# Patient Record
Sex: Female | Born: 1935 | Race: White | Hispanic: No | Marital: Married | Smoking: Former smoker
Health system: Southern US, Community
[De-identification: ages and names within clinical notes are randomized; demographics above are authoritative.]

## PROBLEM LIST (undated history)

## (undated) DIAGNOSIS — E039 Hypothyroidism, unspecified: Secondary | ICD-10-CM

## (undated) DIAGNOSIS — G971 Other reaction to spinal and lumbar puncture: Secondary | ICD-10-CM

## (undated) DIAGNOSIS — C801 Malignant (primary) neoplasm, unspecified: Secondary | ICD-10-CM

## (undated) DIAGNOSIS — R112 Nausea with vomiting, unspecified: Secondary | ICD-10-CM

## (undated) DIAGNOSIS — T4145XA Adverse effect of unspecified anesthetic, initial encounter: Secondary | ICD-10-CM

## (undated) DIAGNOSIS — I10 Essential (primary) hypertension: Secondary | ICD-10-CM

## (undated) DIAGNOSIS — T8859XA Other complications of anesthesia, initial encounter: Secondary | ICD-10-CM

## (undated) DIAGNOSIS — Z9889 Other specified postprocedural states: Secondary | ICD-10-CM

## (undated) DIAGNOSIS — G20A1 Parkinson's disease without dyskinesia, without mention of fluctuations: Secondary | ICD-10-CM

## (undated) DIAGNOSIS — I509 Heart failure, unspecified: Secondary | ICD-10-CM

## (undated) HISTORY — PX: APPENDECTOMY: SHX54

## (undated) HISTORY — PX: ABDOMINAL HYSTERECTOMY: SHX81

## (undated) HISTORY — PX: CHOLECYSTECTOMY: SHX55

## (undated) HISTORY — PX: TONSILLECTOMY: SUR1361

---

## 2001-09-02 HISTORY — PX: BREAST SURGERY: SHX581

## 2001-09-02 HISTORY — PX: FRACTURE SURGERY: SHX138

## 2012-06-09 ENCOUNTER — Ambulatory Visit: Payer: Self-pay | Admitting: Ophthalmology

## 2012-06-09 DIAGNOSIS — I10 Essential (primary) hypertension: Secondary | ICD-10-CM

## 2012-06-22 ENCOUNTER — Ambulatory Visit: Payer: Self-pay | Admitting: Ophthalmology

## 2012-08-07 ENCOUNTER — Ambulatory Visit: Payer: Self-pay | Admitting: Ophthalmology

## 2012-08-14 ENCOUNTER — Ambulatory Visit: Payer: Self-pay | Admitting: Ophthalmology

## 2012-09-02 HISTORY — PX: CATARACT EXTRACTION W/ INTRAOCULAR LENS IMPLANT: SHX1309

## 2012-09-02 HISTORY — PX: BACK SURGERY: SHX140

## 2013-12-31 ENCOUNTER — Observation Stay: Payer: Self-pay | Admitting: Surgery

## 2013-12-31 LAB — URINALYSIS, COMPLETE
BACTERIA: NONE SEEN
Bilirubin,UR: NEGATIVE
Blood: NEGATIVE
Glucose,UR: NEGATIVE mg/dL (ref 0–75)
Ketone: NEGATIVE
Leukocyte Esterase: NEGATIVE
NITRITE: NEGATIVE
PH: 7 (ref 4.5–8.0)
Protein: NEGATIVE
RBC,UR: 1 /HPF (ref 0–5)
SPECIFIC GRAVITY: 1.004 (ref 1.003–1.030)
SQUAMOUS EPITHELIAL: NONE SEEN

## 2013-12-31 LAB — COMPREHENSIVE METABOLIC PANEL
ALT: 28 U/L (ref 12–78)
Albumin: 4 g/dL (ref 3.4–5.0)
Alkaline Phosphatase: 65 U/L
Anion Gap: 3 — ABNORMAL LOW (ref 7–16)
BUN: 14 mg/dL (ref 7–18)
Bilirubin,Total: 1.1 mg/dL — ABNORMAL HIGH (ref 0.2–1.0)
CALCIUM: 8.9 mg/dL (ref 8.5–10.1)
CREATININE: 0.64 mg/dL (ref 0.60–1.30)
Chloride: 108 mmol/L — ABNORMAL HIGH (ref 98–107)
Co2: 30 mmol/L (ref 21–32)
EGFR (African American): >60
Glucose: 96 mg/dL (ref 65–99)
Osmolality: 282 (ref 275–301)
Potassium: 4 mmol/L (ref 3.5–5.1)
SGOT(AST): 31 U/L (ref 15–37)
Sodium: 141 mmol/L (ref 136–145)
Total Protein: 7.4 g/dL (ref 6.4–8.2)

## 2013-12-31 LAB — CBC WITH DIFFERENTIAL/PLATELET
Basophil #: 0 10*3/uL (ref 0.0–0.1)
Basophil %: 0.6 %
Eosinophil #: 0.2 10*3/uL (ref 0.0–0.7)
Eosinophil %: 3.1 %
HCT: 44.5 % (ref 35.0–47.0)
HGB: 14.9 g/dL (ref 12.0–16.0)
LYMPHS ABS: 1.2 10*3/uL (ref 1.0–3.6)
Lymphocyte %: 16.2 %
MCH: 31 pg (ref 26.0–34.0)
MCHC: 33.6 g/dL (ref 32.0–36.0)
MCV: 92 fL (ref 80–100)
MONO ABS: 0.7 x10 3/mm (ref 0.2–0.9)
Monocyte %: 8.7 %
Neutrophil #: 5.4 10*3/uL (ref 1.4–6.5)
Neutrophil %: 71.4 %
PLATELETS: 211 10*3/uL (ref 150–440)
RBC: 4.82 10*6/uL (ref 3.80–5.20)
RDW: 14.1 % (ref 11.5–14.5)
WBC: 7.5 10*3/uL (ref 3.6–11.0)

## 2013-12-31 LAB — TROPONIN I

## 2013-12-31 LAB — LIPASE, BLOOD: LIPASE: 163 U/L (ref 73–393)

## 2014-01-01 LAB — CBC WITH DIFFERENTIAL/PLATELET
BASOS PCT: 1.1 %
Basophil #: 0.1 10*3/uL (ref 0.0–0.1)
Eosinophil #: 0.3 10*3/uL (ref 0.0–0.7)
Eosinophil %: 3.8 %
HCT: 42.8 % (ref 35.0–47.0)
HGB: 13.9 g/dL (ref 12.0–16.0)
LYMPHS ABS: 1.5 10*3/uL (ref 1.0–3.6)
Lymphocyte %: 21.7 %
MCH: 30.2 pg (ref 26.0–34.0)
MCHC: 32.6 g/dL (ref 32.0–36.0)
MCV: 93 fL (ref 80–100)
MONO ABS: 0.7 x10 3/mm (ref 0.2–0.9)
Monocyte %: 10.7 %
NEUTROS ABS: 4.2 10*3/uL (ref 1.4–6.5)
Neutrophil %: 62.7 %
Platelet: 191 10*3/uL (ref 150–440)
RBC: 4.62 10*6/uL (ref 3.80–5.20)
RDW: 13.6 % (ref 11.5–14.5)
WBC: 6.8 10*3/uL (ref 3.6–11.0)

## 2014-01-11 DIAGNOSIS — K631 Perforation of intestine (nontraumatic): Secondary | ICD-10-CM | POA: Insufficient documentation

## 2014-12-20 NOTE — Op Note (Signed)
PATIENT NAME:  Susan Salas, Susan Salas MR#:  [id number] DATE OF BIRTH:  June 25, 1936  DATE OF PROCEDURE:  06/22/2012  PREOPERATIVE DIAGNOSIS:  Cataract, left eye.  POSTOPERATIVE DIAGNOSIS:  Cataract, left eye.  PROCEDURE PERFORMED:  Extracapsular cataract extraction using phacoemulsification with placement of an Alcon SN6CWS, 21.0-diopter posterior chamber lens, serial # [id number].  SURGEON:  Loura Back. Cilicia Borden, MD  ASSISTANT:  None.  ANESTHESIA:  4% lidocaine and 0.75% Marcaine in a 50/50 mixture with 10 units/mL of Hylenex added, given as a peribulbar.  ANESTHESIOLOGIST:  Dr. Andree Elk  COMPLICATIONS:  None.  ESTIMATED BLOOD LOSS:  Less than 1 mL.  DESCRIPTION OF PROCEDURE:  The patient was brought to the operating room and given a peribulbar block.  The patient was then prepped and draped in the usual fashion.  The vertical rectus muscles were imbricated using 5-0 silk sutures.  These sutures were then clamped to the sterile drapes as bridle sutures.  A limbal peritomy was performed extending two clock hours and hemostasis was obtained with cautery.  A partial thickness scleral groove was made at the surgical limbus and dissected anteriorly in a lamellar dissection using an Alcon crescent knife.  The anterior chamber was entered supero-temporally with a Superblade and through the lamellar dissection with a 2.6 mm keratome.  DisCoVisc was used to replace the aqueous and a continuous tear capsulorrhexis was carried out.  Hydrodissection and hydrodelineation were carried out with balanced salt and a 27 gauge canula.  The nucleus was rotated to confirm the effectiveness of the hydrodissection.  Phacoemulsification was carried out using a divide-and-conquer technique.  Total ultrasound time was 1 minute and 48 seconds with an average power of 14.6 percent. CDE 22.22.   Irrigation/aspiration was used to remove the residual cortex.  DisCoVisc was used to inflate the capsule and the internal incision  was enlarged to 3 mm with the crescent knife.  The intraocular lens was folded and inserted into the capsular bag using the AcrySert delivery system.  Irrigation/aspiration was used to remove the residual DisCoVisc.  Miostat was injected into the anterior chamber through the paracentesis track to inflate the anterior chamber and induce miosis.  The wound was checked for leaks and wound leakage was found.  Two sutures were placed with 10-0 across the incision, tied and the knot was rotated superiorly.  The conjunctiva was closed with cautery and the bridle sutures were removed.  Two drops of 0.3% Vigamox were placed on the eye.   An eye shield was placed on the eye.  The patient was discharged to the recovery room in good condition.  ____________________________ Loura Back Sloan Galentine, MD sad:cms D: 06/22/2012 13:34:17 ET T: 06/22/2012 13:45:54 ET JOB#: [id number]  cc: Remo Lipps A. Keaston Pile, MD, <Dictator>  Martie Lee MD ELECTRONICALLY SIGNED 06/29/2012 13:54

## 2014-12-20 NOTE — Op Note (Signed)
PATIENT NAME:  Susan Salas, Susan Salas MR#:  [id number] DATE OF BIRTH:  10-15-1935  DATE OF PROCEDURE:  08/14/2012  PREOPERATIVE DIAGNOSIS:  Cataract, right eye.   POSTOPERATIVE DIAGNOSIS:  Cataract, right eye.  PROCEDURE PERFORMED:  Extracapsular cataract extraction using phacoemulsification with placement of an Alcon SN6CWS, 23.5-diopter posterior chamber lens, serial # [id number].  SURGEON:  Loura Back. Kensy Blizard, MD  ASSISTANT:  None.  ANESTHESIA:  4% lidocaine and 0.75% Marcaine in a 50/50 mixture with 10 units/mL of Hylenex added, given as a peribulbar.  ANESTHESIOLOGIST:  Dr. Kayleen Memos.  COMPLICATIONS:  None.  ESTIMATED BLOOD LOSS:  Less than 1 mL.  DESCRIPTION OF PROCEDURE:  The patient was brought to the operating room and given a peribulbar block.  The patient was then prepped and draped in the usual fashion.  The vertical rectus muscles were imbricated using 5-0 silk sutures.  These sutures were then clamped to the sterile drapes as bridle sutures.  A limbal peritomy was performed extending two clock hours and hemostasis was obtained with cautery.  A partial thickness scleral groove was made at the surgical limbus and dissected anteriorly in a lamellar dissection using an Alcon crescent knife.  The anterior chamber was entered superonasally with a Superblade and through the lamellar dissection with a 2.6 mm keratome.  DisCoVisc was used to replace the aqueous and a continuous tear capsulorrhexis was carried out.  Hydrodissection and hydrodelineation were carried out with balanced salt and a 27 gauge canula.  The nucleus was rotated to confirm the effectiveness of the hydrodissection.  Phacoemulsification was carried out using a divide-and-conquer technique.  Total ultrasound time was 1 minute and 58 seconds with an average power of 14.3 percent. CDE 30.99.  Irrigation/aspiration was used to remove the residual cortex.  DisCoVisc was used to inflate the capsule and the internal incision  was enlarged to 3 mm with the crescent knife.  The intraocular lens was folded and inserted into the capsular bag using the AcrySert delivery system.  Irrigation/aspiration was used to remove the residual DisCoVisc.  Miostat was injected into the anterior chamber through the paracentesis track to inflate the anterior chamber and induce miosis.  The wound was checked for leaks and none were found. The conjunctiva was closed with cautery and the bridle sutures were removed.  Two drops of 0.3% Vigamox were placed on the eye.   An eye shield was placed on the eye.  The patient was discharged to the recovery room in good condition.  ____________________________ Loura Back Levana Minetti, MD sad:cms D: 08/14/2012 11:21:22 ET T: 08/14/2012 11:26:46 ET JOB#: [id number]  cc: Remo Lipps A. Crestina Strike, MD, <Dictator> Martie Lee MD ELECTRONICALLY SIGNED 08/17/2012 13:20

## 2014-12-24 NOTE — Discharge Summary (Signed)
PATIENT NAME:  Susan Salas, PORTNER MR#:  [id number] DATE OF BIRTH:  12-11-1935  DATE OF ADMISSION:  12/31/2013 DATE OF DISCHARGE:  01/03/2014  BRIEF HISTORY: Uzma Hellmer is a 79 year old woman seen in the Emergency Room with increased right lower quadrant pain. The patient has had chronic abdominal pain since she had multiple surgical procedures over the last several years. Prior to this admission, began to develop increasing right lower quadrant, right flank abdominal pain. She had no nausea, vomiting, constitutional symptoms, fever or change in bowel habits. Workup revealed a normal white blood cell count and normal liver function studies. CT scan with contrast revealed what appeared to be contrast outside the small bowel even in the low anterior mesentery. There was no free air. She did not have any peritoneal signs consistent with perforation. She did not have any antecedent event consistent with perforation, and she has had a previous appendectomy. She was admitted to the hospital and placed on IV rehydration, pain management. Had multiple subsequent followup examinations. She continued to have moderate abdominal pain. Her CT scan was repeated on the 2nd without evidence of any free air. She did have a slightly increasing fluid collection around the right colon consistent with perforation, although again no active source could be identified. We did not treat her with antibiotics. We converted her from IV pain medicine to p.o. pain medicine. She remained afebrile and had a normal white blood cell count. She was discharged home on the 4th to be followed in the office in 7 to 10 days' time. Followup the CT scan will be anticipated.   DISCHARGE MEDICATIONS: Include enteric aspirin 81 mg once a day, Cozaar 50 mg once a day, Synthroid 88 mcg once a day, Coreg 10 mg once a day, Lasix 40 mg once a day and acetaminophen/hydrocodone 325/5 every 4 to 6 hours p.r.n.    FINAL DISCHARGE DIAGNOSIS: Abdominal pain.    ____________________________ Micheline Maze, MD rle:gb D: 01/06/2014 20:00:12 ET T: 01/07/2014 03:45:53 ET JOB#: [id number]  cc: Rodena Goldmann III, MD, <Dictator> Rodena Goldmann MD ELECTRONICALLY SIGNED 01/19/2014 0:07

## 2014-12-24 NOTE — H&P (Signed)
PATIENT NAME:  Susan Salas, Susan Salas MR#:  [id number] DATE OF BIRTH:  1935-09-05  DATE OF ADMISSION:  12/31/2013  PRIMARY CARE PHYSICIAN: Jay: Dr. Pat Patrick.   CHIEF COMPLAINT: Right lower quadrant abdominal pain.   BRIEF HISTORY: Susan Salas is a 79 year old woman seen in the Emergency Room with 3 days of increasing right lower quadrant abdominal pain. She has a long-standing history of bilateral lower quadrant abdominal pain, but this pain is worsening and different. The pain is right lower quadrant and up under her right rib cage. The pain increased over the course of the day to where she was unable to tolerate it at home, and she presented to the Emergency Room for further evaluation.   She has a history of hysterectomy, oophorectomy, appendectomy, cholecystectomy, bladder sling, and cervical disk surgery. She had a colonoscopy 2 years ago, which was unremarkable. She saw her regular doctor, who identified a questionable pelvic mass in 2013. She was referred to the GYN oncology service at [facility]. They have been following that mass on subsequent CT scans since she was last evaluated there a year ago. That area has been stable without any significant changes, and the "mass" has disappeared. I do not have any CTs to compare with the current examination.   Currently, she denies any nausea, vomiting, diarrhea, or change in bowel habits. She ate lunch normally today. She denies any fever or chills. She has no other constitutional symptoms other than her abdominal pain. She has been up and active, not confined to bed.   Work-up in the Emergency Room revealed normal white blood cell count and normal electrolytes. CT scan with contrast revealed what appeared to be contrast outside the colon, cecum and small bowel, possibly even into the low anterior mesentery. There are some calcifications noted in that area, which could be contrast. However, there is no free fluid, no  evidence for any free air.   PAST MEDICAL HISTORY:  She denies a history of hepatitis, yellow jaundice, pancreatitis, peptic ulcer disease, or diverticulitis. She does have a history of some cardiac disease, although she denies palpitations or myocardial infarction. She relates that she has a "touch of heart failure." The etiology is unclear and has not been worked up to my knowledge. She has hypertension and hypothyroidism. She is not diabetic.   CURRENT MEDICATIONS: Include aspirin 81 mg p.o. daily, Coreg 10 mg once a day, Cozaar 50 mg once a day, Lasix 40 mg once a day, multivitamins, Synthroid 88 mcg once a day, and vitamin C.   ALLERGIES: SHE IS ALLERGIC TO GENERIC MEDICATIONS. SHE IS ALSO ALLERGIC TO FOSAMAX, BONIVA, MORPHINE, PHENYLEPHRINE, STATINS, AND SULFA DRUGS.   FAMILY HISTORY: Noncontributory.   REVIEW OF SYSTEMS: Otherwise unremarkable.   PHYSICAL EXAMINATION: GENERAL: She is an alert, pleasant, comfortable woman lying quietly at rest without significant pain at the present time. Most of her discomfort is in the right lower quadrant.  VITAL SIGNS: Blood pressure is 170/80. Heart rate is 68 and regular. Oxygen saturation is normal. She is afebrile.  HEENT: No scleral icterus. No pupillary abnormalities. No facial deformities.  NECK: Supple, nontender, with midline trachea and no adenopathy.  CHEST: Clear with no adventitious sounds, and she has normal pulmonary excursion.  CARDIAC: Reveals no murmurs or gallops to my ear. She seems to be in normal sinus rhythm.  ABDOMEN: Generally soft without any significant abdominal tenderness. Distracting her with conversation, she does not react to right lower quadrant palpation.  When asked about her pain, she points to the right lower quadrant and the right upper quadrant. She is not guarding, and there is no rebound. No hernias are noted. She has active bowel sounds.  EXTREMITIES: Lower extremity exam reveals full range of motion, no  significant deformities, and good distal pulses.  PSYCHIATRIC: Normal orientation, normal affect.   I independently reviewed her CT scan. There certainly is evidence of contrast density material outside the colon into the low anterior mesentery. There is no free air. There is no free fluid. She has some calcifications along her abdominal wall, which she relates to her previous surgery. I could not identify any pelvic masses. The omentum does appear thickened in that area.   ASSESSMENT AND PLAN: She certainly does not look like a bowel perforation, does not have any clinical presentation consistent with bowel perforation. She does not have any history of incident that would create a bowel perforation. She had a colonoscopy 2 years ago, so it unlikely she could grow a tumor in that period of time that might perforate her bowel. She had a previous appendectomy. She has no rebound, no guarding, no peritoneal signs. Her major complaint is increasing abdominal pain, but she does have a history of chronic pain on that side, although this current episode is worse. We will plan to admit her to the hospital for observation and pain control and see how she does over the next 24 hours. We will repeat her blood work and will repeat plain films looking for possible free air. At the present time, I do not see any surgical indications. I have discussed this plan with the patient and her husband in detail. Should surgery be considered, we may want to consider transfer to [facility] since all of her previous procedures have been done at [facility].   ____________________________ Micheline Maze, MD rle:jcm D: 12/31/2013 20:12:30 ET T: 12/31/2013 21:00:20 ET JOB#: [id number]  cc: Micheline Maze, MD, <Dictator> Rodena Goldmann MD ELECTRONICALLY SIGNED 01/01/2014 20:46

## 2015-04-10 DIAGNOSIS — M48062 Spinal stenosis, lumbar region with neurogenic claudication: Secondary | ICD-10-CM | POA: Insufficient documentation

## 2015-09-03 DIAGNOSIS — C801 Malignant (primary) neoplasm, unspecified: Secondary | ICD-10-CM

## 2015-09-03 HISTORY — PX: COLON SURGERY: SHX602

## 2015-09-03 HISTORY — DX: Malignant (primary) neoplasm, unspecified: C80.1

## 2015-12-13 ENCOUNTER — Ambulatory Visit
Admission: RE | Admit: 2015-12-13 | Discharge: 2015-12-13 | Disposition: A | Discharge status: Home / Self Care | Payer: Medicare Other | Source: Ambulatory Visit | Attending: Unknown Physician Specialty | Admitting: Unknown Physician Specialty

## 2015-12-13 ENCOUNTER — Other Ambulatory Visit: Payer: Self-pay | Admitting: Unknown Physician Specialty

## 2015-12-13 DIAGNOSIS — Z90710 Acquired absence of both cervix and uterus: Secondary | ICD-10-CM | POA: Insufficient documentation

## 2015-12-13 DIAGNOSIS — R1031 Right lower quadrant pain: Secondary | ICD-10-CM | POA: Insufficient documentation

## 2015-12-13 DIAGNOSIS — R933 Abnormal findings on diagnostic imaging of other parts of digestive tract: Secondary | ICD-10-CM | POA: Diagnosis not present

## 2015-12-13 DIAGNOSIS — Z9049 Acquired absence of other specified parts of digestive tract: Secondary | ICD-10-CM | POA: Diagnosis not present

## 2015-12-13 DIAGNOSIS — J9811 Atelectasis: Secondary | ICD-10-CM | POA: Diagnosis not present

## 2015-12-13 DIAGNOSIS — Z9889 Other specified postprocedural states: Secondary | ICD-10-CM | POA: Insufficient documentation

## 2015-12-13 HISTORY — DX: Heart failure, unspecified: I50.9

## 2015-12-13 HISTORY — DX: Essential (primary) hypertension: I10

## 2015-12-13 LAB — POCT I-STAT CREATININE: CREATININE: 0.7 mg/dL (ref 0.44–1.00)

## 2015-12-13 MED ORDER — IOPAMIDOL (ISOVUE-300) INJECTION 61%
100.0000 mL | Freq: Once | INTRAVENOUS | Status: AC | PRN
  Administered 2015-12-13: 100 mL via INTRAVENOUS

## 2016-10-03 DIAGNOSIS — Z79811 Long term (current) use of aromatase inhibitors: Secondary | ICD-10-CM | POA: Insufficient documentation

## 2017-04-28 ENCOUNTER — Encounter
Admission: RE | Admit: 2017-04-28 | Discharge: 2017-04-28 | Disposition: A | Discharge status: Home / Self Care | Payer: Medicare Other | Source: Ambulatory Visit | Attending: Podiatry | Admitting: Podiatry

## 2017-04-28 DIAGNOSIS — Z01818 Encounter for other preprocedural examination: Secondary | ICD-10-CM | POA: Insufficient documentation

## 2017-04-28 HISTORY — DX: Malignant (primary) neoplasm, unspecified: C80.1

## 2017-04-28 HISTORY — DX: Hypothyroidism, unspecified: E03.9

## 2017-04-28 HISTORY — DX: Other reaction to spinal and lumbar puncture: G97.1

## 2017-04-28 HISTORY — DX: Adverse effect of unspecified anesthetic, initial encounter: T41.45XA

## 2017-04-28 HISTORY — DX: Other complications of anesthesia, initial encounter: T88.59XA

## 2017-04-28 HISTORY — DX: Nausea with vomiting, unspecified: R11.2

## 2017-04-28 HISTORY — DX: Other specified postprocedural states: Z98.890

## 2017-04-28 NOTE — Pre-Procedure Instructions (Addendum)
Bo pre-op orders in chart or Epic for pt's PAT visit today.  Called Dr. Rosann Auerbach office and was told that pt has appt today and the orders will be faxed over as soon as they are ready..  CBC and Met B found in EPIC from [place] drawn earlier this month that can be used for upcoming surgery. EKG enclosed from PCP done on 04/11/2017 and comparison EKG from 9/2/2017can be used for upcoming surgery, no change noted on EKG done on 04/11/2017.  Patient remarked that she would like to avoid receiving general anesthesia due to nausea and vomiting afterwards.  Instead patient requests MAC and or block with local anesthesia.

## 2017-04-28 NOTE — Patient Instructions (Signed)
  Your procedure is scheduled on: Friday May 02, 2017. Report to Same Day Surgery. To find out your arrival time please call [phone] between 1PM - 3PM on Thursday May 01, 2017.  Remember: Instructions that are not followed completely may result in serious medical risk, up to and including death, or upon the discretion of your surgeon and anesthesiologist your surgery may need to be rescheduled.    _x___ 1. Do not eat food or drink liquids after midnight. No gum chewing or hard candies.  You can    Drink clear liquids (water, apple juice, gator aide, black coffee or black tea) up to 2 hours prior      To surgery.   ____ 2. No Alcohol for 24 hours before or after surgery.   ____ 3. Bring all medications with you on the day of surgery if instructed.    __x__ 4. Notify your doctor if there is any change in your medical condition     (cold, fever, infections).    _____ 5. No smoking 24 hours prior to surgery.     Do not wear jewelry, make-up, hairpins, clips or nail polish.  Do not wear lotions, powders, or perfumes.   Do not shave 48 hours prior to surgery. Men may shave face and neck.  Do not bring valuables to the hospital.    [facility] is not responsible for any belongings or valuables.               Contacts, dentures or bridgework may not be worn into surgery.  Leave your suitcase in the car. After surgery it may be brought to your room.  For patients admitted to the hospital, discharge time is determined by your treatment team.   Patients discharged the day of surgery will not be allowed to drive home.    Please read over the following fact sheets that you were given:   [facility] Preparing for Surgery  _x___ Take these medicines the morning of surgery with A SIP OF WATER:    1. SYNTHROID   ____ Fleet Enema (as directed)   __x__ Use CHG Soap as directed on instruction sheet  ____ Use inhalers on the day of surgery and bring to hospital day of  surgery  ____ Stop metformin 2 days prior to surgery    ____ Take 1/2 of usual insulin dose the night before surgery and none on the morning of surgery.   ____ Patient stopped aspirin on 04/27/2017.  _x__ Stop Anti-inflammatories such as Advil, Aleve, Ibuprofen, Motrin, Naproxen,  Naprosyn, Goodies powders or aspirin  products. OK to take Tylenol.   ____ Stop supplements until after surgery.    ____ Bring C-Pap to the hospital.

## 2017-04-29 ENCOUNTER — Other Ambulatory Visit: Payer: Self-pay | Admitting: Podiatry

## 2017-05-02 ENCOUNTER — Ambulatory Visit
Admission: RE | Admit: 2017-05-02 | Discharge: 2017-05-02 | Disposition: A | Discharge status: Home / Self Care | Payer: Medicare Other | Source: Ambulatory Visit | Attending: Podiatry | Admitting: Podiatry

## 2017-05-02 ENCOUNTER — Encounter: Payer: Self-pay | Admitting: *Deleted

## 2017-05-02 ENCOUNTER — Encounter
Admission: RE | Disposition: A | Discharge status: Home / Self Care | Payer: Self-pay | Source: Ambulatory Visit | Attending: Podiatry

## 2017-05-02 ENCOUNTER — Ambulatory Visit: Payer: Medicare Other | Admitting: Anesthesiology

## 2017-05-02 DIAGNOSIS — L97529 Non-pressure chronic ulcer of other part of left foot with unspecified severity: Secondary | ICD-10-CM | POA: Diagnosis not present

## 2017-05-02 DIAGNOSIS — Z8719 Personal history of other diseases of the digestive system: Secondary | ICD-10-CM | POA: Insufficient documentation

## 2017-05-02 DIAGNOSIS — Z85828 Personal history of other malignant neoplasm of skin: Secondary | ICD-10-CM | POA: Insufficient documentation

## 2017-05-02 DIAGNOSIS — I10 Essential (primary) hypertension: Secondary | ICD-10-CM | POA: Insufficient documentation

## 2017-05-02 DIAGNOSIS — E785 Hyperlipidemia, unspecified: Secondary | ICD-10-CM | POA: Diagnosis not present

## 2017-05-02 DIAGNOSIS — M48062 Spinal stenosis, lumbar region with neurogenic claudication: Secondary | ICD-10-CM | POA: Diagnosis not present

## 2017-05-02 DIAGNOSIS — Z7982 Long term (current) use of aspirin: Secondary | ICD-10-CM | POA: Diagnosis not present

## 2017-05-02 DIAGNOSIS — E034 Atrophy of thyroid (acquired): Secondary | ICD-10-CM | POA: Diagnosis not present

## 2017-05-02 DIAGNOSIS — Z888 Allergy status to other drugs, medicaments and biological substances status: Secondary | ICD-10-CM | POA: Diagnosis not present

## 2017-05-02 DIAGNOSIS — M810 Age-related osteoporosis without current pathological fracture: Secondary | ICD-10-CM | POA: Insufficient documentation

## 2017-05-02 DIAGNOSIS — M2042 Other hammer toe(s) (acquired), left foot: Secondary | ICD-10-CM | POA: Diagnosis present

## 2017-05-02 DIAGNOSIS — Z885 Allergy status to narcotic agent status: Secondary | ICD-10-CM | POA: Diagnosis not present

## 2017-05-02 DIAGNOSIS — Z882 Allergy status to sulfonamides status: Secondary | ICD-10-CM | POA: Insufficient documentation

## 2017-05-02 DIAGNOSIS — M479 Spondylosis, unspecified: Secondary | ICD-10-CM | POA: Diagnosis not present

## 2017-05-02 DIAGNOSIS — Z87891 Personal history of nicotine dependence: Secondary | ICD-10-CM | POA: Insufficient documentation

## 2017-05-02 DIAGNOSIS — Z79899 Other long term (current) drug therapy: Secondary | ICD-10-CM | POA: Insufficient documentation

## 2017-05-02 DIAGNOSIS — C481 Malignant neoplasm of specified parts of peritoneum: Secondary | ICD-10-CM | POA: Insufficient documentation

## 2017-05-02 DIAGNOSIS — Z90710 Acquired absence of both cervix and uterus: Secondary | ICD-10-CM | POA: Insufficient documentation

## 2017-05-02 HISTORY — PX: AMPUTATION TOE: SHX6595

## 2017-05-02 SURGERY — AMPUTATION, TOE
Anesthesia: General | Site: Foot | Laterality: Left | Wound class: Clean

## 2017-05-02 MED ORDER — CEFAZOLIN SODIUM-DEXTROSE 2-4 GM/100ML-% IV SOLN
2.0000 g | INTRAVENOUS | Status: AC
  Administered 2017-05-02: 2 g via INTRAVENOUS

## 2017-05-02 MED ORDER — LACTATED RINGERS IV SOLN
INTRAVENOUS | Status: DC
  Administered 2017-05-02: 09:00:00 via INTRAVENOUS

## 2017-05-02 MED ORDER — BUPIVACAINE HCL 0.5 % IJ SOLN
INTRAMUSCULAR | Status: DC | PRN
  Administered 2017-05-02: 5 mL

## 2017-05-02 MED ORDER — EPHEDRINE SULFATE 50 MG/ML IJ SOLN
INTRAMUSCULAR | Status: AC
  Filled 2017-05-02: qty 1

## 2017-05-02 MED ORDER — CEFAZOLIN SODIUM-DEXTROSE 2-4 GM/100ML-% IV SOLN
INTRAVENOUS | Status: AC
  Filled 2017-05-02: qty 100

## 2017-05-02 MED ORDER — FENTANYL CITRATE (PF) 100 MCG/2ML IJ SOLN
INTRAMUSCULAR | Status: AC
  Filled 2017-05-02: qty 2

## 2017-05-02 MED ORDER — PROPOFOL 10 MG/ML IV BOLUS
INTRAVENOUS | Status: DC | PRN
  Administered 2017-05-02: 20 mg via INTRAVENOUS

## 2017-05-02 MED ORDER — NEOMYCIN-POLYMYXIN B GU 40-200000 IR SOLN
Status: DC | PRN
  Administered 2017-05-02: 50 mL

## 2017-05-02 MED ORDER — PROPOFOL 500 MG/50ML IV EMUL
INTRAVENOUS | Status: AC
  Filled 2017-05-02: qty 50

## 2017-05-02 MED ORDER — NEOMYCIN-POLYMYXIN B GU 40-200000 IR SOLN
Status: AC
  Filled 2017-05-02: qty 1

## 2017-05-02 MED ORDER — FENTANYL CITRATE (PF) 100 MCG/2ML IJ SOLN
INTRAMUSCULAR | Status: DC | PRN
  Administered 2017-05-02 (×4): 25 ug via INTRAVENOUS

## 2017-05-02 MED ORDER — ONDANSETRON HCL 4 MG/2ML IJ SOLN
INTRAMUSCULAR | Status: DC | PRN
  Administered 2017-05-02: 4 mg via INTRAVENOUS

## 2017-05-02 MED ORDER — FAMOTIDINE 20 MG PO TABS
ORAL_TABLET | ORAL | Status: AC
  Administered 2017-05-02: 20 mg via ORAL
  Filled 2017-05-02: qty 1

## 2017-05-02 MED ORDER — PROPOFOL 10 MG/ML IV BOLUS
INTRAVENOUS | Status: AC
  Filled 2017-05-02: qty 20

## 2017-05-02 MED ORDER — FAMOTIDINE 20 MG PO TABS
20.0000 mg | ORAL_TABLET | Freq: Once | ORAL | Status: AC
  Administered 2017-05-02: 20 mg via ORAL

## 2017-05-02 MED ORDER — ONDANSETRON HCL 4 MG/2ML IJ SOLN
INTRAMUSCULAR | Status: AC
  Filled 2017-05-02: qty 2

## 2017-05-02 MED ORDER — TRAMADOL HCL 50 MG PO TABS: 50.0000 mg | ORAL_TABLET | Freq: Four times a day (QID) | ORAL | 0 refills | Status: AC | PRN

## 2017-05-02 MED ORDER — PROPOFOL 500 MG/50ML IV EMUL
INTRAVENOUS | Status: DC | PRN
  Administered 2017-05-02: 100 ug/kg/min via INTRAVENOUS

## 2017-05-02 MED ORDER — EPHEDRINE SULFATE 50 MG/ML IJ SOLN
INTRAMUSCULAR | Status: DC | PRN
  Administered 2017-05-02 (×2): 10 mg via INTRAVENOUS

## 2017-05-02 SURGICAL SUPPLY — 44 items
BANDAGE ACE 4X5 VEL STRL LF (GAUZE/BANDAGES/DRESSINGS) ×2 IMPLANT
BLADE MED AGGRESSIVE (BLADE) ×2 IMPLANT
BLADE OSC/SAGITTAL MD 5.5X18 (BLADE) ×2 IMPLANT
BLADE SURG 15 STRL LF DISP TIS (BLADE) ×2 IMPLANT
BLADE SURG 15 STRL SS (BLADE) ×2
BLADE SURG MINI STRL (BLADE) ×2 IMPLANT
BNDG ESMARK 4X12 TAN STRL LF (GAUZE/BANDAGES/DRESSINGS) ×2 IMPLANT
BNDG GAUZE 4.5X4.1 6PLY STRL (MISCELLANEOUS) ×2 IMPLANT
CANISTER SUCT 1200ML W/VALVE (MISCELLANEOUS) ×2 IMPLANT
CUFF TOURN 18 STER (MISCELLANEOUS) ×2 IMPLANT
CUFF TOURN DUAL PL 12 NO SLV (MISCELLANEOUS) ×2 IMPLANT
DRAPE FLUOR MINI C-ARM 54X84 (DRAPES) ×2 IMPLANT
DURAPREP 26ML APPLICATOR (WOUND CARE) ×2 IMPLANT
ELECT REM PT RETURN 9FT ADLT (ELECTROSURGICAL) ×2
ELECTRODE REM PT RTRN 9FT ADLT (ELECTROSURGICAL) ×1 IMPLANT
GAUZE PETRO XEROFOAM 1X8 (MISCELLANEOUS) ×2 IMPLANT
GAUZE SPONGE 4X4 12PLY STRL (GAUZE/BANDAGES/DRESSINGS) ×2 IMPLANT
GAUZE STRETCH 2X75IN STRL (MISCELLANEOUS) ×2 IMPLANT
GAUZE XEROFORM 4X4 STRL (GAUZE/BANDAGES/DRESSINGS) ×2 IMPLANT
GLOVE BIO SURGEON STRL SZ7.5 (GLOVE) ×2 IMPLANT
GLOVE INDICATOR 8.0 STRL GRN (GLOVE) ×2 IMPLANT
GOWN STRL REUS W/ TWL LRG LVL3 (GOWN DISPOSABLE) ×2 IMPLANT
GOWN STRL REUS W/TWL LRG LVL3 (GOWN DISPOSABLE) ×2
HANDPIECE VERSAJET DEBRIDEMENT (MISCELLANEOUS) ×2 IMPLANT
KIT RM TURNOVER STRD PROC AR (KITS) ×2 IMPLANT
LABEL OR SOLS (LABEL) ×2 IMPLANT
NEEDLE FILTER BLUNT 18X 1/2SAF (NEEDLE) ×1
NEEDLE FILTER BLUNT 18X1 1/2 (NEEDLE) ×1 IMPLANT
NEEDLE HYPO 25X1 1.5 SAFETY (NEEDLE) ×4 IMPLANT
NS IRRIG 500ML POUR BTL (IV SOLUTION) ×2 IMPLANT
PACK EXTREMITY ARMC (MISCELLANEOUS) ×2 IMPLANT
SOL .9 NS 3000ML IRR  AL (IV SOLUTION) ×1
SOL .9 NS 3000ML IRR UROMATIC (IV SOLUTION) ×1 IMPLANT
SOL PREP PVP 2OZ (MISCELLANEOUS) ×2
SOLUTION PREP PVP 2OZ (MISCELLANEOUS) ×1 IMPLANT
STOCKINETTE STRL 6IN 960660 (GAUZE/BANDAGES/DRESSINGS) ×2 IMPLANT
STRIP CLOSURE SKIN 1/4X4 (GAUZE/BANDAGES/DRESSINGS) ×2 IMPLANT
SUT ETHILON 3-0 FS-10 30 BLK (SUTURE) ×2
SUT ETHILON 4-0 (SUTURE)
SUT ETHILON 4-0 FS2 18XMFL BLK (SUTURE)
SUTURE EHLN 3-0 FS-10 30 BLK (SUTURE) ×1 IMPLANT
SUTURE ETHLN 4-0 FS2 18XMF BLK (SUTURE) IMPLANT
SWAB DUAL CULTURE TRANS RED ST (MISCELLANEOUS) ×2 IMPLANT
SYRINGE 10CC LL (SYRINGE) ×2 IMPLANT

## 2017-05-02 NOTE — Anesthesia Procedure Notes (Signed)
Date/Time: 05/02/2017 10:58 AM Performed by: Darlyne Russian Pre-anesthesia Checklist: Patient identified, Emergency Drugs available, Suction available, Patient being monitored and Timeout performed Patient Re-evaluated:Patient Re-evaluated prior to induction Oxygen Delivery Method: Nasal cannula Placement Confirmation: positive ETCO2

## 2017-05-02 NOTE — Op Note (Signed)
Date of operation: 05/02/2017.  Surgeon: Durward Fortes DPM.  Preoperative diagnosis: Chronic hammertoe with ulceration left fourth toe.  Postoperative diagnosis: Same.  Procedure: Distal amputation left fourth toe.  Anesthesia: Local Mac.  Hemostasis: Pneumatic tourniquet left ankle 250 mmHg.  Estimated blood loss: Less than 5 cc.  Pathology: Distal left fourth toe.  Complications: None apparent.  Operative indications: This is a 81 year old female with a chronic hammertoe causing ulcerations on the tip of her left toe. Conservative care has been unsuccessful and she elects for removal of the tip of her toe.  Operative procedure: Patient was taken to the operating room and placed on the table in the supine position. Following satisfactory sedation the left foot was anesthetized with 5 cc of 0.5% bupivacaine plain around the fourth toe and metatarsal area. A pneumatic tourniquet was applied at the level of the left ankle and the foot was prepped and draped in the usual sterile fashion. The foot was exsanguinated and the tourniquet inflated to 250 mmHg.    Attention was then directed to the distal aspect of the left fourth toe where a fishmouth type incision was made coursing medial to lateral just proximal to the nail plate area. The incision was carried sharply down to the level of the bone and periosteal dissection carried back to the level of the distal interphalangeal joint where the toe was disarticulated and removed in toto. There was noted to be a slight bony prominence along the lateral head of the middle phalanx and this was resected using a ronguer and rasped smooth. The wound was then flushed with copious amounts of sterile saline and closed using 4-0 nylon simple interrupted sutures area Xeroform and a sterile gauze bandage applied. Tourniquet was released. Kerlix and an Ace wrap applied. Patient tolerated the procedure and anesthesia well and was transported to the PACU with vital  signs stable and in good condition.

## 2017-05-02 NOTE — Anesthesia Preprocedure Evaluation (Signed)
Anesthesia Evaluation  Patient identified by MRN, date of birth, ID band Patient awake    Reviewed: Allergy & Precautions, NPO status , Patient's Chart, lab work & pertinent test results  History of Anesthesia Complications (+) PONV, POST - OP SPINAL HEADACHE and history of anesthetic complications  Airway Mallampati: II       Dental  (+) Upper Dentures   Pulmonary neg sleep apnea, neg COPD, former smoker,           Cardiovascular hypertension, Pt. on medications and Pt. on home beta blockers (-) Past MI (-) dysrhythmias (-) Valvular Problems/Murmurs     Neuro/Psych neg Seizures    GI/Hepatic Neg liver ROS, neg GERD  ,  Endo/Other  Hypothyroidism   Renal/GU negative Renal ROS     Musculoskeletal   Abdominal   Peds  Hematology   Anesthesia Other Findings   Reproductive/Obstetrics                             Anesthesia Physical Anesthesia Plan  ASA: III  Anesthesia Plan: General   Post-op Pain Management:    Induction:   PONV Risk Score and Plan: 3 and Ondansetron, Dexamethasone, Midazolam and Propofol infusion  Airway Management Planned: Nasal Cannula and Simple Face Mask  Additional Equipment:   Intra-op Plan:   Post-operative Plan:   Informed Consent: I have reviewed the patients History and Physical, chart, labs and discussed the procedure including the risks, benefits and alternatives for the proposed anesthesia with the patient or authorized representative who has indicated his/her understanding and acceptance.     Plan Discussed with:   Anesthesia Plan Comments:         Anesthesia Quick Evaluation

## 2017-05-02 NOTE — Discharge Instructions (Addendum)
1. Elevate left lower extremity on pillows.  2. Keep the bandage on the left foot clean, dry, and do not remove.  3. Sponge bathe only left lower extremity.  4. Wear surgical shoe on the left foot whenever walking or standing.  5. Take one pain pill, tramadol, every 6 hours as needed for pain.    AMBULATORY SURGERY  DISCHARGE INSTRUCTIONS   1) The drugs that you were given will stay in your system until tomorrow so for the next 24 hours you should not:  A) Drive an automobile B) Make any legal decisions C) Drink any alcoholic beverage   2) You may resume regular meals tomorrow.  Today it is better to start with liquids and gradually work up to solid foods.  You may eat anything you prefer, but it is better to start with liquids, then soup and crackers, and gradually work up to solid foods.   3) Please notify your doctor immediately if you have any unusual bleeding, trouble breathing, redness and pain at the surgery site, drainage, fever, or pain not relieved by medication.    4) Additional Instructions: TAKE A STOOL SOFTENER TWICE A DAY WHILE TAKING NARCOTIC PAIN MEDICINE TO PREVENT CONSTIPATION   Please contact your physician with any problems or Same Day Surgery at [phone], Monday through Friday 6 am to 4 pm, or [place] at [facility] number at [phone].

## 2017-05-02 NOTE — Anesthesia Post-op Follow-up Note (Signed)
Anesthesia QCDR form completed.        

## 2017-05-02 NOTE — H&P (Signed)
  Medical history and physical in the chart was reviewed.  Patient stable for surgery. 

## 2017-05-02 NOTE — Anesthesia Postprocedure Evaluation (Signed)
Anesthesia Post Note  Patient: Susan Salas  Procedure(s) Performed: Procedure(s) (LRB): AMPUTATION TOE/Left 4th mpj/28820 (Left)  Patient location during evaluation: PACU Anesthesia Type: General Level of consciousness: awake and alert Pain management: pain level controlled Vital Signs Assessment: post-procedure vital signs reviewed and stable Respiratory status: spontaneous breathing and respiratory function stable Cardiovascular status: blood pressure returned to baseline and stable Anesthetic complications: no     Last Vitals:  Vitals:   05/02/17 1205 05/02/17 1210  BP:  (!) 143/64  Pulse: 64 64  Resp: 14 15  Temp:    SpO2: 99% 100%    Last Pain:  Vitals:   05/02/17 1148  TempSrc: Temporal                 KEPHART,WILLIAM K

## 2017-05-02 NOTE — Interval H&P Note (Signed)
History and Physical Interval Note:  05/02/2017 9:48 AM  Buena Irish  has presented today for surgery, with the diagnosis of skin ulcer L97.521, hammertoe M20.42  The various methods of treatment have been discussed with the patient and family. After consideration of risks, benefits and other options for treatment, the patient has consented to  Procedure(s): AMPUTATION TOE/Left 4th mpj/28820 (Left) as a surgical intervention .  The patient's history has been reviewed, patient examined, no change in status, stable for surgery.  I have reviewed the patient's chart and labs.  Questions were answered to the patient's satisfaction.     Durward Fortes

## 2017-05-02 NOTE — Transfer of Care (Signed)
Immediate Anesthesia Transfer of Care Note  Patient: Susan Salas  Procedure(s) Performed: Procedure(s): AMPUTATION TOE/Left 4th mpj/28820 (Left)  Patient Location: PACU  Anesthesia Type:General  Level of Consciousness: awake, alert , oriented and patient cooperative  Airway & Oxygen Therapy: Patient Spontanous Breathing and Patient connected to nasal cannula oxygen  Post-op Assessment: Report given to RN and Post -op Vital signs reviewed and stable  Post vital signs: Reviewed and stable  Last Vitals:  Vitals:   05/02/17 1148 05/02/17 1155  BP: 139/72   Pulse: 68 64  Resp: 14 10  Temp: (!) 36 C   SpO2: 100% 100%    Last Pain:  Vitals:   05/02/17 1148  TempSrc: Temporal         Complications: No apparent anesthesia complications

## 2017-05-06 LAB — SURGICAL PATHOLOGY

## 2017-05-29 DIAGNOSIS — S98132A Complete traumatic amputation of one left lesser toe, initial encounter: Secondary | ICD-10-CM | POA: Insufficient documentation

## 2017-09-23 DIAGNOSIS — C481 Malignant neoplasm of specified parts of peritoneum: Secondary | ICD-10-CM | POA: Insufficient documentation

## 2017-12-03 ENCOUNTER — Other Ambulatory Visit: Payer: Self-pay | Admitting: Internal Medicine

## 2017-12-03 DIAGNOSIS — C481 Malignant neoplasm of specified parts of peritoneum: Secondary | ICD-10-CM

## 2017-12-03 DIAGNOSIS — R1031 Right lower quadrant pain: Secondary | ICD-10-CM

## 2017-12-17 ENCOUNTER — Ambulatory Visit
Admission: RE | Admit: 2017-12-17 | Discharge: 2017-12-17 | Disposition: A | Discharge status: Home / Self Care | Payer: Medicare Other | Source: Ambulatory Visit | Attending: Internal Medicine | Admitting: Internal Medicine

## 2017-12-17 DIAGNOSIS — C481 Malignant neoplasm of specified parts of peritoneum: Secondary | ICD-10-CM | POA: Diagnosis present

## 2017-12-17 DIAGNOSIS — Z8543 Personal history of malignant neoplasm of ovary: Secondary | ICD-10-CM | POA: Insufficient documentation

## 2017-12-17 DIAGNOSIS — R1031 Right lower quadrant pain: Secondary | ICD-10-CM | POA: Insufficient documentation

## 2017-12-17 MED ORDER — IOPAMIDOL (ISOVUE-300) INJECTION 61%: 100.0000 mL | Freq: Once | INTRAVENOUS | Status: DC | PRN

## 2017-12-17 MED ORDER — IOHEXOL 300 MG/ML  SOLN
100.0000 mL | Freq: Once | INTRAMUSCULAR | Status: AC | PRN
  Administered 2017-12-17: 100 mL via INTRAVENOUS

## 2018-09-09 ENCOUNTER — Other Ambulatory Visit: Payer: Self-pay | Admitting: Neurology

## 2018-09-09 DIAGNOSIS — R251 Tremor, unspecified: Secondary | ICD-10-CM

## 2018-09-19 ENCOUNTER — Ambulatory Visit: Payer: Medicare Other

## 2018-10-01 ENCOUNTER — Ambulatory Visit
Admission: RE | Admit: 2018-10-01 | Discharge: 2018-10-01 | Disposition: A | Discharge status: Home / Self Care | Payer: Medicare Other | Source: Ambulatory Visit | Attending: Neurology | Admitting: Neurology

## 2018-10-01 DIAGNOSIS — R251 Tremor, unspecified: Secondary | ICD-10-CM | POA: Insufficient documentation

## 2018-11-05 ENCOUNTER — Encounter: Payer: Self-pay | Admitting: Physical Therapy

## 2018-11-05 ENCOUNTER — Ambulatory Visit: Payer: Medicare Other | Attending: Neurology | Admitting: Physical Therapy

## 2018-11-05 ENCOUNTER — Other Ambulatory Visit: Payer: Self-pay

## 2018-11-05 DIAGNOSIS — R2689 Other abnormalities of gait and mobility: Secondary | ICD-10-CM

## 2018-11-05 DIAGNOSIS — R262 Difficulty in walking, not elsewhere classified: Secondary | ICD-10-CM | POA: Diagnosis present

## 2018-11-05 DIAGNOSIS — M6281 Muscle weakness (generalized): Secondary | ICD-10-CM | POA: Diagnosis present

## 2018-11-05 NOTE — Therapy (Signed)
[place] MAIN [facility] SERVICES [address] [place], [place], [address] Phone: [phone]   Fax:  [phone]  Physical Therapy Evaluation  Patient Details  Name: Susan Salas MRN: [id number] Date of Birth: Mar 14, 1936 Referring Provider (PT): shah   Encounter Date: 11/05/2018  PT End of Session - 11/05/18 1535    Visit Number  1    Number of Visits  17    Date for PT Re-Evaluation  12/31/18    PT Start Time  0315    PT Stop Time  0415    PT Time Calculation (min)  60 min    Activity Tolerance  Patient tolerated treatment well;Patient limited by fatigue    Behavior During Therapy  [facility] for tasks assessed/performed       Past Medical History:  Diagnosis Date  . Cancer ([place]) 2017   ovarian  . CHF (congestive heart failure) ([place])   . Complication of anesthesia    hard to wake from general anesthesia, long porcedures nausea and vomiting.  Marland Kitchen Hypertension   . Hypothyroidism   . PONV (postoperative nausea and vomiting)   . Spinal headache    with labor of 6 year old son.    Past Surgical History:  Procedure Laterality Date  . ABDOMINAL HYSTERECTOMY    . AMPUTATION TOE Left 05/02/2017   Procedure: AMPUTATION TOE/Left 4th mpj/28820;  Surgeon: Sharlotte Alamo, DPM;  Location: [facility] ORS;  Service: Podiatry;  Laterality: Left;  . APPENDECTOMY    . BACK SURGERY  2014   lamenectomy  . BREAST SURGERY Bilateral 2003   reduction  . CATARACT EXTRACTION W/ INTRAOCULAR LENS IMPLANT Bilateral 2014  . CHOLECYSTECTOMY    . COLON SURGERY  2017  . FRACTURE SURGERY Right 2003   screws & plates  . TONSILLECTOMY      There were no vitals filed for this visit.   Subjective Assessment - 11/05/18 1524    Subjective  Patient presents with unsteadiness and path deviation.with ambulation.     Patient is accompained by:  Family member    Pertinent History  Patient reports that she began having balance issues 6-7 months ago. She has ovarian cancer and her  joints hurt and she is having unsteadiness. Patient is living with her husband and has not had any falls . She is on a medicine that makes her bones weak. She goes every 3 months to the cancer MD and every 6 months she has a scan. She stopped walking due to path deviation. she has had back surgery 3 years ago.     How long can you stand comfortably?  unlimited    Patient Stated Goals  To be able to walk better.     Currently in Pain?  Yes    Pain Score  5     Pain Location  Hip    Pain Orientation  Right;Left    Pain Descriptors / Indicators  Aching    Pain Type  Chronic pain    Aggravating Factors   nothing    Pain Relieving Factors  nothing    Effect of Pain on Daily Activities  no    Multiple Pain Sites Yes intermittently but none now        [facility] PT Assessment - 11/05/18 1530      Assessment   Medical Diagnosis  balance deficit    Referring Provider (PT) Dr. Manuella Ghazi    Onset Date/Surgical Date  10/05/18    Hand Dominance  Right    Next MD Visit  shah, [place]    Prior Therapy  no      Precautions   Precautions  None      Restrictions   Weight Bearing Restrictions  No      Balance Screen   Has the patient fallen in the past 6 months  No    Has the patient had a decrease in activity level because of a fear of falling?   Yes    Is the patient reluctant to leave their home because of a fear of falling?   No      Home Environment   Living Environment  Private residence    Living Arrangements  Spouse/significant other    Available Help at Discharge  Family    Type of [place] Access  Level entry    [place]  None      Prior Function   Level of Independence  Independent;Independent with basic ADLs;Independent with household mobility without device    Vocation  Retired    Leisure  cards, read      Cognition   Overall Cognitive Status  Within Functional Limits for tasks assessed         PAIN: Patient has pain that is  intermittent and several joints; bilateral hips, thighs, calfs, arms, hands   POSTURE: WFL   PROM/AROM: WFL BUE and BLE  STRENGTH:  Graded on a 0-5 scale Muscle Group Left Right                          Hip Flex 3/5 3/5  Hip Abd 3/5 3/5  Hip Add 2/5 2/5  Hip Ext 2/5 2/5      Knee Flex 4/5 4/5  Knee Ext 4/5 4/5  Ankle DF 4/5 4/5  Ankle PF 4/5 4/5   SENSATION:  No reports of BUE and BLE numbness    FUNCTIONAL MOBILITY: slow and guarded supine to prone and prone to supine   BALANCE: Static Standing Balance  Normal Able to maintain standing balance against maximal resistance   Good Able to maintain standing balance against moderate resistance   Good-/Fair+ Able to maintain standing balance against minimal resistance x  Fair Able to stand unsupported without UE support and without LOB for 1-2 min   Fair- Requires Min A and UE support to maintain standing without loss of balance   Poor+ Requires mod A and UE support to maintain standing without loss of balance   Poor Requires max A and UE support to maintain standing balance without loss    Standing Dynamic Balance  Normal Stand independently unsupported, able to weight shift and cross midline maximally   Good Stand independently unsupported, able to weight shift and cross midline moderately x  Good-/Fair+ Stand independently unsupported, able to weight shift across midline minimally   Fair Stand independently unsupported, weight shift, and reach ipsilaterally, loss of balance when crossing midline   Poor+ Able to stand with Min A and reach ipsilaterally, unable to weight shift   Poor Able to stand with Mod A and minimally reach ipsilaterally, unable to cross midline.       GAIT   Patient ambulates without AD with decreased gait speed and path deviation with head turns  OUTCOME MEASURES: TEST Outcome Interpretation  5 times sit<>stand 21.17 sec >60 yo, >15 sec indicates increased risk for falls  10 meter walk test  .88             m/s <1.0 m/s indicates increased risk for falls; limited community ambulator  Timed up and Go  12.34               sec <14 sec indicates increased risk for falls  6 minute walk test      1080          Feet 1000 feet is community Conservator, museum/gallery Assessment 50/56 <36/56 (100% risk for falls), 37-45 (80% risk for falls); 46-51 (>50% risk for falls); 52-55 (lower risk <25% of falls)       Treatment: Instructed in HEP for corner balance exercise : tandem or modified tandem and head turns in the corner with chair in front for safety and 2- 3 mins x 2 x day         Objective measurements completed on examination: See above findings.              PT Education - 11/05/18 1534    Education Details  plan of care    Person(s) Educated  Patient    Methods  Explanation    Comprehension  Verbalized understanding;Returned demonstration;Need further instruction       PT Short Term Goals - 11/05/18 1639      PT SHORT TERM GOAL #1   Title  Patient will be independent in home exercise program to improve strength/mobility for better functional independence with ADLs.    Time  4    Period  Weeks    Status  New    Target Date  12/03/18      PT SHORT TERM GOAL #2   Title  Patient (> 62 years old) will complete five times sit to stand test in < 15 seconds indicating an increased LE strength and improved balance.    Time  4    Period  Weeks    Status  New    Target Date  12/03/18        PT Long Term Goals - 11/05/18 1647      PT LONG TERM GOAL #1   Title  Patient will increase BLE gross strength to 4+/5 as to improve functional strength for independent gait, increased standing tolerance and increased ADL ability.    Time  8    Period  Weeks    Status  New    Target Date  12/31/18      PT LONG TERM GOAL #2   Title  Patient will report a worst pain of 3/10 on VAS in  thighs           to improve tolerance with ADLs and reduced symptoms with activities.      Time  8    Period  Weeks    Status  New    Target Date  12/31/18      PT LONG TERM GOAL #3   Title  Patient will increase Berg Balance score by > 6 points to demonstrate decreased fall risk during functional activities.    Time  8    Period  Weeks    Status  New    Target Date  12/31/18      PT LONG TERM GOAL #4   Title  Patient will reduce timed up and go to <11 seconds to reduce fall risk and demonstrate improved transfer/gait ability    Time  8    Period  Weeks  Status  New    Target Date  12/31/18             Plan - 11/05/18 1612    Clinical Impression Statement  Patient presents with decreased dynamic standing balance , BLE hip and ankle weakness, decreased gait speed and decreased outcome measures that indicate a falls risk . She will benefit from skilled PT to improve strength and balance and decrease her falls risk.     Personal Factors and Comorbidities  Age;Comorbidity 1;Time since onset of injury/illness/exacerbation    Comorbidities   cancer    Examination-Activity Limitations  Stairs;Carry;Locomotion Level;Squat;Transfers    Examination-Participation Restrictions  Yard Work;Community Activity;Shop    Stability/Clinical Decision Making  Evolving/Moderate complexity    Clinical Decision Making  Moderate    Rehab Potential  Good    PT Frequency  2x / week    PT Duration  8 weeks    PT Treatment/Interventions  Patient/family education;Therapeutic exercise;Balance training;Therapeutic activities;Gait training;Aquatic Therapy    PT Next Visit Plan  neuromuscular re-education, therapeutic exercise    PT Home Exercise Plan  corner balancde    Consulted and Agree with Plan of Care  Patient;Family member/caregiver       Patient will benefit from skilled therapeutic intervention in order to improve the following deficits and impairments:  Pain, Decreased strength, Decreased activity tolerance, Difficulty walking, Decreased endurance, Abnormal gait, Decreased  balance, Decreased mobility, Dizziness  Visit Diagnosis: Other abnormalities of gait and mobility  Difficulty in walking, not elsewhere classified  Muscle weakness (generalized)     Problem List There are no active problems to display for this patient.   [address] , [place] DPT 11/05/2018, 4:52 PM  [place] MAIN [facility] SERVICES [address] [place], [place], [address] Phone: [phone]   Fax:  [phone]  Name: TAWNA ALWIN MRN: [id number] Date of Birth: 10-23-1935

## 2018-11-09 ENCOUNTER — Ambulatory Visit: Payer: Medicare Other | Admitting: Physical Therapy

## 2018-11-12 ENCOUNTER — Ambulatory Visit: Payer: Medicare Other | Admitting: Physical Therapy

## 2018-11-17 ENCOUNTER — Ambulatory Visit: Payer: Medicare Other | Admitting: Physical Therapy

## 2018-11-19 ENCOUNTER — Ambulatory Visit: Payer: Medicare Other | Admitting: Physical Therapy

## 2018-11-24 ENCOUNTER — Ambulatory Visit: Payer: Medicare Other | Admitting: Physical Therapy

## 2018-11-26 ENCOUNTER — Ambulatory Visit: Payer: Medicare Other | Admitting: Physical Therapy

## 2018-12-01 ENCOUNTER — Ambulatory Visit: Payer: Medicare Other | Admitting: Physical Therapy

## 2018-12-03 ENCOUNTER — Ambulatory Visit: Payer: Medicare Other | Admitting: Physical Therapy

## 2018-12-04 ENCOUNTER — Other Ambulatory Visit: Payer: Self-pay | Admitting: Obstetrics and Gynecology

## 2018-12-04 ENCOUNTER — Other Ambulatory Visit (HOSPITAL_COMMUNITY): Payer: Self-pay | Admitting: Obstetrics and Gynecology

## 2018-12-04 DIAGNOSIS — R109 Unspecified abdominal pain: Secondary | ICD-10-CM

## 2018-12-04 DIAGNOSIS — C569 Malignant neoplasm of unspecified ovary: Secondary | ICD-10-CM

## 2018-12-04 DIAGNOSIS — R911 Solitary pulmonary nodule: Secondary | ICD-10-CM

## 2018-12-08 ENCOUNTER — Ambulatory Visit: Payer: Medicare Other | Admitting: Physical Therapy

## 2018-12-10 ENCOUNTER — Ambulatory Visit: Payer: Medicare Other

## 2018-12-15 ENCOUNTER — Ambulatory Visit: Payer: Medicare Other | Admitting: Physical Therapy

## 2018-12-17 ENCOUNTER — Ambulatory Visit: Payer: Medicare Other | Admitting: Physical Therapy

## 2018-12-22 ENCOUNTER — Ambulatory Visit: Payer: Medicare Other

## 2018-12-24 ENCOUNTER — Ambulatory Visit: Payer: Medicare Other | Admitting: Physical Therapy

## 2018-12-29 ENCOUNTER — Ambulatory Visit
Admission: RE | Admit: 2018-12-29 | Discharge: 2018-12-29 | Disposition: A | Discharge status: Home / Self Care | Payer: Medicare Other | Source: Ambulatory Visit | Attending: Obstetrics and Gynecology | Admitting: Obstetrics and Gynecology

## 2018-12-29 ENCOUNTER — Ambulatory Visit: Payer: Medicare Other | Admitting: Physical Therapy

## 2018-12-29 ENCOUNTER — Other Ambulatory Visit: Payer: Self-pay

## 2018-12-29 DIAGNOSIS — C569 Malignant neoplasm of unspecified ovary: Secondary | ICD-10-CM | POA: Diagnosis not present

## 2018-12-29 DIAGNOSIS — R911 Solitary pulmonary nodule: Secondary | ICD-10-CM

## 2018-12-29 DIAGNOSIS — R109 Unspecified abdominal pain: Secondary | ICD-10-CM | POA: Insufficient documentation

## 2018-12-29 LAB — POCT I-STAT CREATININE: Creatinine, Ser: 0.6 mg/dL (ref 0.44–1.00)

## 2018-12-29 MED ORDER — IOHEXOL 300 MG/ML  SOLN
100.0000 mL | Freq: Once | INTRAMUSCULAR | Status: AC | PRN
  Administered 2018-12-29: 100 mL via INTRAVENOUS

## 2018-12-31 ENCOUNTER — Ambulatory Visit: Payer: Medicare Other | Admitting: Physical Therapy

## 2019-01-05 ENCOUNTER — Ambulatory Visit: Payer: Medicare Other | Admitting: Physical Therapy

## 2019-01-07 ENCOUNTER — Ambulatory Visit: Payer: Medicare Other | Admitting: Physical Therapy

## 2019-01-12 ENCOUNTER — Ambulatory Visit: Payer: Medicare Other | Admitting: Physical Therapy

## 2019-01-14 ENCOUNTER — Ambulatory Visit: Payer: Medicare Other | Admitting: Physical Therapy

## 2019-01-19 ENCOUNTER — Ambulatory Visit: Payer: Medicare Other | Admitting: Physical Therapy

## 2019-03-08 ENCOUNTER — Other Ambulatory Visit
Admission: RE | Admit: 2019-03-08 | Discharge: 2019-03-08 | Disposition: A | Discharge status: Home / Self Care | Payer: Medicare Other | Source: Ambulatory Visit | Attending: Ophthalmology | Admitting: Ophthalmology

## 2019-03-08 DIAGNOSIS — H02401 Unspecified ptosis of right eyelid: Secondary | ICD-10-CM | POA: Insufficient documentation

## 2019-03-12 LAB — ACETYLCHOLINE RECEPTOR AB, ALL
Acety choline binding ab: <0.03 nmol/L (ref 0.00–0.24)
Acetylchol Block Ab: 18 % (ref 0–25)
Acetylcholine Modulat Ab: <12 % (ref 0–20)

## 2019-03-17 DIAGNOSIS — G4733 Obstructive sleep apnea (adult) (pediatric): Secondary | ICD-10-CM | POA: Insufficient documentation

## 2019-06-16 ENCOUNTER — Ambulatory Visit: Payer: Medicare Other | Admitting: Physical Therapy

## 2019-06-22 ENCOUNTER — Ambulatory Visit: Payer: Medicare Other | Admitting: Physical Therapy

## 2019-06-28 ENCOUNTER — Ambulatory Visit: Payer: Medicare Other | Admitting: Physical Therapy

## 2019-06-30 ENCOUNTER — Ambulatory Visit: Payer: Medicare Other | Admitting: Physical Therapy

## 2019-07-05 ENCOUNTER — Ambulatory Visit: Payer: Medicare Other | Admitting: Physical Therapy

## 2019-07-07 ENCOUNTER — Ambulatory Visit: Payer: Medicare Other | Admitting: Physical Therapy

## 2019-07-12 ENCOUNTER — Ambulatory Visit: Payer: Medicare Other | Admitting: Physical Therapy

## 2019-07-14 ENCOUNTER — Ambulatory Visit: Payer: Medicare Other | Admitting: Physical Therapy

## 2019-07-19 ENCOUNTER — Ambulatory Visit: Payer: Medicare Other | Admitting: Physical Therapy

## 2019-07-21 ENCOUNTER — Ambulatory Visit: Payer: Medicare Other | Admitting: Physical Therapy

## 2019-07-26 ENCOUNTER — Ambulatory Visit: Payer: Medicare Other | Admitting: Physical Therapy

## 2019-07-28 ENCOUNTER — Ambulatory Visit: Payer: Medicare Other | Admitting: Physical Therapy

## 2019-08-02 ENCOUNTER — Ambulatory Visit: Payer: Medicare Other | Admitting: Physical Therapy

## 2019-08-04 ENCOUNTER — Ambulatory Visit: Payer: Medicare Other | Admitting: Physical Therapy

## 2019-08-09 ENCOUNTER — Ambulatory Visit: Payer: Medicare Other | Admitting: Physical Therapy

## 2019-08-11 ENCOUNTER — Ambulatory Visit: Payer: Medicare Other | Admitting: Physical Therapy

## 2019-09-08 ENCOUNTER — Ambulatory Visit: Payer: Medicare Other | Attending: Internal Medicine

## 2019-09-08 ENCOUNTER — Other Ambulatory Visit: Payer: Self-pay

## 2019-09-08 DIAGNOSIS — M25651 Stiffness of right hip, not elsewhere classified: Secondary | ICD-10-CM | POA: Diagnosis present

## 2019-09-08 DIAGNOSIS — R2689 Other abnormalities of gait and mobility: Secondary | ICD-10-CM | POA: Insufficient documentation

## 2019-09-08 DIAGNOSIS — R2681 Unsteadiness on feet: Secondary | ICD-10-CM | POA: Insufficient documentation

## 2019-09-08 DIAGNOSIS — M25551 Pain in right hip: Secondary | ICD-10-CM | POA: Diagnosis not present

## 2019-09-08 DIAGNOSIS — M6281 Muscle weakness (generalized): Secondary | ICD-10-CM | POA: Diagnosis present

## 2019-09-08 DIAGNOSIS — R262 Difficulty in walking, not elsewhere classified: Secondary | ICD-10-CM | POA: Insufficient documentation

## 2019-09-08 NOTE — Therapy (Signed)
[place] MAIN [facility] SERVICES [address] [place], [place], [address] Phone: [phone]   Fax:  [phone]  Physical Therapy Evaluation  Patient Details  Name: Susan Salas MRN: [id number] Date of Birth: 1936-02-13 Referring Provider (PT): Ramonita Lab, MD   Encounter Date: 09/08/2019  PT End of Session - 09/08/19 1551    Visit Number  1    Number of Visits  17    Date for PT Re-Evaluation  12/01/19    Authorization Type  Medicare    Authorization Time Period  09/08/19-12/01/19    Authorization - Visit Number  1    Authorization - Number of Visits  10    PT Start Time  [id number]    PT Stop Time  [id number]    PT Time Calculation (min)  60 min    Activity Tolerance  Patient tolerated treatment well;No increased pain    Behavior During Therapy  WFL for tasks assessed/performed       Past Medical History:  Diagnosis Date  . Cancer ([place]) 2017   ovarian  . CHF (congestive heart failure) ([place])   . Complication of anesthesia    hard to wake from general anesthesia, long porcedures nausea and vomiting.  Marland Kitchen Hypertension   . Hypothyroidism   . PONV (postoperative nausea and vomiting)   . Spinal headache    with labor of 55 year old son.    Past Surgical History:  Procedure Laterality Date  . ABDOMINAL HYSTERECTOMY    . AMPUTATION TOE Left 05/02/2017   Procedure: AMPUTATION TOE/Left 4th mpj/28820;  Surgeon: Sharlotte Alamo, DPM;  Location: [facility] ORS;  Service: Podiatry;  Laterality: Left;  . APPENDECTOMY    . BACK SURGERY  2014   lamenectomy  . BREAST SURGERY Bilateral 2003   reduction  . CATARACT EXTRACTION W/ INTRAOCULAR LENS IMPLANT Bilateral 2014  . CHOLECYSTECTOMY    . COLON SURGERY  2017  . FRACTURE SURGERY Right 2003   screws & plates  . TONSILLECTOMY      There were no vitals filed for this visit.   Subjective Assessment - 09/08/19 1450    Subjective  Pt presents to PT d/t progressive stiffness of right hip and decline in ambulatory  balance.    Pertinent History  Pt reports 3 years of pain in right hip (and global pain) since onset of estrogen blocking medication s/p CA. She reports >1 years of progressive decline in balance and progressive loss of Rt hip mobility, more recently having difficulty with Rt FABER posturing required for donning/doffing socks/shoes.    How long can you stand comfortably?  Unlimited but has worsening of low back pain and low back fatigue.    How long can you walk comfortably?  Estimated to be about 5 minutes (limited by SOB)    Currently in Pain?  No/denies         [facility] PT Assessment - 09/08/19 0001      Assessment   Medical Diagnosis  Right leg difficulty, weakness, poor stamina    Referring Provider (PT)  Ramonita Lab, MD    Onset Date/Surgical Date  --   2018   Hand Dominance  Right    Next MD Visit  non scheduled    Prior Therapy  Evaluated, but never seen d/t COVID      Precautions   Precautions  None      Restrictions   Weight Bearing Restrictions  No      Balance Screen  Has the patient fallen in the past 6 months  No    Has the patient had a decrease in activity level because of a fear of falling?   Yes    Is the patient reluctant to leave their home because of a fear of falling?   No      Home Environment   Living Environment  Private residence    Living Arrangements  Spouse/significant other    Available Help at Discharge  Family    Type of [place] Access  Level entry    [place]  None      Prior Function   Level of Independence  Independent;Independent with basic ADLs;Independent with household mobility without device    Vocation  Retired      Observation/Other Assessments   Focus on Therapeutic Outcomes (FOTO)   FOTO: 62 (38% impaired)       Sensation   Light Touch  --   Pt reports pedal paresthesias x20m, fluctuates s resolve   Additional Comments  numbness at right distal third of  anterior shin   mildly  reduced Left lower leg; tactile localization intact B     PROM   Right Hip Flexion  114    Right Hip External Rotation   23    Right Hip Internal Rotation   34    Right Hip ABduction  24    Left Hip Flexion  129    Left Hip External Rotation   50    Left Hip Internal Rotation   38    Left Hip ABduction  33      Strength   Right Hip Flexion  5/5   reduced ROM, delayed onset activation   Right Hip Extension  --   deferred to next visit   Right Hip External Rotation   4+/5    Right Hip Internal Rotation  4+/5    Right Hip ABduction  2+/5   horizontal ABDCT: 5/5, delayed onset activation   Right Hip ADduction  5/5   bradykinetic activation   Left Hip Flexion  5/5    Left Hip Extension  --   deferred to next visit   Left Hip External Rotation  5/5    Left Hip Internal Rotation  5/5    Left Hip ABduction  --   horizontal ABDCT: 5/5, delayed onset activation   Left Hip ADduction  5/5   bradykinetic activation   Right Knee Flexion  5/5   subjectively/objectively weaker   Right Knee Extension  4/5   slow activation   Left Knee Flexion  5/5    Left Knee Extension  5/5    Right Ankle Dorsiflexion  4/5   has plantarfascia pain with testing   Right Ankle Plantar Flexion  5/5   manually resisted open chain   Left Ankle Dorsiflexion  4+/5    Left Ankle Plantar Flexion  5/5   manually resisted open chain     Hip Scouring   Findings  Positive    Side  Right    Comments  Posterior joint pain Right; Pain free Left      Bed Mobility   Bed Mobility  Supine to Sit;Sit to Supine    Supine to Sit  Independent    Sit to Supine  Independent      Transfers   Five time sit to stand comments   15   21.17sec  at evaluation in March 2020     6 minute walk test results    Aerobic Endurance Distance Walked  --   deferred until next visit; 1046ft in March 2020     Standardized Balance Assessment   Five times sit to stand comments   15.00   hands-free; standard height   10 Meter Walk   9.45s (1.06 m/s)   11.38sec (0.34m/s) in March 2020     [place] comment:  --   Deferred this visit: 50/56 in March 2020         Objective measurements completed on examination: See above findings.     PT Education - 09/08/19 1550    Education Details  Result of examination; characteristics of Right hip OA and why additional medical workup is favorable.    Person(s) Educated  Patient    Methods  Explanation;Demonstration    Comprehension  Verbalized understanding;Returned demonstration;Need further instruction       PT Short Term Goals - 09/08/19 1607      PT SHORT TERM GOAL #1   Title  Patient will be independent in home exercise program to improve strength/mobility of Right hip for better functional independence with ADLs.    Baseline  no HEP    Time  4    Period  Weeks    Status  New    Target Date  10/06/19      PT SHORT TERM GOAL #2   Title  Patient will complete five times sit to stand test in < 13 seconds indicating an increased LE strength and improved balance.    Baseline  20sec March 2020; 15sec 09/08/19    Time  4    Period  Weeks    Status  New    Target Date  10/06/19      PT SHORT TERM GOAL #3   Title  Patient will increase Rt hip ABDCT to 3+/5; hip flexion, IR, ER to 5/5 (strong and equal to contralateral limb)    Time  4    Period  Weeks    Status  New    Target Date  10/06/19        PT Long Term Goals - 09/08/19 1609      PT LONG TERM GOAL #1   Title  Pt to demonstrate improved BLE power AEB 5xSTS <12sec    Baseline  15sec at eval 09/08/19    Time  8    Period  Weeks    Status  New    Target Date  11/03/19      PT LONG TERM GOAL #2   Title  Pt to demonstrate 6MWT >1357ft to improve ability to perfrom community distance AMB and IADL completion.    Baseline  ~1036ft in March 2020    Time  8    Period  Weeks    Status  New    Target Date  11/03/19      PT LONG TERM GOAL #3   Title  Pt to demonstrate 10MWT at >1.33m/s.     Baseline  0.13m/s 09/08/19    Time  8    Period  Weeks    Status  New    Target Date  11/03/19      PT LONG TERM GOAL #4   Title  Pt to demonstrate 5xSTS <11sec to demonstrate improved power in BLE    Baseline  15sec 09/08/19    Time  12  Period  Weeks    Status  New    Target Date  12/01/19      PT LONG TERM GOAL #5   Title  Pt to demonstrate SLS RLE >15sec to allow for improved gross motor control of the hip in the frontal plane for reduced falls risk and improved motor control in gait.    Time  12    Period  Weeks    Status  New    Target Date  12/01/19             Plan - 09/08/19 1553    Clinical Impression Statement  Pt presenting to OPPT for evaluation of progressive difficulty with RLE, most notably stiffness/weakness of the hip and recent progressive decline in confidence regarding balance. Examination revealing of capsular pattern of ROM loss in the Right hip (25-35%), diffiulty with right leg gross motor activation (bradykinesia), weakness of multiplanar hip muscles, and positive hip grind test, all characteristics of intraartiular hip pathology, consistent with osteoarthritis, however hip has not been medically evaluated, hence other etiology have not ben ruled out at this time. Given pt's ovaraian CA history and history of GI mets, author will discuss recommendation for medical imaging to PCP. Functionally pt has impairment in basic mobility including transfers and AMB AEB results in 10MWT, 5xSTS, however both of these are improved since March 2020, they remain below age matched norms for healthy subjects and continue to imply heightened risk of falls. Pt will benefit from skilled PT services to address deficits outline in this note in order to reduce pt falls risk, improve strength and mobility, and independence with basic mobility required for ADL/IADL.    Personal Factors and Comorbidities  Age;Comorbidity 1;Time since onset of injury/illness/exacerbation    Comorbidities   CA, osteoporosis    Examination-Activity Limitations  Stairs;Carry;Locomotion Level;Squat;Transfers;Dressing;Hygiene/Grooming    Examination-Participation Restrictions  Yard Work;Community Activity;Shop    Stability/Clinical Decision Making  Stable/Uncomplicated    Clinical Decision Making  Low    Rehab Potential  Good    PT Frequency  2x / week    PT Duration  12 weeks    PT Treatment/Interventions  Patient/family education;Therapeutic exercise;Balance training;Therapeutic activities;Gait training;ADLs/Self Care Home Management;Electrical Stimulation;Moist Heat;DME Instruction;Stair training;Functional mobility training;Taping;Passive range of motion;Dry needling;Joint Manipulations    PT Next Visit Plan  [place] progression balance screening; 6MWT; trial Rt hip joint mobilization/distraction; establish HEP for Right hip strength    PT Home Exercise Plan  deferred to visit 2    Consulted and Agree with Plan of Care  Patient       Patient will benefit from skilled therapeutic intervention in order to improve the following deficits and impairments:  Pain, Decreased strength, Decreased activity tolerance, Difficulty walking, Decreased endurance, Abnormal gait, Decreased balance, Decreased mobility, Dizziness  Visit Diagnosis: Pain in right hip  Stiffness of right hip, not elsewhere classified  Unsteadiness on feet     Problem List There are no problems to display for this patient.  4:19 PM, 09/08/19 Etta Grandchild, PT, DPT Physical Therapist - [facility]  Outpatient Physical [place] [phone]     Etta Grandchild 09/08/2019, 4:18 PM  [place] MAIN [facility] SERVICES [address] [place], [place], [address] Phone: [phone]   Fax:  [phone]  Name: Susan Salas MRN: [id number] Date of Birth: [birth year]

## 2019-09-14 ENCOUNTER — Ambulatory Visit: Payer: Medicare Other | Admitting: Physical Therapy

## 2019-09-14 ENCOUNTER — Encounter: Payer: Self-pay | Admitting: Physical Therapy

## 2019-09-14 ENCOUNTER — Other Ambulatory Visit: Payer: Self-pay

## 2019-09-14 DIAGNOSIS — R2681 Unsteadiness on feet: Secondary | ICD-10-CM

## 2019-09-14 DIAGNOSIS — M25551 Pain in right hip: Secondary | ICD-10-CM

## 2019-09-14 DIAGNOSIS — M25651 Stiffness of right hip, not elsewhere classified: Secondary | ICD-10-CM

## 2019-09-14 DIAGNOSIS — R262 Difficulty in walking, not elsewhere classified: Secondary | ICD-10-CM

## 2019-09-14 DIAGNOSIS — M6281 Muscle weakness (generalized): Secondary | ICD-10-CM

## 2019-09-14 DIAGNOSIS — R2689 Other abnormalities of gait and mobility: Secondary | ICD-10-CM

## 2019-09-14 NOTE — Patient Instructions (Signed)
Access Code: [email or web address]  URL: [email or web address]  Date: 09/14/2019  Prepared by: Blanche East   Exercises  Supine Lower Trunk Rotation - 10 reps - 2 sets - 1x daily - 7x weekly  Single Knee to Chest Stretch - 3 reps - 2 sets - 20 sec hold - 1x daily - 7x weekly  Supine Active Straight Leg Raise - 10 reps - 2 sets - 1x daily - 7x weekly  Seated March - 10 reps - 2 sets - 1x daily - 7x weekly  Seated Hip Abduction with Resistance - 10 reps - 2 sets - 1x daily - 7x weekly

## 2019-09-14 NOTE — Therapy (Signed)
[place] MAIN [facility] SERVICES [address] [place], [place], [address] Phone: [phone]   Fax:  [phone]  Physical Therapy Treatment  Patient Details  Name: Susan Salas MRN: [id number] Date of Birth: [birth year] Referring Provider (PT): Ramonita Lab, MD   Encounter Date: 09/14/2019  PT End of Session - 09/14/19 1420    Visit Number  2    Number of Visits  17    Date for PT Re-Evaluation  12/01/19    Authorization Type  Medicare    Authorization Time Period  09/08/19-12/01/19    Authorization - Visit Number  2    Authorization - Number of Visits  10    PT Start Time  [id number]    PT Stop Time  1415    PT Time Calculation (min)  40 min    Activity Tolerance  Patient tolerated treatment well;No increased pain    Behavior During Therapy  WFL for tasks assessed/performed       Past Medical History:  Diagnosis Date  . 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 CHF (congestive heart failure) ([place])   . Complication of anesthesia    hard to wake from general anesthesia, long porcedures nausea and vomiting.  Marland Kitchen Hypertension   . Hypothyroidism   . PONV (postoperative nausea and vomiting)   . Spinal headache    with labor of 82 year old son.    Past Surgical History:  Procedure Laterality Date  . ABDOMINAL HYSTERECTOMY    . AMPUTATION TOE Left 05/02/2017   Procedure: AMPUTATION TOE/Left 4th mpj/28820;  Surgeon: Sharlotte Alamo, DPM;  Location: [facility] ORS;  Service: Podiatry;  Laterality: Left;  . APPENDECTOMY    . BACK SURGERY  2014   lamenectomy  . BREAST SURGERY Bilateral 2003   reduction  . CATARACT EXTRACTION W/ INTRAOCULAR LENS IMPLANT Bilateral 2014  . CHOLECYSTECTOMY    . COLON SURGERY  2017  . FRACTURE SURGERY Right 2003   screws & plates  . TONSILLECTOMY      There were no vitals filed for this visit.  Subjective Assessment - 09/14/19 1340    Subjective  Patient reports doing okay. She denies any pain currently. She reports intermittent  right hip pain which is in the groin and worse at end of the day.    Pertinent History  Pt reports 3 years of pain in right hip (and global pain) since onset of estrogen blocking medication s/p CA. She reports >1 years of progressive decline in balance and progressive loss of Rt hip mobility, more recently having difficulty with Rt FABER posturing required for donning/doffing socks/shoes.    How long can you stand comfortably?  Unlimited but has worsening of low back pain and low back fatigue.    How long can you walk comfortably?  Estimated to be about 5 minutes (limited by SOB)    Currently in Pain?  No/denies           TREATMENT: Patient hooklying: -lumbar trunk rotation x1 min bilaterally with min VCs to avoid painful ROM;  Single knee to chest stretch 20 sec hold x2 reps bilaterally;  Hooklying with green tband around BLE: -hip flexion march 2x10 -hip abduction/ER 2x10 Patient required min-moderate verbal/tactile cues for correct exercise technique including cues to increase AROM to tolerance for better strengthening;   SLR hip flexion x10 bilaterally; Patient had increased difficulty with RLE after 5 repetitions reporting increased pain/discomfort; discontinued  PT provided written HEP for better adherence,  see patient instructions;   Manual therapy:  PT performed long axis distraction to RLE: 20 sec pull, 10 sec rest x8 min; Patient reports good stretch and immediate relief of pain;  PT performed passive RLE single knee to chest stretch 20 sec hold x2 reps; PT performed passive RLE piriformis stretch (modified) 20 sec hold x2 reps; Patient tolerated well with moderate stretch reported but no increase in pain;  PT performed passive RLE circles x5 reps to facilitate better hip flexibility; She reports mild discomfort in groin with increased ROM  Response to treatment: Patient tolerated advanced exercises fair. She does report increased groin discomfort with end range hip  flexion. This was relieved with long axis distraction. Patient reports no pain at end of session and states that she felt better following treatment session;                      PT Education - 09/14/19 1420    Education Details  LE strengthening, HEP    Person(s) Educated  Patient    Methods  Explanation;Verbal cues    Comprehension  Verbalized understanding;Returned demonstration;Verbal cues required;Need further instruction       PT Short Term Goals - 09/08/19 1607      PT SHORT TERM GOAL #1   Title  Patient will be independent in home exercise program to improve strength/mobility of Right hip for better functional independence with ADLs.    Baseline  no HEP    Time  4    Period  Weeks    Status  New    Target Date  10/06/19      PT SHORT TERM GOAL #2   Title  Patient will complete five times sit to stand test in < 13 seconds indicating an increased LE strength and improved balance.    Baseline  20sec March 2020; 15sec 09/08/19    Time  4    Period  Weeks    Status  New    Target Date  10/06/19      PT SHORT TERM GOAL #3   Title  Patient will increase Rt hip ABDCT to 3+/5; hip flexion, IR, ER to 5/5 (strong and equal to contralateral limb)    Time  4    Period  Weeks    Status  New    Target Date  10/06/19        PT Long Term Goals - 09/08/19 1609      PT LONG TERM GOAL #1   Title  Pt to demonstrate improved BLE power AEB 5xSTS <12sec    Baseline  15sec at eval 09/08/19    Time  8    Period  Weeks    Status  New    Target Date  11/03/19      PT LONG TERM GOAL #2   Title  Pt to demonstrate 6MWT >1317ft to improve ability to perfrom community distance AMB and IADL completion.    Baseline  ~1058ft in March 2020    Time  8    Period  Weeks    Status  New    Target Date  11/03/19      PT LONG TERM GOAL #3   Title  Pt to demonstrate 10MWT at >1.49m/s.    Baseline  0.75m/s 09/08/19    Time  8    Period  Weeks    Status  New    Target Date  11/03/19  PT LONG TERM GOAL #4   Title  Pt to demonstrate 5xSTS <11sec to demonstrate improved power in BLE    Baseline  15sec 09/08/19    Time  12    Period  Weeks    Status  New    Target Date  12/01/19      PT LONG TERM GOAL #5   Title  Pt to demonstrate SLS RLE >15sec to allow for improved gross motor control of the hip in the frontal plane for reduced falls risk and improved motor control in gait.    Time  12    Period  Weeks    Status  New    Target Date  12/01/19            Plan - 09/14/19 1420    Clinical Impression Statement  Patient motivated and participated well within session. Instructed patient in LE strengthening and flexibility exercise. She does require min VCs for proper positioning and exercise technique for optimal muscle activation. PT performed long axis distraction with good tolerance. Patient able to exhibit improved AROM and reports less pain at end of session. She would benefit from additional skilled PT Intervention to improve strength, balance and mobility;    Personal Factors and Comorbidities  Age;Comorbidity 1;Time since onset of injury/illness/exacerbation    Comorbidities  CA, osteoporosis    Examination-Activity Limitations  Stairs;Carry;Locomotion Level;Squat;Transfers;Dressing;Hygiene/Grooming    Examination-Participation Restrictions  Yard Work;Community Activity;Shop    Stability/Clinical Decision Making  Stable/Uncomplicated    Rehab Potential  Good    PT Frequency  2x / week    PT Duration  12 weeks    PT Treatment/Interventions  Patient/family education;Therapeutic exercise;Balance training;Therapeutic activities;Gait training;ADLs/Self Care Home Management;Electrical Stimulation;Moist Heat;DME Instruction;Stair training;Functional mobility training;Taping;Passive range of motion;Dry needling;Joint Manipulations    PT Next Visit Plan  Rhomberg progression balance screening; 6MWT; trial Rt hip joint mobilization/distraction; establish HEP for Right  hip strength    PT Home Exercise Plan  deferred to visit 2    Consulted and Agree with Plan of Care  Patient       Patient will benefit from skilled therapeutic intervention in order to improve the following deficits and impairments:  Pain, Decreased strength, Decreased activity tolerance, Difficulty walking, Decreased endurance, Abnormal gait, Decreased balance, Decreased mobility, Dizziness  Visit Diagnosis: Pain in right hip  Stiffness of right hip, not elsewhere classified  Unsteadiness on feet  Other abnormalities of gait and mobility  Difficulty in walking, not elsewhere classified  Muscle weakness (generalized)     Problem List There are no problems to display for this patient.   Ankush Gintz,Azalyn PT, DPT 09/14/2019, 2:25 PM  [place] MAIN [facility] SERVICES [address] [place], [place], [address] Phone: [phone]   Fax:  [phone]  Name: MINAAL BOMMER MRN: [id number] Date of Birth: 10-27-1935

## 2019-09-16 ENCOUNTER — Ambulatory Visit: Payer: Medicare Other | Admitting: Physical Therapy

## 2019-09-21 ENCOUNTER — Ambulatory Visit: Payer: Medicare Other | Admitting: Physical Therapy

## 2019-09-23 ENCOUNTER — Ambulatory Visit: Payer: Medicare Other | Admitting: Physical Therapy

## 2019-09-28 ENCOUNTER — Ambulatory Visit: Payer: Medicare Other | Admitting: Physical Therapy

## 2019-09-30 ENCOUNTER — Ambulatory Visit: Payer: Medicare Other | Admitting: Physical Therapy

## 2019-10-05 ENCOUNTER — Ambulatory Visit: Payer: Medicare Other | Admitting: Physical Therapy

## 2019-10-07 ENCOUNTER — Ambulatory Visit: Payer: Medicare Other | Admitting: Physical Therapy

## 2019-10-12 ENCOUNTER — Ambulatory Visit: Payer: Medicare Other | Admitting: Physical Therapy

## 2019-10-14 ENCOUNTER — Ambulatory Visit: Payer: Medicare Other | Admitting: Physical Therapy

## 2019-10-19 ENCOUNTER — Ambulatory Visit: Payer: Medicare Other | Admitting: Physical Therapy

## 2019-10-21 ENCOUNTER — Ambulatory Visit: Payer: Medicare Other | Admitting: Physical Therapy

## 2019-10-26 ENCOUNTER — Ambulatory Visit: Payer: Medicare Other | Admitting: Physical Therapy

## 2019-10-28 ENCOUNTER — Ambulatory Visit: Payer: Medicare Other | Admitting: Physical Therapy

## 2019-10-29 ENCOUNTER — Other Ambulatory Visit: Payer: Self-pay | Admitting: Obstetrics and Gynecology

## 2019-10-29 DIAGNOSIS — C569 Malignant neoplasm of unspecified ovary: Secondary | ICD-10-CM

## 2019-11-02 ENCOUNTER — Ambulatory Visit: Payer: Medicare Other | Admitting: Physical Therapy

## 2019-11-03 ENCOUNTER — Ambulatory Visit
Admission: RE | Admit: 2019-11-03 | Discharge: 2019-11-03 | Disposition: A | Discharge status: Home / Self Care | Payer: Medicare Other | Source: Ambulatory Visit | Attending: Obstetrics and Gynecology | Admitting: Obstetrics and Gynecology

## 2019-11-03 ENCOUNTER — Other Ambulatory Visit: Payer: Self-pay

## 2019-11-03 DIAGNOSIS — C569 Malignant neoplasm of unspecified ovary: Secondary | ICD-10-CM | POA: Diagnosis not present

## 2019-11-09 ENCOUNTER — Ambulatory Visit: Payer: Medicare Other | Admitting: Physical Therapy

## 2019-11-11 ENCOUNTER — Ambulatory Visit: Payer: Medicare Other | Admitting: Physical Therapy

## 2019-11-16 ENCOUNTER — Ambulatory Visit: Payer: Medicare Other | Admitting: Physical Therapy

## 2019-11-18 ENCOUNTER — Ambulatory Visit: Payer: Medicare Other | Admitting: Physical Therapy

## 2019-11-23 ENCOUNTER — Ambulatory Visit: Payer: Medicare Other | Admitting: Physical Therapy

## 2019-11-25 ENCOUNTER — Ambulatory Visit: Payer: Medicare Other | Admitting: Physical Therapy

## 2019-11-30 ENCOUNTER — Ambulatory Visit: Payer: Medicare Other | Admitting: Physical Therapy

## 2019-12-02 ENCOUNTER — Ambulatory Visit: Payer: Medicare Other | Admitting: Physical Therapy

## 2020-01-24 DIAGNOSIS — G609 Hereditary and idiopathic neuropathy, unspecified: Secondary | ICD-10-CM | POA: Insufficient documentation

## 2020-02-16 ENCOUNTER — Other Ambulatory Visit: Payer: Self-pay

## 2020-02-16 ENCOUNTER — Ambulatory Visit: Payer: Medicare Other | Attending: Internal Medicine

## 2020-02-16 DIAGNOSIS — R2689 Other abnormalities of gait and mobility: Secondary | ICD-10-CM | POA: Diagnosis present

## 2020-02-16 DIAGNOSIS — R2681 Unsteadiness on feet: Secondary | ICD-10-CM | POA: Diagnosis not present

## 2020-02-16 NOTE — Therapy (Addendum)
[place] MAIN [facility] SERVICES [address] [place], [place], [address] Phone: [phone]   Fax:  [phone]  Physical Therapy Evaluation  Patient Details  Name: Susan Salas MRN: [id number] Date of Birth: [birth year] Referring Provider (PT): Ramonita Lab, MD   Encounter Date: 02/16/2020   PT End of Session - 02/16/20 1329    Visit Number 1    Number of Visits 17    Date for PT Re-Evaluation 04/12/20    Authorization Type Evaluation: 02/16/20, FOTO completed    PT Start Time 1308    PT Stop Time 1400    PT Time Calculation (min) 52 min    Equipment Utilized During Treatment Gait belt    Activity Tolerance Patient tolerated treatment well    Behavior During Therapy [facility] for tasks assessed/performed           Past Medical History:  Diagnosis Date  . Cancer ([place]) 2017   ovarian  . CHF (congestive heart failure) ([place])   . Complication of anesthesia    hard to wake from general anesthesia, long porcedures nausea and vomiting.  Marland Kitchen Hypertension   . Hypothyroidism   . PONV (postoperative nausea and vomiting)   . Spinal headache    with labor of 43 year old son.    Past Surgical History:  Procedure Laterality Date  . ABDOMINAL HYSTERECTOMY    . AMPUTATION TOE Left 05/02/2017   Procedure: AMPUTATION TOE/Left 4th mpj/28820;  Surgeon: Sharlotte Alamo, DPM;  Location: [facility] ORS;  Service: Podiatry;  Laterality: Left;  . APPENDECTOMY    . BACK SURGERY  2014   lamenectomy  . BREAST SURGERY Bilateral 2003   reduction  . CATARACT EXTRACTION W/ INTRAOCULAR LENS IMPLANT Bilateral 2014  . CHOLECYSTECTOMY    . COLON SURGERY  2017  . FRACTURE SURGERY Right 2003   screws & plates  . TONSILLECTOMY      There were no vitals filed for this visit.    Subjective Assessment - 02/16/20 1308    Subjective Unsteadiness/imbalance    Pertinent History Pt reports bilateral LE weakness and difficulty with her balance. She has a follow-up appointment with  Dr. Manuella Ghazi in the coming weeks. She has a history of laminectomy approximately 4 years ago in "my entire back." She reports severe muscle pain in BLE when walking but worse on the R side. She complains of "swelling in the top of both legs or maybe its just fat." Per note from Dr. 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 Continues to follow with oncology. She is on letrozole (nonsteroidal aromatase inhibitors), with CA-125 monitored periodically. Has not been able to be quite as active over the [place] pandemic. She was previously seen in this clinic earlier in 2021 for R hip pain. Per PT note at that time pt with 3 years of pain in right hip (and global pain) since onset of estrogen blocking medication s/p CA diagnosis.    How long can you stand comfortably? Unlimited but has worsening of low back pain and low back fatigue.    How long can you walk comfortably? Estimated to be about 1-2 minutes (limited by leg pain)    Diagnostic tests Pt believes that lumbar MRI was approximately 3 years ago    Patient Stated Goals Pt would  like to be able to walk farther    Currently in Pain? No/denies    Pain Score --   Worst: 10/10, Best: 0/10   Pain Location Back    Pain Orientation Right;Left;Lower    Pain Descriptors / Indicators Sharp;Constant    Pain Type Chronic pain    Pain Radiating Towards Into bilateral thighs down to the feet, R first and second toe numbness, cramping in thights    Pain Onset More than a month ago    Pain Frequency Intermittent    Aggravating Factors  Walking, standing, transfers from supine to sitting    Pain Relieving Factors sitting down, "that cream on my legs," laying down    Multiple Pain Sites No              [facility] PT Assessment  - 02/16/20 1323      Assessment   Medical Diagnosis Idiopathic peripheral neuropathy, OA of spine with radiculopathy    Referring Provider (PT) Ramonita Lab, MD    Onset Date/Surgical Date --   2018, approximate   Hand Dominance Right    Next MD Visit Upcoming appointment with neurology    Prior Therapy Pt seen for a few visits early in 2021 but D/C due to concerns over possible blood clot but testing negative      Precautions   Precautions Fall      Restrictions   Weight Bearing Restrictions No      Balance Screen   Has the patient fallen in the past 6 months No    Has the patient had a decrease in activity level because of a fear of falling?  Yes    Is the patient reluctant to leave their home because of a fear of falling?  No      Home Social worker Private residence    Living Arrangements Spouse/significant other    Available Help at Discharge Family    Type of [place] Access Level entry    Sellers - single point;Shower seat - built in;Grab bars - tub/shower;Grab bars - toilet      Prior Function   Level of Independence Independent with basic ADLs;Independent with household mobility without device    Vocation Retired    [employer], plays cards twice/week, go out to eat, used to dance      Cognition   Overall Cognitive Status Within Functional Limits for tasks assessed      Observation/Other Assessments   Focus on Therapeutic Outcomes (FOTO)  55      Sensation   Light Touch --    Additional Comments --      PROM   Right Hip Flexion --                Standardized Balance Assessment   Standardized Balance Assessment Berg Balance Test    Five times sit to stand comments  --    10 Meter Walk --      Berg Balance Test   Sit to Stand Able to stand without using hands and stabilize independently    Standing Unsupported Able to stand safely 2 minutes      Sitting with Back Unsupported but Feet Supported on Floor or Stool Able to sit safely and securely 2 minutes    Stand to Sit Sits safely with minimal use of hands    Transfers Able  to transfer safely, minor use of hands    Standing Unsupported with Eyes Closed Able to stand 10 seconds with supervision    Standing Unsupported with Feet Together Able to place feet together independently and stand 1 minute safely    From Standing, Reach Forward with Outstretched Arm Can reach confidently >25 cm (10")    From Standing Position, Pick up Object from Floor Able to pick up shoe safely and easily    From Standing Position, Turn to Look Behind Over each Shoulder Looks behind from both sides and weight shifts well    Turn 360 Degrees Able to turn 360 degrees safely in 4 seconds or less    Standing Unsupported, Alternately Place Feet on Step/Stool Able to stand independently and safely and complete 8 steps in 20 seconds    Standing Unsupported, One Foot in Front Able to plae foot ahead of the other independently and hold 30 seconds    Standing on One Leg Tries to lift leg/unable to hold 3 seconds but remains standing independently    Total Score 51    Berg comment: --             SUBJECTIVE Chief complaint: unsteadiness and leg pain Onset: Pt reports bilateral LE weakness and difficulty with her balance. She has a follow-up appointment with Dr. Manuella Ghazi in the coming weeks. She has a history of laminectomy approximately 4 years ago in "my entire back." She reports severe muscle pain in BLE when walking but worse on the R side. She complains of "swelling in the top of both legs or maybe its just fat." Per note from Dr. 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  Continues to follow with oncology. She is on letrozole (nonsteroidal aromatase inhibitors), with CA-125 monitored periodically. Has not been able to be quite as active over the [place] pandemic. She was previously seen in this clinic earlier in 2021 for R hip pain. Per PT note at that time pt with 3 years of pain in right hip (and global pain) since onset of estrogen blocking medication s/p CA diagnosis.    OBJECTIVE  MUSCULOSKELETAL: Tremor: Bilateral hands, worse on the R side Bulk: Normal Tone: Normal, no clonus  Posture Forward upper back posture with rounded shoulders and forward head  Gait Decreased step length and self-selected speed. Decreased heel strike noted bilaterally  Strength R/L 4-/4 Hip flexion 4-/4- Hip abduction (seated) 4-/4- Hip adduction (seated) 5/5 Knee extension 5/5 Knee flexion 5/5 Ankle Plantarflexion 5/5 Ankle Dorsiflexion 4/4 Shoulder flexion 4/4 Shoulder abduction 4/4 Elbow flexion 4-/4- Elbow extension Decreased grip strength bilaterally;  NEUROLOGICAL:  Mental Status Patient is oriented to person, place and time.  Recent memory is intact.  Remote memory is intact.  Attention span and concentration are intact.  Expressive speech is intact.  Patient's fund of knowledge is within normal limits for educational level.  Cranial Nerves Visual acuity and visual fields are intact  Extraocular muscles are intact  Facial strength is intact bilaterally  Hearing is normal as tested by gross conversation Shoulder shrug strength is intact   Sensation Decreased RLE sensation at L4 and L5 demartomes. Otherwise grossly intact to light touch bilateral UEs/LEs C2-T2/L2-S2 respectively Proprioception and hot/cold testing deferred on this date.  Reflexes Deferred  Coordination/Cerebellar Deferred   FUNCTIONAL OUTCOME  MEASURES   Results Comments  BERG 51/56 Fall risk, in need of intervention  TUG 9.2 seconds WNL  5TSTS 19.8 seconds Fall risk, in  need of intervention  10 Meter Gait Speed Self-selected: 10.9s = 0.92 m/s; Fastest: 8.9s = 1.12 m/s Self-selected speed below normative values for full community ambulation  ABC Scale 41.9% Low balance confidence  FOTO 55 Predicted improvement to 64     POSTURAL CONTROL TESTS   Modified Clinical Test of Sensory Interaction for Balance    (CTSIB): Deferred   OCULOMOTOR / VESTIBULAR TESTING: Deferred       Objective measurements completed on examination: See above findings.       PT Education - 02/16/20 1329    Education Details Plan of care    Person(s) Educated Patient    Methods Explanation    Comprehension Verbalized understanding            PT Short Term Goals - 02/16/20 1331      PT SHORT TERM GOAL #1   Title Patient will be independent in home exercise program to improve strength, mobility, and balance in order to improve function at home    Baseline --    Time 4    Period Weeks    Status New    Target Date 03/15/20                 PT Long Term Goals - 02/16/20 1429      PT LONG TERM GOAL #1   Title Pt will improve BERG by at least 3 points in order to demonstrate clinically significant improvement in balance.    Baseline 02/16/20: 51/56    Time 8    Period Weeks    Status New    Target Date 04/12/20      PT LONG TERM GOAL #2   Title Pt will improve ABC by at least 13% in order to demonstrate clinically significant improvement in balance confidence.    Baseline 02/16/20: 41.9%    Time 8    Period Weeks    Status New    Target Date 04/12/20      PT LONG TERM GOAL #3   Title Pt will decrease 5TSTS by at least 3 seconds in order to demonstrate clinically significant improvement in LE strength.    Baseline 02/16/20: 19.8s    Time 8    Period Weeks    Status New    Target Date 04/12/20      PT LONG TERM GOAL #4   Title Pt will improve FOTO to at least 64 in order to demonstrate clinically significant improvement in function at home.     Baseline 02/16/20: 55    Time 8    Period Weeks    Status New    Target Date 04/12/20                  Plan - 02/16/20 1330    Clinical Impression Statement Pt is a pleasant 84 year-old female referred for difficulty with balance as well as lumbar OA with radiculopathy. Pt reports that she would like to focus on her balance and leg weakness as it is the most limiting for her. She has an upcoming appointment with neurology and plans to discuss her low back pain. PT examination reveals deficits in balance as identified by BERG of 51/56, self selected gait speed as 0.92 m/s, and 5TSTS of 19.8s. She has low balance confidence with ABC of 41.9%. Will further  investigate her low back OA and radiculopathy at upcoming visits as pt desires. Pt presents with deficits in strength, gait and balance. She will benefit from skilled PT services to address these deficits to decrease risk for falls and improve her function at home.    Personal Factors and Comorbidities Age;Time since onset of injury/illness/exacerbation;Comorbidity 3+    Comorbidities CA, osteoporosis, lumbar stenosis, hypothyroid    Examination-Activity Limitations Stairs;Carry;Locomotion Level;Squat;Transfers;Stand    Examination-Participation Restrictions Yard Work;Community Activity;Shop    Stability/Clinical Decision Making Evolving/Moderate complexity    Clinical Decision Making Moderate    Rehab Potential Fair    PT Frequency 2x / week    PT Duration 8 weeks    PT Treatment/Interventions Patient/family education;Therapeutic exercise;Balance training;Therapeutic activities;Gait training;ADLs/Self Care Home Management;Electrical Stimulation;Moist Heat;DME Instruction;Stair training;Functional mobility training;Taping;Passive range of motion;Dry needling;Joint Manipulations;Aquatic Therapy;Biofeedback;Canalith Repostioning;Cryotherapy;Iontophoresis 4mg /ml Dexamethasone;Neuromuscular re-education;Manual techniques;Vestibular    PT Next  Visit Plan Consider Two or Six Minute Walk Test, initiate LE strengthening and balance activities, at some point address back pain    PT Home Exercise Plan Deferred to next visit    Consulted and Agree with Plan of Care Patient           Patient will benefit from skilled therapeutic intervention in order to improve the following deficits and impairments:  Pain, Decreased strength, Difficulty walking, Abnormal gait, Decreased balance, Decreased activity tolerance  Visit Diagnosis: Unsteadiness on feet - Plan: PT plan of care cert/re-cert  Other abnormalities of gait and mobility - Plan: PT plan of care cert/re-cert     Problem List There are no problems to display for this patient.  Phillips Grout PT, DPT, GCS  Jennings Stirling 02/16/2020, 3:41 PM  [place] MAIN [facility] SERVICES [address] [place], [place], [address] Phone: [phone]   Fax:  [phone]  Name: Susan Salas MRN: [id number] Date of Birth: 05/11/1936

## 2020-02-22 ENCOUNTER — Ambulatory Visit: Payer: Medicare Other

## 2020-02-22 ENCOUNTER — Other Ambulatory Visit: Payer: Self-pay

## 2020-02-22 DIAGNOSIS — R2681 Unsteadiness on feet: Secondary | ICD-10-CM

## 2020-02-22 DIAGNOSIS — R2689 Other abnormalities of gait and mobility: Secondary | ICD-10-CM

## 2020-02-22 NOTE — Therapy (Signed)
[place] MAIN [facility] SERVICES [address] [place], [place], [address] Phone: [phone]   Fax:  [phone]  Physical Therapy Treatment  Patient Details  Name: Susan Salas MRN: [id number] Date of Birth: 01/04/1936 Referring Provider (PT): Ramonita Lab, MD   Encounter Date: 02/22/2020   PT End of Session - 02/22/20 1546    Visit Number 2    Number of Visits 17    Date for PT Re-Evaluation 04/12/20    Authorization Type Evaluation: 02/16/20, FOTO completed    Authorization Time Period 09/08/19-12/01/19    Authorization - Visit Number 3    Authorization - Number of Visits 10    PT Start Time 1536    PT Stop Time 6962    PT Time Calculation (min) 38 min    Equipment Utilized During Treatment Gait belt    Activity Tolerance Patient tolerated treatment well;No increased pain;Patient limited by fatigue    Behavior During Therapy [facility] for tasks assessed/performed           Past Medical History:  Diagnosis Date  . 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 CHF (congestive heart failure) ([place])   . Complication of anesthesia    hard to wake from general anesthesia, long porcedures nausea and vomiting.  Marland Kitchen Hypertension   . Hypothyroidism   . PONV (postoperative nausea and vomiting)   . Spinal headache    with labor of 26 year old son.    Past Surgical History:  Procedure Laterality Date  . ABDOMINAL HYSTERECTOMY    . AMPUTATION TOE Left 05/02/2017   Procedure: AMPUTATION TOE/Left 4th mpj/28820;  Surgeon: Sharlotte Alamo, DPM;  Location: [facility] ORS;  Service: Podiatry;  Laterality: Left;  . APPENDECTOMY    . BACK SURGERY  2014   lamenectomy  . BREAST SURGERY Bilateral 2003   reduction  . CATARACT EXTRACTION W/ INTRAOCULAR LENS IMPLANT Bilateral 2014  . CHOLECYSTECTOMY    . COLON SURGERY  2017  . FRACTURE SURGERY Right 2003   screws & plates  . TONSILLECTOMY      There were no vitals filed for this visit.   Subjective Assessment - 02/22/20 1542      Subjective Pt returns for treatment after recent evaluation. Pt reports no updates since prior session, but did have a new medication to help with pain processsing.    Pertinent History Pt reports bilateral LE weakness and difficulty with her balance. She has a follow-up appointment with Dr. Manuella Ghazi in the coming weeks. She has a history of laminectomy approximately 4 years ago in "my entire back." She reports severe muscle pain in BLE when walking but worse on the R side. She complains of "swelling in the top of both legs or maybe its just fat." Per note from Dr. 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 Continues to follow with oncology. She is on letrozole (nonsteroidal aromatase inhibitors), with CA-125 monitored periodically. Has not been able to be quite as active over the [place] pandemic. She was previously seen in this clinic earlier in 2021 for R hip pain. Per PT note at that time pt with 3 years of pain in right hip (and global pain) since onset of estrogen blocking medication s/p CA diagnosis.    How long can you stand comfortably? Unlimited  but has worsening of low back pain and low back fatigue.    How long can you walk comfortably? Estimated to be about 1-2 minutes (limited by leg pain)    Diagnostic tests Pt believes that lumbar MRI was approximately 3 years ago    Patient Stated Goals Pt would like to be able to walk farther    Currently in Pain? Yes    Pain Score 8     Pain Location --   upper legs         INTERVENTION THIS DATE: -NUSTEP for cardiovascular conditioning 8 minutes, seat 9, arms 9, level 2, SPM cued >80 *Terminal HR: 67bpm, 98%    [facility] Adult PT Treatment/Exercise - 02/22/20 0001      Ambulation/Gait   Ambulation Distance  (Feet) 893 Feet   6MWT   Assistive device None    Gait velocity 0.53m/s    Gait Comments Terminal vitals: 98%SpO2, 72 BPM    Pt reports significant reduction in pain after AMB         HEP exploration, discovery, and assignment:  -STS from chair, 10x hands-free; (effort appears too high, also quads and patella pain)  -STS from chair + airex pad 10x hands free (moderate effort, no pain) -Standing Heel raises in tandem 1x15 bilat, BUE supported  -Semi-Tandem stance balance 2x15sec bilat, hands free in //bars        PT Short Term Goals - 02/16/20 1331      PT SHORT TERM GOAL #1   Title Patient will be independent in home exercise program to improve strength, mobility, and balance in order to improve function at home    Baseline --    Time 4    Period Weeks    Status New    Target Date 03/15/20      PT SHORT TERM GOAL #2   Title --    Baseline --    Time --    Period --    Status --    Target Date --      PT SHORT TERM GOAL #3   Title --    Time --    Period --    Status --    Target Date --             PT Long Term Goals - 02/16/20 1429      PT LONG TERM GOAL #1   Title Pt will improve BERG by at least 3 points in order to demonstrate clinically significant improvement in balance.    Baseline 02/16/20: 51/56    Time 8    Period Weeks    Status New    Target Date 04/12/20      PT LONG TERM GOAL #2   Title Pt will improve ABC by at least 13% in order to demonstrate clinically significant improvement in balance confidence.    Baseline 02/16/20: 41.9%    Time 8    Period Weeks    Status New    Target Date 04/12/20      PT LONG TERM GOAL #3   Title Pt will decrease 5TSTS by at least 3 seconds in order to demonstrate clinically significant improvement in LE strength.    Baseline 02/16/20: 19.8s    Time 8    Period Weeks    Status New    Target Date 04/12/20      PT LONG TERM GOAL #4   Title Pt will improve FOTO to at least 64 in order  to demonstrate  clinically significant improvement in function at home.    Baseline 02/16/20: 55    Time 8    Period Weeks    Status New    Target Date 04/12/20                 Plan - 02/22/20 1547    Clinical Impression Statement Began to establish pt's plan of care to improve BLE strength, dynamic balance, and AMB activity tolerance. Pt arrives with 8/10 pain in legs, but has decrease to 2/10 after Nustep and 6MWT. Pt is noted to have bradycardia in 60s after 8 minutes on Nustep, then low 70s after 6MWT, no current medications to explain this, although it may be her baseline. Of note pt also has some mild flat affect in session, as well as decreased amplitude of trunk movement in gait during walking. Pt has mild tremor in hand, but only noticeable when holding her HEP as the paper amplifies small movements. Pt does well with balance activity in // bars, attests to capacity to perform in a safe context at home.     Personal Factors and Comorbidities Age;Time since onset of injury/illness/exacerbation;Comorbidity 3+    Comorbidities CA, osteoporosis, lumbar stenosis, hypothyroid    Examination-Activity Limitations Stairs;Carry;Locomotion Level;Squat;Transfers;Stand    Examination-Participation Restrictions Yard Work;Community Activity;Shop    Stability/Clinical Decision Making Evolving/Moderate complexity    Clinical Decision Making Moderate    Rehab Potential Fair    PT Frequency 2x / week    PT Duration 8 weeks    PT Treatment/Interventions Patient/family education;Therapeutic exercise;Balance training;Therapeutic activities;Gait training;ADLs/Self Care Home Management;Electrical Stimulation;Moist Heat;DME Instruction;Stair training;Functional mobility training;Taping;Passive range of motion;Dry needling;Joint Manipulations;Aquatic Therapy;Biofeedback;Canalith Repostioning;Cryotherapy;Iontophoresis 4mg /ml Dexamethasone;Neuromuscular re-education;Manual techniques;Vestibular    PT Next Visit Plan  Continue with cardiovascular conditioning, leg strengthening    PT Home Exercise Plan Chair squats, semitandem stance    Consulted and Agree with Plan of Care Patient           Patient will benefit from skilled therapeutic intervention in order to improve the following deficits and impairments:  Pain, Decreased strength, Difficulty walking, Abnormal gait, Decreased balance, Decreased activity tolerance  Visit Diagnosis: Unsteadiness on feet  Other abnormalities of gait and mobility     Problem List There are no problems to display for this patient.  6:09 PM, 02/22/20 Etta Grandchild, PT, DPT Physical Therapist - [place] [phone]     Etta Grandchild 02/22/2020, 5:56 PM  [place] MAIN [facility] SERVICES [address] [place], [place], [address] Phone: [phone]   Fax:  [phone]  Name: Susan Salas MRN: [id number] Date of Birth: 1936/02/05

## 2020-02-24 ENCOUNTER — Other Ambulatory Visit: Payer: Self-pay

## 2020-02-24 ENCOUNTER — Ambulatory Visit: Payer: Medicare Other

## 2020-02-24 DIAGNOSIS — R2681 Unsteadiness on feet: Secondary | ICD-10-CM

## 2020-02-24 DIAGNOSIS — R2689 Other abnormalities of gait and mobility: Secondary | ICD-10-CM

## 2020-02-24 NOTE — Therapy (Signed)
[place] MAIN [facility] SERVICES [address] [place], [place], [address] Phone: [phone]   Fax:  [phone]  Physical Therapy Treatment  Patient Details  Name: Susan Salas MRN: [id number] Date of Birth: [birth year] Referring Provider (PT): Ramonita Lab, MD   Encounter Date: 02/24/2020   PT End of Session - 02/24/20 1533    Visit Number 3    Number of Visits 17    Date for PT Re-Evaluation 04/12/20    Authorization Type Evaluation: 02/16/20, FOTO completed    Authorization Time Period 02/16/20-04/12/20    PT Start Time 1529   Pt arrived late   PT Stop Time 1615    PT Time Calculation (min) 46 min    Equipment Utilized During Treatment Gait belt    Activity Tolerance Patient tolerated treatment well;No increased pain;Patient limited by fatigue    Behavior During Therapy [facility] for tasks assessed/performed           Past Medical History:  Diagnosis Date  . Cancer ([place]) 2017   ovarian  . CHF (congestive heart failure) ([place])   . Complication of anesthesia    hard to wake from general anesthesia, long porcedures nausea and vomiting.  Marland Kitchen Hypertension   . Hypothyroidism   . PONV (postoperative nausea and vomiting)   . Spinal headache    with labor of 61 year old son.    Past Surgical History:  Procedure Laterality Date  . ABDOMINAL HYSTERECTOMY    . AMPUTATION TOE Left 05/02/2017   Procedure: AMPUTATION TOE/Left 4th mpj/28820;  Surgeon: Sharlotte Alamo, DPM;  Location: [facility] ORS;  Service: Podiatry;  Laterality: Left;  . APPENDECTOMY    . BACK SURGERY  2014   lamenectomy  . BREAST SURGERY Bilateral 2003   reduction  . CATARACT EXTRACTION W/ INTRAOCULAR LENS IMPLANT Bilateral 2014  . CHOLECYSTECTOMY    . COLON SURGERY  2017  . FRACTURE SURGERY Right 2003   screws & plates  . TONSILLECTOMY      There were no vitals filed for this visit.   Subjective Assessment - 02/24/20 1531    Subjective Pt doing ok today, repors she had 'quite a  morning.' She endorses some improvement in achiness after last session, but did ave increase achiness the next day, today back to baseline. Pt reports her 'essntial tremor' is aggravated today, much worse than typical, unclear why.    Pertinent History Pt reports bilateral LE weakness and difficulty with her balance. She has a follow-up appointment with Dr. Manuella Ghazi in the coming weeks. She has a history of laminectomy approximately 4 years ago in "my entire back." She reports severe muscle pain in BLE when walking but worse on the R side. She complains of "swelling in the top of both legs or maybe its just fat." Per note from Dr. 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 Continues to follow with oncology. She is on letrozole (nonsteroidal aromatase inhibitors), with CA-125 monitored periodically. Has not been able to be quite as active over the [place] pandemic. She was previously seen in this clinic earlier in 2021 for R hip pain. Per PT note at that time pt with 3 years of pain in right hip (and global pain) since onset of estrogen blocking medication s/p CA diagnosis.    Currently  in Pain? Yes    Pain Score 3    "probably a 2 or 3"   Pain Location --   legs           INTERVENTION THIS DATE: -NUSTEP for cardiovascular conditioning and pain modulation: 8 minutes, seat 9, arms 9, level 2x4 minutes, level 3x48minutes, SPM cued >80 -HEP Review:  1. Semi-tandem: 8x5 sec bilat, looks pretty darn good!  2. BUE supported bilat heel raises 2x15; pt starts out unsupported, but cued to hold on so focus is on power rather than balance 3. STS from chair+airex 2x10; looks good, pt reports having a safe place to perform at home  -seated marching 2lb AW 2x10 bilat (RLE  much weaker)  -Right hip long axis distraction in open packed position c mobilization belt 2x60sec supine  -Right femoroacetabular inferior glide at 90 degrees mobilization with movement (belt) 2x30sec grade III -Farmer's Carry, 8lbFW in each hand for 2 laps around gym      PT Short Term Goals - 02/16/20 1331      PT SHORT TERM GOAL #1   Title Patient will be independent in home exercise program to improve strength, mobility, and balance in order to improve function at home    Baseline --    Time 4    Period Weeks    Status New    Target Date 03/15/20      PT SHORT TERM GOAL #2   Title --    Baseline --    Time --    Period --    Status --    Target Date --      PT SHORT TERM GOAL #3   Title --    Time --    Period --    Status --    Target Date --             PT Long Term Goals - 02/16/20 1429      PT LONG TERM GOAL #1   Title Pt will improve BERG by at least 3 points in order to demonstrate clinically significant improvement in balance.    Baseline 02/16/20: 51/56    Time 8    Period Weeks    Status New    Target Date 04/12/20      PT LONG TERM GOAL #2   Title Pt will improve ABC by at least 13% in order to demonstrate clinically significant improvement in balance confidence.    Baseline 02/16/20: 41.9%    Time 8    Period Weeks    Status New    Target Date 04/12/20      PT LONG TERM GOAL #3   Title Pt will decrease 5TSTS by at least 3 seconds in order to demonstrate clinically significant improvement in LE strength.    Baseline 02/16/20: 19.8s    Time 8    Period Weeks    Status New    Target Date 04/12/20      PT LONG TERM GOAL #4   Title Pt will improve FOTO to at least 64 in order to demonstrate clinically significant improvement in function at home.    Baseline 02/16/20: 55    Time 8    Period Weeks    Status New    Target Date 04/12/20                 Plan - 02/24/20 1538    Clinical Impression Statement Pt doing well this date.  Continued with Nustep  training for pain modulation and cardiovascular conditioning, again with favorable response. Reviewed all HEP activities assigned at last session, which appear to be performed very well. Added in additional loading for hips. Also a trial of capsular stretching for Right hip OA related pain and weakness which is favorable. Pt reports relief of pain in Rt hip at end of session, this even whist performing a Farmer's Carry which has potential for further hip aggravation. Pt remains heavily motivated to improved her strength, function, safety, bone density.     Personal Factors and Comorbidities Age;Time since onset of injury/illness/exacerbation;Comorbidity 3+    Comorbidities CA, osteoporosis, lumbar stenosis, hypothyroid    Examination-Activity Limitations Stairs;Carry;Locomotion Level;Squat;Transfers;Stand    Examination-Participation Restrictions Yard Work;Community Activity;Shop    Stability/Clinical Decision Making Evolving/Moderate complexity    Clinical Decision Making Moderate    Rehab Potential Fair    PT Frequency 2x / week    PT Duration 8 weeks    PT Treatment/Interventions Patient/family education;Therapeutic exercise;Balance training;Therapeutic activities;Gait training;ADLs/Self Care Home Management;Electrical Stimulation;Moist Heat;DME Instruction;Stair training;Functional mobility training;Taping;Passive range of motion;Dry needling;Joint Manipulations;Aquatic Therapy;Biofeedback;Canalith Repostioning;Cryotherapy;Iontophoresis 4mg /ml Dexamethasone;Neuromuscular re-education;Manual techniques;Vestibular    PT Next Visit Plan Continue with cardiovascular conditioning, leg strengthening, overground activity for osteoporosis    PT Home Exercise Plan Chair squats, semitandem stance    Consulted and Agree with Plan of Care Patient           Patient will benefit from skilled therapeutic intervention in order to improve the following deficits and impairments:  Pain,  Decreased strength, Difficulty walking, Abnormal gait, Decreased balance, Decreased activity tolerance  Visit Diagnosis: Unsteadiness on feet  Other abnormalities of gait and mobility     Problem List There are no problems to display for this patient.  4:40 PM, 02/24/20 Etta Grandchild, PT, DPT Physical Therapist - [place] [phone]     Etta Grandchild 02/24/2020, 3:45 PM  [place] MAIN [facility] SERVICES [address] [place], [place], [address] Phone: [phone]   Fax:  [phone]  Name: Susan Salas MRN: [id number] Date of Birth: [birth year]

## 2020-03-02 ENCOUNTER — Other Ambulatory Visit: Payer: Self-pay

## 2020-03-02 ENCOUNTER — Ambulatory Visit: Payer: Medicare Other | Attending: Internal Medicine

## 2020-03-02 DIAGNOSIS — R2689 Other abnormalities of gait and mobility: Secondary | ICD-10-CM | POA: Diagnosis present

## 2020-03-02 DIAGNOSIS — R2681 Unsteadiness on feet: Secondary | ICD-10-CM | POA: Diagnosis not present

## 2020-03-02 DIAGNOSIS — R262 Difficulty in walking, not elsewhere classified: Secondary | ICD-10-CM

## 2020-03-02 NOTE — Therapy (Addendum)
[place] MAIN [facility] SERVICES [address] [place], [place], [address] Phone: [phone]   Fax:  [phone]  Physical Therapy Treatment  Patient Details  Name: Susan Salas MRN: [id number] Date of Birth: 1935/10/01 Referring Provider (PT): Ramonita Lab, MD   Encounter Date: 03/02/2020   PT End of Session - 03/02/20 1200    Visit Number 4    Number of Visits 17    Date for PT Re-Evaluation 04/12/20    Authorization Type Evaluation: 02/16/20, FOTO completed    Authorization Time Period 02/16/20-04/12/20    PT Start Time 1156    PT Stop Time 1230    PT Time Calculation (min) 34 min    Equipment Utilized During Treatment Gait belt    Activity Tolerance Patient tolerated treatment well;No increased pain;Patient limited by fatigue    Behavior During Therapy [facility] for tasks assessed/performed           Past Medical History:  Diagnosis Date  . 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 CHF (congestive heart failure) ([place])   . Complication of anesthesia    hard to wake from general anesthesia, long porcedures nausea and vomiting.  Marland Kitchen Hypertension   . Hypothyroidism   . PONV (postoperative nausea and vomiting)   . Spinal headache    with labor of 21 year old son.    Past Surgical History:  Procedure Laterality Date  . ABDOMINAL HYSTERECTOMY    . AMPUTATION TOE Left 05/02/2017   Procedure: AMPUTATION TOE/Left 4th mpj/28820;  Surgeon: Sharlotte Alamo, DPM;  Location: [facility] ORS;  Service: Podiatry;  Laterality: Left;  . APPENDECTOMY    . BACK SURGERY  2014   lamenectomy  . BREAST SURGERY Bilateral 2003   reduction  . CATARACT EXTRACTION W/ INTRAOCULAR LENS IMPLANT Bilateral 2014  . CHOLECYSTECTOMY    . COLON SURGERY  2017  . FRACTURE SURGERY Right 2003   screws & plates  . TONSILLECTOMY      There were no vitals filed for this visit.   Subjective Assessment - 03/02/20 1155    Subjective Pt doing ok today. She continues to complain of her chronic  low back pain but does not rate. She states that she is performing her HEP without issue. No specific questions or concerns upon arrival today.    Pertinent History Pt reports bilateral LE weakness and difficulty with her balance. She has a follow-up appointment with Dr. Manuella Ghazi in the coming weeks. She has a history of laminectomy approximately 4 years ago in "my entire back." She reports severe muscle pain in BLE when walking but worse on the R side. She complains of "swelling in the top of both legs or maybe its just fat." Per note from Dr. 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 Continues to follow with oncology. She is on letrozole (nonsteroidal aromatase inhibitors), with CA-125 monitored periodically. Has not been able to be quite as active over the [place] pandemic. She was previously seen in this clinic earlier in 2021 for R hip pain. Per PT note at that time pt with 3 years of pain in right hip (and global pain) since onset of estrogen blocking medication s/p CA diagnosis.    How long can you stand comfortably? Unlimited but has worsening of low back pain  and low back fatigue.    How long can you walk comfortably? Estimated to be about 1-2 minutes (limited by leg pain)    Diagnostic tests Pt believes that lumbar MRI was approximately 3 years ago    Patient Stated Goals Pt would like to be able to walk farther    Currently in Pain? Yes    Pain Score --   Doesn't rate   Pain Location Back    Pain Orientation Right;Left;Lower    Pain Descriptors / Indicators Constant    Pain Type Chronic pain    Pain Onset More than a month ago    Pain Frequency Constant           TREATMENT   Ther-ex  NuStep for cardiovascular conditioning and pain modulation:  8 minutes, seat 9, arms 9, level 2x4 minutes, level 3x57minutes, SPM cued >80 STS from chair + airex pad 2 x 10 with hands on knees, moderate difficulty for the last few reps of each set; Seated hip flexion marches with 2# ankle weights (AW) 2 x 10; Seated LAQ with 2# AW 2 x 10; Standing heel raises in tandem 2 x 10 bilat, BUE supported  Alternating Step ups   Neuromuscular Re-education  Semi-Tandem stance balance x 30 sec bilat, hands free in //bars 6" alternating step taps without UE support x 10 each; 6" Airex alternating step taps without UE support x 10 each;    Pt educated throughout session about proper posture and technique with exercises. Improved exercise technique, movement at target joints, use of target muscles after min to mod verbal, visual, tactile cues.    Pt demonstrates excellent motivation during session today. She arrived late so session was somewhat abbreviated today. Continued with LE strengthening and balance exercises today. Pt encouraged to continue her HEP and follow-up as scheduled. Pt will benefit from PT services to address deficits in strength, balance, and mobility in order to return to full function at home.                     PT Short Term Goals - 02/16/20 1331      PT SHORT TERM GOAL #1   Title Patient will be independent in home exercise program to improve strength, mobility, and balance in order to improve function at home    Baseline --    Time 4    Period Weeks    Status New    Target Date 03/15/20      PT SHORT TERM GOAL #2   Title --    Baseline --    Time --    Period --    Status --    Target Date --      PT SHORT TERM GOAL #3   Title --    Time --    Period --    Status --    Target Date --             PT Long Term Goals - 02/16/20 1429      PT LONG TERM GOAL #1   Title Pt will improve BERG by at least 3 points in order to demonstrate clinically significant improvement in balance.    Baseline 02/16/20:  51/56    Time 8    Period Weeks    Status New    Target Date 04/12/20      PT LONG TERM GOAL #2   Title Pt will improve ABC by at least 13% in order  to demonstrate clinically significant improvement in balance confidence.    Baseline 02/16/20: 41.9%    Time 8    Period Weeks    Status New    Target Date 04/12/20      PT LONG TERM GOAL #3   Title Pt will decrease 5TSTS by at least 3 seconds in order to demonstrate clinically significant improvement in LE strength.    Baseline 02/16/20: 19.8s    Time 8    Period Weeks    Status New    Target Date 04/12/20      PT LONG TERM GOAL #4   Title Pt will improve FOTO to at least 64 in order to demonstrate clinically significant improvement in function at home.    Baseline 02/16/20: 55    Time 8    Period Weeks    Status New    Target Date 04/12/20                 Plan - 03/02/20 1201    Clinical Impression Statement Pt demonstrates excellent motivation during session today. She arrived late so session was somewhat abbreviated today. Continued with LE strengthening and balance exercises today. Pt encouraged to continue her HEP and follow-up as scheduled. Pt will benefit from PT services to address deficits in strength, balance, and mobility in order to return to full function at home.    Personal Factors and Comorbidities Age;Time since onset of injury/illness/exacerbation;Comorbidity 3+    Comorbidities CA, osteoporosis, lumbar stenosis, hypothyroid    Examination-Activity Limitations Stairs;Carry;Locomotion Level;Squat;Transfers;Stand    Examination-Participation Restrictions Yard Work;Community Activity;Shop    Stability/Clinical Decision Making Evolving/Moderate complexity    Rehab Potential Fair    PT Frequency 2x / week    PT Duration 8 weeks    PT Treatment/Interventions Patient/family education;Therapeutic exercise;Balance training;Therapeutic activities;Gait training;ADLs/Self Care Home Management;Electrical  Stimulation;Moist Heat;DME Instruction;Stair training;Functional mobility training;Taping;Passive range of motion;Dry needling;Joint Manipulations;Aquatic Therapy;Biofeedback;Canalith Repostioning;Cryotherapy;Iontophoresis 4mg /ml Dexamethasone;Neuromuscular re-education;Manual techniques;Vestibular    PT Next Visit Plan Continue with cardiovascular conditioning, leg strengthening, overground activity for osteoporosis    PT Home Exercise Plan Chair squats, semitandem stance    Consulted and Agree with Plan of Care Patient           Patient will benefit from skilled therapeutic intervention in order to improve the following deficits and impairments:  Pain, Decreased strength, Difficulty walking, Abnormal gait, Decreased balance, Decreased activity tolerance  Visit Diagnosis: Unsteadiness on feet  Difficulty in walking, not elsewhere classified     Problem List There are no problems to display for this patient.  Phillips Grout PT, DPT, GCS  Susan Salas 03/02/2020, 4:28 PM  [place] MAIN [facility] SERVICES [address] [place], [place], [address] Phone: [phone]   Fax:  [phone]  Name: Susan Salas MRN: [id number] Date of Birth: 11-04-1935

## 2020-03-08 ENCOUNTER — Ambulatory Visit: Payer: Medicare Other

## 2020-03-08 ENCOUNTER — Other Ambulatory Visit: Payer: Self-pay

## 2020-03-08 DIAGNOSIS — R2681 Unsteadiness on feet: Secondary | ICD-10-CM

## 2020-03-08 DIAGNOSIS — R262 Difficulty in walking, not elsewhere classified: Secondary | ICD-10-CM

## 2020-03-08 NOTE — Therapy (Signed)
[place] MAIN [facility] SERVICES [address] [place], [place], [address] Phone: [phone]   Fax:  [phone]  Physical Therapy Treatment  Patient Details  Name: Susan Salas MRN: [id number] Date of Birth: 03-Jan-1936 Referring Provider (PT): Ramonita Lab, MD   Encounter Date: 03/08/2020   PT End of Session - 03/08/20 1610    Visit Number 5    Number of Visits 17    Date for PT Re-Evaluation 04/12/20    Authorization Type Evaluation: 02/16/20, FOTO completed    Authorization Time Period 02/16/20-04/12/20    PT Start Time 1605    PT Stop Time 1655    PT Time Calculation (min) 50 min    Equipment Utilized During Treatment Gait belt    Activity Tolerance Patient tolerated treatment well;No increased pain;Patient limited by fatigue    Behavior During Therapy [facility] for tasks assessed/performed           Past Medical History:  Diagnosis Date  . Cancer ([place]) 2017   ovarian  . CHF (congestive heart failure) ([place])   . Complication of anesthesia    hard to wake from general anesthesia, long porcedures nausea and vomiting.  Marland Kitchen Hypertension   . Hypothyroidism   . PONV (postoperative nausea and vomiting)   . Spinal headache    with labor of 66 year old son.    Past Surgical History:  Procedure Laterality Date  . ABDOMINAL HYSTERECTOMY    . AMPUTATION TOE Left 05/02/2017   Procedure: AMPUTATION TOE/Left 4th mpj/28820;  Surgeon: Sharlotte Alamo, DPM;  Location: [facility] ORS;  Service: Podiatry;  Laterality: Left;  . APPENDECTOMY    . BACK SURGERY  2014   lamenectomy  . BREAST SURGERY Bilateral 2003   reduction  . CATARACT EXTRACTION W/ INTRAOCULAR LENS IMPLANT Bilateral 2014  . CHOLECYSTECTOMY    . COLON SURGERY  2017  . FRACTURE SURGERY Right 2003   screws & plates  . TONSILLECTOMY      There were no vitals filed for this visit.   Subjective Assessment - 03/08/20 1608    Subjective Pt reports "having a bad day". She is having tremors and  aches. She rates her hip pain a 4/10 and is "tolerable". She states that she is performing her HEP without issue. No specific questions or concerns upon arrival today.    Pertinent History Pt reports bilateral LE weakness and difficulty with her balance. She has a follow-up appointment with Dr. Manuella Ghazi in the coming weeks. She has a history of laminectomy approximately 4 years ago in "my entire back." She reports severe muscle pain in BLE when walking but worse on the R side. She complains of "swelling in the top of both legs or maybe its just fat." Per note from Dr. 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 Continues to follow with oncology. She is on letrozole (nonsteroidal aromatase inhibitors), with CA-125 monitored periodically. Has not been able to be quite as active over the [place] pandemic. She was previously seen in this clinic earlier in 2021 for R hip pain. Per PT note at that time pt with 3 years of pain in right hip (and global pain) since onset of estrogen blocking medication s/p CA diagnosis.    How long can you stand comfortably? Unlimited but has worsening  of low back pain and low back fatigue.    How long can you walk comfortably? Estimated to be about 1-2 minutes (limited by leg pain)    Diagnostic tests Pt believes that lumbar MRI was approximately 3 years ago    Patient Stated Goals Pt would like to be able to walk farther    Currently in Pain? Yes    Pain Score 4     Pain Location Hip    Pain Orientation Right    Pain Descriptors / Indicators Throbbing;Shooting    Pain Type Chronic pain    Pain Onset More than a month ago    Pain Frequency Intermittent                TREATMENT   Ther-ex  NuStep for cardiovascular conditioning  and pain modulation: 8 minutes, seat 9, arms 9, level 2x4 minutes, level 3x55minutes, SPM cued >80 STS from chair + airex pad 2 x 10 with hands on knees, moderate difficulty for the last few reps of each set; Seated hip flexion marches with 2# ankle weights (AW) 2 x 15; Seated LAQ with 2# AW 2 x 10; Heel raises with BUE support 2 x 20; Standing hip abduction with BUE support 2 x 15 BLE; Alternating 6" step-ups with BUE support alternating leading LE x 10 each; Matrix resisted gait 7.5# forward/backward/R lateral/L lateral x 2 each direction;   Neuromuscular Re-education  Airex WBOS static balance without UE support x 30s; Airex NBOS static balance eyes open/closed x 30s each; Airex alternating stepping stone taps with light 2 finger touching x 10 on each side;   Pt educated throughout session about proper posture and technique with exercises. Improved exercise technique, movement at target joints, use of target muscles after min to mod verbal, visual, tactile cues.    Pt demonstrates excellent motivation during session today. She continued with exercises even with her pain. Continued with LE strengthening and balance exercises today. Resisted gait exercises introduced to help strengthen LE and hips. Patient increased reps with her exercises, showing increased strength. Pt encouraged to continue her HEP and follow-up as scheduled. Pt will benefit from PT services to address deficits in strength, balance, and mobility in order to return to full function at home.                          PT Short Term Goals - 02/16/20 1331      PT SHORT TERM GOAL #1   Title Patient will be independent in home exercise program to improve strength, mobility, and balance in order to improve function at home    Baseline --    Time 4    Period Weeks    Status New    Target Date 03/15/20      PT SHORT TERM GOAL #2   Title --    Baseline --    Time --    Period --    Status --     Target Date --      PT SHORT TERM GOAL #3   Title --    Time --    Period --    Status --    Target Date --             PT Long Term Goals - 02/16/20 1429      PT LONG TERM GOAL #1   Title Pt will improve BERG by at least 3 points in order to  demonstrate clinically significant improvement in balance.    Baseline 02/16/20: 51/56    Time 8    Period Weeks    Status New    Target Date 04/12/20      PT LONG TERM GOAL #2   Title Pt will improve ABC by at least 13% in order to demonstrate clinically significant improvement in balance confidence.    Baseline 02/16/20: 41.9%    Time 8    Period Weeks    Status New    Target Date 04/12/20      PT LONG TERM GOAL #3   Title Pt will decrease 5TSTS by at least 3 seconds in order to demonstrate clinically significant improvement in LE strength.    Baseline 02/16/20: 19.8s    Time 8    Period Weeks    Status New    Target Date 04/12/20      PT LONG TERM GOAL #4   Title Pt will improve FOTO to at least 64 in order to demonstrate clinically significant improvement in function at home.    Baseline 02/16/20: 55    Time 8    Period Weeks    Status New    Target Date 04/12/20                 Plan - 03/08/20 1612    Clinical Impression Statement Pt demonstrates excellent motivation during session today. She continued with exercises even with her pain. Continued with LE strengthening and balance exercises today. Resisted gait exercises introduced to help strengthen LE and hips. Patient increased reps with her exercises, showing increased strength. Pt encouraged to continue her HEP and follow-up as scheduled. Pt will benefit from PT services to address deficits in strength, balance, and mobility in order to return to full function at home.    Personal Factors and Comorbidities Age;Time since onset of injury/illness/exacerbation;Comorbidity 3+    Comorbidities CA, osteoporosis, lumbar stenosis, hypothyroid    Examination-Activity  Limitations Stairs;Carry;Locomotion Level;Squat;Transfers;Stand    Examination-Participation Restrictions Yard Work;Community Activity;Shop    Stability/Clinical Decision Making Evolving/Moderate complexity    Rehab Potential Fair    PT Frequency 2x / week    PT Duration 8 weeks    PT Treatment/Interventions Patient/family education;Therapeutic exercise;Balance training;Therapeutic activities;Gait training;ADLs/Self Care Home Management;Electrical Stimulation;Moist Heat;DME Instruction;Stair training;Functional mobility training;Taping;Passive range of motion;Dry needling;Joint Manipulations;Aquatic Therapy;Biofeedback;Canalith Repostioning;Cryotherapy;Iontophoresis 4mg /ml Dexamethasone;Neuromuscular re-education;Manual techniques;Vestibular    PT Next Visit Plan Continue with cardiovascular conditioning, leg strengthening, overground activity for osteoporosis    PT Home Exercise Plan Chair squats, semitandem stance    Consulted and Agree with Plan of Care Patient           Patient will benefit from skilled therapeutic intervention in order to improve the following deficits and impairments:  Pain, Decreased strength, Difficulty walking, Abnormal gait, Decreased balance, Decreased activity tolerance  Visit Diagnosis: Unsteadiness on feet  Difficulty in walking, not elsewhere classified     Problem List There are no problems to display for this patient.  Phillips Grout PT, DPT, GCS  Susan Salas 03/08/2020, 5:14 PM  [place] MAIN [facility] SERVICES [address] [place], [place], [address] Phone: [phone]   Fax:  [phone]  Name: Susan Salas MRN: [id number] Date of Birth: 1936-07-21

## 2020-03-14 ENCOUNTER — Ambulatory Visit: Payer: Medicare Other

## 2020-03-14 ENCOUNTER — Other Ambulatory Visit: Payer: Self-pay

## 2020-03-14 DIAGNOSIS — R262 Difficulty in walking, not elsewhere classified: Secondary | ICD-10-CM

## 2020-03-14 DIAGNOSIS — R2681 Unsteadiness on feet: Secondary | ICD-10-CM | POA: Diagnosis not present

## 2020-03-14 DIAGNOSIS — R2689 Other abnormalities of gait and mobility: Secondary | ICD-10-CM

## 2020-03-14 NOTE — Therapy (Signed)
[place] MAIN [facility] SERVICES [address] [place], [place], [address] Phone: [phone]   Fax:  [phone]  Physical Therapy Treatment  Patient Details  Name: Susan Salas MRN: [id number] Date of Birth: 30-Apr-1936 Referring Provider (PT): Ramonita Lab, MD   Encounter Date: 03/14/2020   PT End of Session - 03/14/20 1544    Visit Number 6    Number of Visits 17    Date for PT Re-Evaluation 04/12/20    Authorization Type Evaluation: 02/16/20, FOTO completed    Authorization Time Period 02/16/20-04/12/20    PT Start Time 1345    PT Stop Time 1430    PT Time Calculation (min) 45 min    Equipment Utilized During Treatment Gait belt    Activity Tolerance Patient tolerated treatment well;No increased pain;Patient limited by fatigue    Behavior During Therapy [facility] for tasks assessed/performed           Past Medical History:  Diagnosis Date  . Cancer ([place]) 2017   ovarian  . CHF (congestive heart failure) ([place])   . Complication of anesthesia    hard to wake from general anesthesia, long porcedures nausea and vomiting.  Marland Kitchen Hypertension   . Hypothyroidism   . PONV (postoperative nausea and vomiting)   . Spinal headache    with labor of 47 year old son.    Past Surgical History:  Procedure Laterality Date  . ABDOMINAL HYSTERECTOMY    . AMPUTATION TOE Left 05/02/2017   Procedure: AMPUTATION TOE/Left 4th mpj/28820;  Surgeon: Sharlotte Alamo, DPM;  Location: [facility] ORS;  Service: Podiatry;  Laterality: Left;  . APPENDECTOMY    . BACK SURGERY  2014   lamenectomy  . BREAST SURGERY Bilateral 2003   reduction  . CATARACT EXTRACTION W/ INTRAOCULAR LENS IMPLANT Bilateral 2014  . CHOLECYSTECTOMY    . COLON SURGERY  2017  . FRACTURE SURGERY Right 2003   screws & plates  . TONSILLECTOMY      There were no vitals filed for this visit.   Subjective Assessment - 03/14/20 1349    Subjective Pt reports doing good. She is taking a new medication for  tremors 3 times a day. She reports no side effects, but the tremors have gotten worse since being on the medication. Her doctor also ordered imaging of her hip. This morning her pain was 10/10 getting out of bed, but now it is back down to 3-4/10. No specific questions or concerns upon arrival today.    Pertinent History Pt reports bilateral LE weakness and difficulty with her balance. She has a follow-up appointment with Dr. Manuella Ghazi in the coming weeks. She has a history of laminectomy approximately 4 years ago in "my entire back." She reports severe muscle pain in BLE when walking but worse on the R side. She complains of "swelling in the top of both legs or maybe its just fat." Per note from Dr. 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 Continues to follow with oncology. She is on letrozole (nonsteroidal aromatase inhibitors), with CA-125 monitored periodically. Has not been able to be quite as active over the [place] pandemic. She was previously seen in this clinic earlier in 2021 for R hip pain. Per PT note at that time pt with 3 years of pain in right  hip (and global pain) since onset of estrogen blocking medication s/p CA diagnosis.    How long can you stand comfortably? Unlimited but has worsening of low back pain and low back fatigue.    How long can you walk comfortably? Estimated to be about 1-2 minutes (limited by leg pain)    Diagnostic tests Pt believes that lumbar MRI was approximately 3 years ago    Patient Stated Goals Pt would like to be able to walk farther    Currently in Pain? Yes    Pain Score 3     Pain Orientation Right    Pain Descriptors / Indicators Throbbing;Shooting    Pain Type Chronic pain    Pain Onset More than a month ago            TREATMENT    Ther-ex  NuStep for cardiovascular conditioning and pain modulation: 8 minutes, seat 9, arms 9, level 3x4 minutes, level 4x26minutes, SPM cued >80 STS from chair + airex pad 2 x 10 with hands out in front, mild-mod difficulty for the last few reps of each set; Verbal cues needed to slow descent; Alternating 6" step-ups with BUE support alternating leading LE x 10 each; Forward and Lateral ladder steps x 4 lengths Forward in and outs with ladder steps x 2 lengths, verbal cues needed  Sideways in and outs with ladder steps x 2 lengths, verbal cues needed    Neuromuscular Re-education  Balancing on airex foam with R reaches and L across the body and slam dunks x 30 Gait Speed changes - walking in hallway, normal/fast/slow upon cue x 4 lengths    Pt educated throughout session about proper posture and technique with exercises. Improved exercise technique, movement at target joints, use of target muscles after min to mod verbal, visual, tactile cues.     Pt demonstrates excellent motivation during session today. Continued with LE strengthening and balance exercises today. Gait exercises introduced to help with parkinsonism tendencies using the ladder for visual cues and changes in gait speed. Patient increased resistance on Nustep showing increased endurance. Pt encouraged to continue her HEP and follow-up as scheduled. Pt will benefit from PT services to address deficits in strength, balance, and mobility in order to return to full function at home.                             PT Short Term Goals - 02/16/20 1331      PT SHORT TERM GOAL #1   Title Patient will be independent in home exercise program to improve strength, mobility, and balance in order to improve function at home    Baseline --    Time 4    Period Weeks    Status New    Target Date 03/15/20      PT SHORT TERM GOAL #2   Title --    Baseline --    Time --    Period --    Status  --    Target Date --      PT SHORT TERM GOAL #3   Title --    Time --    Period --    Status --    Target Date --             PT Long Term Goals - 02/16/20 1429      PT LONG TERM GOAL #1   Title Pt will improve BERG by at  least 3 points in order to demonstrate clinically significant improvement in balance.    Baseline 02/16/20: 51/56    Time 8    Period Weeks    Status New    Target Date 04/12/20      PT LONG TERM GOAL #2   Title Pt will improve ABC by at least 13% in order to demonstrate clinically significant improvement in balance confidence.    Baseline 02/16/20: 41.9%    Time 8    Period Weeks    Status New    Target Date 04/12/20      PT LONG TERM GOAL #3   Title Pt will decrease 5TSTS by at least 3 seconds in order to demonstrate clinically significant improvement in LE strength.    Baseline 02/16/20: 19.8s    Time 8    Period Weeks    Status New    Target Date 04/12/20      PT LONG TERM GOAL #4   Title Pt will improve FOTO to at least 64 in order to demonstrate clinically significant improvement in function at home.    Baseline 02/16/20: 55    Time 8    Period Weeks    Status New    Target Date 04/12/20                 Plan - 03/14/20 1545    Clinical Impression Statement Pt demonstrates excellent motivation during session today. Continued with LE strengthening and balance exercises today. Gait exercises introduced to help with parkinsonism tendencies using the ladder for visual cues and changes in gait speed. Patient has increased strength as sit to stands are improving with less difficulty. Pt encouraged to continue her HEP and follow-up as scheduled. Pt will benefit from PT services to address deficits in strength, balance, and mobility in order to return to full function at home.    Personal Factors and Comorbidities Age;Time since onset of injury/illness/exacerbation;Comorbidity 3+    Comorbidities CA, osteoporosis, lumbar stenosis, hypothyroid     Examination-Activity Limitations Stairs;Carry;Locomotion Level;Squat;Transfers;Stand    Examination-Participation Restrictions Yard Work;Community Activity;Shop    Stability/Clinical Decision Making Evolving/Moderate complexity    Rehab Potential Fair    PT Frequency 2x / week    PT Duration 8 weeks    PT Treatment/Interventions Patient/family education;Therapeutic exercise;Balance training;Therapeutic activities;Gait training;ADLs/Self Care Home Management;Electrical Stimulation;Moist Heat;DME Instruction;Stair training;Functional mobility training;Taping;Passive range of motion;Dry needling;Joint Manipulations;Aquatic Therapy;Biofeedback;Canalith Repostioning;Cryotherapy;Iontophoresis 4mg /ml Dexamethasone;Neuromuscular re-education;Manual techniques;Vestibular    PT Next Visit Plan Eccentric step downs    PT Home Exercise Plan Chair squats, semitandem stance    Consulted and Agree with Plan of Care Patient           Patient will benefit from skilled therapeutic intervention in order to improve the following deficits and impairments:  Pain, Decreased strength, Difficulty walking, Abnormal gait, Decreased balance, Decreased activity tolerance  Visit Diagnosis: Unsteadiness on feet  Difficulty in walking, not elsewhere classified  Other abnormalities of gait and mobility     Problem List There are no problems to display for this patient.   This entire session was performed under direct supervision and direction of a licensed therapist/therapist assistant . I have personally read, edited and approve of the note as written.   Kimmie Shanine Kreiger SPT Lyndel Safe Huprich PT, DPT, GCS  Huprich,Jason 03/14/2020, 5:11 PM  [place] MAIN [facility] SERVICES [address] [place], [place], [address] Phone: [phone]   Fax:  [phone]  Name: Susan Salas  MRN: [id number] Date of Birth: 1935-11-05

## 2020-03-16 ENCOUNTER — Other Ambulatory Visit: Payer: Self-pay

## 2020-03-16 ENCOUNTER — Ambulatory Visit: Payer: Medicare Other

## 2020-03-16 DIAGNOSIS — R2681 Unsteadiness on feet: Secondary | ICD-10-CM

## 2020-03-16 DIAGNOSIS — R2689 Other abnormalities of gait and mobility: Secondary | ICD-10-CM

## 2020-03-16 DIAGNOSIS — R262 Difficulty in walking, not elsewhere classified: Secondary | ICD-10-CM

## 2020-03-16 NOTE — Therapy (Signed)
[place] MAIN [facility] SERVICES [address] [place], [place], [address] Phone: [phone]   Fax:  [phone]  Physical Therapy Treatment  Patient Details  Name: Susan Salas MRN: [id number] Date of Birth: Oct 24, 1935 Referring Provider (PT): Ramonita Lab, MD   Encounter Date: 03/16/2020   PT End of Session - 03/16/20 1353    Visit Number 7    Number of Visits 17    Date for PT Re-Evaluation 04/12/20    Authorization Type Evaluation: 02/16/20, FOTO completed    Authorization Time Period 02/16/20-04/12/20    PT Start Time 1346    PT Stop Time 1429    PT Time Calculation (min) 43 min    Equipment Utilized During Treatment Gait belt    Activity Tolerance Patient tolerated treatment well;No increased pain;Patient limited by fatigue    Behavior During Therapy [facility] for tasks assessed/performed           Past Medical History:  Diagnosis Date  . Cancer ([place]) 2017   ovarian  . CHF (congestive heart failure) ([place])   . Complication of anesthesia    hard to wake from general anesthesia, long porcedures nausea and vomiting.  Marland Kitchen Hypertension   . Hypothyroidism   . PONV (postoperative nausea and vomiting)   . Spinal headache    with labor of 3 year old son.    Past Surgical History:  Procedure Laterality Date  . ABDOMINAL HYSTERECTOMY    . AMPUTATION TOE Left 05/02/2017   Procedure: AMPUTATION TOE/Left 4th mpj/28820;  Surgeon: Sharlotte Alamo, DPM;  Location: [facility] ORS;  Service: Podiatry;  Laterality: Left;  . APPENDECTOMY    . BACK SURGERY  2014   lamenectomy  . BREAST SURGERY Bilateral 2003   reduction  . CATARACT EXTRACTION W/ INTRAOCULAR LENS IMPLANT Bilateral 2014  . CHOLECYSTECTOMY    . COLON SURGERY  2017  . FRACTURE SURGERY Right 2003   screws & plates  . TONSILLECTOMY      There were no vitals filed for this visit.   Subjective Assessment - 03/16/20 1351    Subjective Patient reports she is going to get imaging/nerve conduction  tests at the end of the month. Reports her pain is her normal pain nothing higher. No stumbles or falls since last session.    Pertinent History Pt reports bilateral LE weakness and difficulty with her balance. She has a follow-up appointment with Dr. Manuella Ghazi in the coming weeks. She has a history of laminectomy approximately 4 years ago in "my entire back." She reports severe muscle pain in BLE when walking but worse on the R side. She complains of "swelling in the top of both legs or maybe its just fat." Per note from Dr. 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 Continues to follow with oncology. She is on letrozole (nonsteroidal aromatase inhibitors), with CA-125 monitored periodically. Has not been able to be quite as active over the [place] pandemic. She was previously seen in this clinic earlier in 2021 for R hip pain. Per PT note at that time pt with 3 years of pain in right hip (and global pain) since onset of estrogen blocking medication s/p CA diagnosis.    How long can you stand comfortably? Unlimited but has worsening of low back pain and low back fatigue.  How long can you walk comfortably? Estimated to be about 1-2 minutes (limited by leg pain)    Diagnostic tests Pt believes that lumbar MRI was approximately 3 years ago    Patient Stated Goals Pt would like to be able to walk farther    Currently in Pain? Yes    Pain Score 3     Pain Location Hip    Pain Orientation Right    Pain Descriptors / Indicators Throbbing    Pain Type Chronic pain    Pain Onset More than a month ago               Treatment:   Ther-ex  NuStep for cardiovascular conditioning and pain modulation , seat 9, arms 9,, level 4x22minutes, SPM cued >80  walking  modified hip flexor lengthening in // bars 2x length of // bars    Neuromuscular Re-education: CGA for stabilization with cues for safety awareness and body mechanics  In hallway: -ambulate with big arm swing and large step with focus on overexaggeration of movement w/o AD. Initially slightly unsteady however retained COM with CGA 2x 86 ft -ambulate with rainbow ball raise with CGA, able to maintain large step for ~1/2 length without cueing with CGA 2x 86 ft -standing soccer ball kicks for pertubation's, single limb stability, spatial awareness, and timing. SPT performing CGA while PT provide ball kicks x 6 minutes  In // bars;  -Step over line throw rainbow ball , 10x each side; more challenging stepping backwards with occasional LOB, SPT performing CGA with PT providing ball toss -airex pad sandwhiched with 6" step, lateral step up/down 10x each direction; cues for core activation   Seated soccer ball kicks with upright posture, spatial awareness, timing/muscle recruitment, and coordination x 3 minutes   Pt educated throughout session about proper posture and technique with exercises. Improved exercise technique, movement at target joints, use of target muscles after min to mod verbal, visual, tactile cues.                   PT Education - 03/16/20 1352    Education Details exercise technique, body mechanics    Person(s) Educated Patient    Methods Explanation;Demonstration;Tactile cues;Verbal cues    Comprehension Verbalized understanding;Returned demonstration;Verbal cues required;Tactile cues required            PT Short Term Goals - 02/16/20 1331      PT SHORT TERM GOAL #1   Title Patient will be independent in home exercise program to improve strength, mobility, and balance in order to improve function at home    Baseline --    Time 4    Period Weeks    Status New    Target Date 03/15/20      PT SHORT TERM GOAL #2   Title --    Baseline --    Time --     Period --    Status --    Target Date --      PT SHORT TERM GOAL #3   Title --    Time --    Period --    Status --    Target Date --             PT Long Term Goals - 02/16/20 1429      PT LONG TERM GOAL #1   Title Pt will improve BERG by at least 3 points in order to demonstrate clinically significant improvement in balance.    Baseline  02/16/20: 51/56    Time 8    Period Weeks    Status New    Target Date 04/12/20      PT LONG TERM GOAL #2   Title Pt will improve ABC by at least 13% in order to demonstrate clinically significant improvement in balance confidence.    Baseline 02/16/20: 41.9%    Time 8    Period Weeks    Status New    Target Date 04/12/20      PT LONG TERM GOAL #3   Title Pt will decrease 5TSTS by at least 3 seconds in order to demonstrate clinically significant improvement in LE strength.    Baseline 02/16/20: 19.8s    Time 8    Period Weeks    Status New    Target Date 04/12/20      PT LONG TERM GOAL #4   Title Pt will improve FOTO to at least 64 in order to demonstrate clinically significant improvement in function at home.    Baseline 02/16/20: 55    Time 8    Period Weeks    Status New    Target Date 04/12/20                 Plan - 03/16/20 1546    Clinical Impression Statement Patient presents to physical therapy with excellent motivation. Focus on increasing step length and utilizing UE's for arm swing improved gait mechanics, speed, and stability with ambulation without an AD with short term carryover. Single leg stability and dual task challenges are areas for continued focus. Pt will benefit from PT services to address deficits in strength, balance, and mobility in order to return to full function at home.    Personal Factors and Comorbidities Age;Time since onset of injury/illness/exacerbation;Comorbidity 3+    Comorbidities CA, osteoporosis, lumbar stenosis, hypothyroid    Examination-Activity Limitations Stairs;Carry;Locomotion  Level;Squat;Transfers;Stand    Examination-Participation Restrictions Yard Work;Community Activity;Shop    Stability/Clinical Decision Making Evolving/Moderate complexity    Rehab Potential Fair    PT Frequency 2x / week    PT Duration 8 weeks    PT Treatment/Interventions Patient/family education;Therapeutic exercise;Balance training;Therapeutic activities;Gait training;ADLs/Self Care Home Management;Electrical Stimulation;Moist Heat;DME Instruction;Stair training;Functional mobility training;Taping;Passive range of motion;Dry needling;Joint Manipulations;Aquatic Therapy;Biofeedback;Canalith Repostioning;Cryotherapy;Iontophoresis 4mg /ml Dexamethasone;Neuromuscular re-education;Manual techniques;Vestibular    PT Next Visit Plan Eccentric step downs    PT Home Exercise Plan Chair squats, semitandem stance    Consulted and Agree with Plan of Care Patient           Patient will benefit from skilled therapeutic intervention in order to improve the following deficits and impairments:  Pain, Decreased strength, Difficulty walking, Abnormal gait, Decreased balance, Decreased activity tolerance  Visit Diagnosis: Unsteadiness on feet  Difficulty in walking, not elsewhere classified  Other abnormalities of gait and mobility     Problem List There are no problems to display for this patient.  Janna Arch, PT, DPT   03/16/2020, 3:48 PM  [place] MAIN [facility] SERVICES [address] [place], [place], [address] Phone: [phone]   Fax:  [phone]  Name: DONELLE HISE MRN: [id number] Date of Birth: 12-15-1935

## 2020-03-23 ENCOUNTER — Ambulatory Visit: Payer: Medicare Other

## 2020-03-28 ENCOUNTER — Other Ambulatory Visit: Payer: Self-pay

## 2020-03-28 ENCOUNTER — Ambulatory Visit: Payer: Medicare Other

## 2020-03-28 DIAGNOSIS — R2681 Unsteadiness on feet: Secondary | ICD-10-CM | POA: Diagnosis not present

## 2020-03-28 DIAGNOSIS — R2689 Other abnormalities of gait and mobility: Secondary | ICD-10-CM

## 2020-03-28 NOTE — Therapy (Signed)
[place] MAIN [facility] SERVICES [address] [place], [place], [address] Phone: [phone]   Fax:  [phone]  Physical Therapy Treatment  Patient Details  Name: Susan Salas MRN: [id number] Date of Birth: 01-12-1936 Referring Provider (PT): Ramonita Lab, MD   Encounter Date: 03/28/2020   PT End of Session - 03/28/20 1632    Visit Number 8    Number of Visits 17    Date for PT Re-Evaluation 04/12/20    Authorization Type Evaluation: 02/16/20, FOTO completed    Authorization Time Period 02/16/20-04/12/20    PT Start Time 1515    PT Stop Time 1600    PT Time Calculation (min) 45 min    Equipment Utilized During Treatment Gait belt    Activity Tolerance Patient tolerated treatment well;No increased pain;Patient limited by fatigue    Behavior During Therapy [facility] for tasks assessed/performed           Past Medical History:  Diagnosis Date  . Cancer ([place]) 2017   ovarian  . CHF (congestive heart failure) ([place])   . Complication of anesthesia    hard to wake from general anesthesia, long porcedures nausea and vomiting.  Marland Kitchen Hypertension   . Hypothyroidism   . PONV (postoperative nausea and vomiting)   . Spinal headache    with labor of 55 year old son.    Past Surgical History:  Procedure Laterality Date  . ABDOMINAL HYSTERECTOMY    . AMPUTATION TOE Left 05/02/2017   Procedure: AMPUTATION TOE/Left 4th mpj/28820;  Surgeon: Sharlotte Alamo, DPM;  Location: [facility] ORS;  Service: Podiatry;  Laterality: Left;  . APPENDECTOMY    . BACK SURGERY  2014   lamenectomy  . BREAST SURGERY Bilateral 2003   reduction  . CATARACT EXTRACTION W/ INTRAOCULAR LENS IMPLANT Bilateral 2014  . CHOLECYSTECTOMY    . COLON SURGERY  2017  . FRACTURE SURGERY Right 2003   screws & plates  . TONSILLECTOMY      There were no vitals filed for this visit.   Subjective Assessment - 03/28/20 1520    Subjective Patient reports tremor medication has helped tremendously.  She had hip imaging today and will be getting a needle injection in the next couple of weeks to figure out whether the pain is coming from the hip or the back. Patient reports doing her HEP. No stumbles or falls since last session. No other updates or concerns at this time.    Pertinent History Pt reports bilateral LE weakness and difficulty with her balance. She has a follow-up appointment with Dr. Manuella Ghazi in the coming weeks. She has a history of laminectomy approximately 4 years ago in "my entire back." She reports severe muscle pain in BLE when walking but worse on the R side. She complains of "swelling in the top of both legs or maybe its just fat." Per note from Dr. 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 Continues to follow with oncology. She is on letrozole (nonsteroidal aromatase inhibitors), with CA-125 monitored periodically. Has not been able to be quite as active over the [place] pandemic. She was previously seen in this clinic earlier in 2021 for R hip pain. Per PT note at that time pt with 3 years of pain in right hip (and global pain) since onset of  estrogen blocking medication s/p CA diagnosis.    How long can you stand comfortably? Unlimited but has worsening of low back pain and low back fatigue.    How long can you walk comfortably? Estimated to be about 1-2 minutes (limited by leg pain)    Diagnostic tests Pt believes that lumbar MRI was approximately 3 years ago    Patient Stated Goals Pt would like to be able to walk farther    Currently in Pain? No/denies    Pain Onset --             TREATMENT:    Ther-ex  NuStep for cardiovascular conditioning and pain modulation, seat 9, arms 9, level 4x78minutes, SPM cued >80; STS from chair x 10  with hands out in front (forgot to add airex pad); STS from chair x 10 with 2kg ball in front; Alternating 6" step-ups airex pad with BUE support alternating leading LE x 10 each;    Neuromuscular Re-education: CGA for stabilization with cues for safety awareness and body mechanics  Step over foam roller throws/catches, 10x each side; more challenging stepping backwards with occasional LOB, SPT performing CGA with PT providing ball toss, cued for no UE suport; Tandem airex pad walks x 2 lengths, cued for no UE support; Side to side airex walks x 3 lengths, cued to slow down speed; Airex pads sandwiched with 6" step, lateral step up/down 10x each direction; cues for core activation  Standing soccer ball kicks for pertubation's, single limb stability, spatial awareness, and timing, SPT performing CGA while PT provide ball kicks x multiple bouts, PT called which leg to kick with, cued for no UE support; Bosu ball lunges (with flat side down) x 10 each LE, x 5 each LE with pulses, cued for no UE support.    Pt educated throughout session about proper posture and technique with exercises. Improved exercise technique, movement at target joints, use of target muscles after min to mod verbal, visual, tactile cues.  Pt demonstrates excellent motivation during session today. Session focused on a combination of strength and balance exercises. Added more dynamic exercises with big steps to increase difficulty as her balance has improved. Single leg stability and dual task challenges are areas for continued focus. Pt encouraged to continue her HEP and follow-up as scheduled. Pt will benefit from PT services to address deficits in strength, balance, and mobility in order to return to full function at home.             PT Short Term Goals - 02/16/20 1331      PT SHORT TERM GOAL #1   Title Patient will be independent in home exercise program to improve strength, mobility, and balance in order to  improve function at home    Baseline --    Time 4    Period Weeks    Status New    Target Date 03/15/20      PT SHORT TERM GOAL #2   Title --    Baseline --    Time --    Period --    Status --    Target Date --      PT SHORT TERM GOAL #3   Title --    Time --    Period --    Status --    Target Date --             PT Long Term Goals - 02/16/20 1429      PT LONG  TERM GOAL #1   Title Pt will improve BERG by at least 3 points in order to demonstrate clinically significant improvement in balance.    Baseline 02/16/20: 51/56    Time 8    Period Weeks    Status New    Target Date 04/12/20      PT LONG TERM GOAL #2   Title Pt will improve ABC by at least 13% in order to demonstrate clinically significant improvement in balance confidence.    Baseline 02/16/20: 41.9%    Time 8    Period Weeks    Status New    Target Date 04/12/20      PT LONG TERM GOAL #3   Title Pt will decrease 5TSTS by at least 3 seconds in order to demonstrate clinically significant improvement in LE strength.    Baseline 02/16/20: 19.8s    Time 8    Period Weeks    Status New    Target Date 04/12/20      PT LONG TERM GOAL #4   Title Pt will improve FOTO to at least 64 in order to demonstrate clinically significant improvement in function at home.    Baseline 02/16/20: 55    Time 8    Period Weeks    Status New    Target Date 04/12/20                 Plan - 03/28/20 1639    Clinical Impression Statement Pt demonstrates excellent motivation during session today. Session focused on a combination of strength and balance exercises. Added more dynamic exercises with big steps to increase difficulty as her balance has improved. Single leg stability and dual task challenges are areas for continued focus. Pt encouraged to continue her HEP and follow-up as scheduled. Pt will benefit from PT services to address deficits in strength, balance, and mobility in order to return to full function at  home.    Personal Factors and Comorbidities Age;Time since onset of injury/illness/exacerbation;Comorbidity 3+    Comorbidities CA, osteoporosis, lumbar stenosis, hypothyroid    Examination-Activity Limitations Stairs;Carry;Locomotion Level;Squat;Transfers;Stand    Examination-Participation Restrictions Yard Work;Community Activity;Shop    Stability/Clinical Decision Making Evolving/Moderate complexity    Rehab Potential Fair    PT Frequency 2x / week    PT Duration 8 weeks    PT Treatment/Interventions Patient/family education;Therapeutic exercise;Balance training;Therapeutic activities;Gait training;ADLs/Self Care Home Management;Electrical Stimulation;Moist Heat;DME Instruction;Stair training;Functional mobility training;Taping;Passive range of motion;Dry needling;Joint Manipulations;Aquatic Therapy;Biofeedback;Canalith Repostioning;Cryotherapy;Iontophoresis 4mg /ml Dexamethasone;Neuromuscular re-education;Manual techniques;Vestibular    PT Next Visit Plan Eccentric step downs    PT Home Exercise Plan Chair squats, semitandem stance    Consulted and Agree with Plan of Care Patient           Patient will benefit from skilled therapeutic intervention in order to improve the following deficits and impairments:  Pain, Decreased strength, Difficulty walking, Abnormal gait, Decreased balance, Decreased activity tolerance  Visit Diagnosis: Unsteadiness on feet  Other abnormalities of gait and mobility     Problem List There are no problems to display for this patient.  This entire session was performed under direct supervision and direction of a licensed therapist/therapist assistant . I have personally read, edited and approve of the note as written.   Noemi Chapel, SPT Phillips Grout PT, DPT, GCS  Huprich,Jason 03/29/2020, 10:54 AM  [place] MAIN [facility] SERVICES [address] [place], [place], [address] Phone: [phone]   Fax:   [phone]  Name: Susan Salas MRN: [id number] Date of Birth: [birth year]

## 2020-03-30 ENCOUNTER — Ambulatory Visit: Payer: Medicare Other

## 2020-04-03 ENCOUNTER — Ambulatory Visit: Payer: Medicare Other | Admitting: Physical Therapy

## 2020-04-04 ENCOUNTER — Other Ambulatory Visit: Payer: Self-pay

## 2020-04-04 ENCOUNTER — Ambulatory Visit: Payer: Medicare Other | Attending: Internal Medicine

## 2020-04-04 DIAGNOSIS — M25651 Stiffness of right hip, not elsewhere classified: Secondary | ICD-10-CM | POA: Insufficient documentation

## 2020-04-04 DIAGNOSIS — R2689 Other abnormalities of gait and mobility: Secondary | ICD-10-CM | POA: Insufficient documentation

## 2020-04-04 DIAGNOSIS — M6281 Muscle weakness (generalized): Secondary | ICD-10-CM | POA: Diagnosis present

## 2020-04-04 DIAGNOSIS — M25551 Pain in right hip: Secondary | ICD-10-CM | POA: Diagnosis present

## 2020-04-04 DIAGNOSIS — R2681 Unsteadiness on feet: Secondary | ICD-10-CM | POA: Insufficient documentation

## 2020-04-04 DIAGNOSIS — R262 Difficulty in walking, not elsewhere classified: Secondary | ICD-10-CM | POA: Diagnosis present

## 2020-04-04 NOTE — Therapy (Signed)
[place] MAIN [facility] SERVICES [address] [place], [place], [address] Phone: [phone]   Fax:  [phone]  Physical Therapy Treatment  Patient Details  Name: Susan Salas MRN: [id number] Date of Birth: [birth year] Referring Provider (PT): Ramonita Lab, MD   Encounter Date: 04/04/2020   PT End of Session - 04/04/20 1524    Visit Number 9    Number of Visits 17    Date for PT Re-Evaluation 04/12/20    Authorization Type Evaluation: 02/16/20, FOTO completed    Authorization Time Period 02/16/20-04/12/20    PT Start Time 1517    PT Stop Time 1600    PT Time Calculation (min) 43 min    Equipment Utilized During Treatment Gait belt    Activity Tolerance Patient tolerated treatment well;No increased pain;Patient limited by fatigue    Behavior During Therapy [facility] for tasks assessed/performed           Past Medical History:  Diagnosis Date  . Cancer ([place]) 2017   ovarian  . CHF (congestive heart failure) ([place])   . Complication of anesthesia    hard to wake from general anesthesia, long porcedures nausea and vomiting.  Marland Kitchen Hypertension   . Hypothyroidism   . PONV (postoperative nausea and vomiting)   . Spinal headache    with labor of 21 year old son.    Past Surgical History:  Procedure Laterality Date  . ABDOMINAL HYSTERECTOMY    . AMPUTATION TOE Left 05/02/2017   Procedure: AMPUTATION TOE/Left 4th mpj/28820;  Surgeon: Sharlotte Alamo, DPM;  Location: [facility] ORS;  Service: Podiatry;  Laterality: Left;  . APPENDECTOMY    . BACK SURGERY  2014   lamenectomy  . BREAST SURGERY Bilateral 2003   reduction  . CATARACT EXTRACTION W/ INTRAOCULAR LENS IMPLANT Bilateral 2014  . CHOLECYSTECTOMY    . COLON SURGERY  2017  . FRACTURE SURGERY Right 2003   screws & plates  . TONSILLECTOMY      There were no vitals filed for this visit.   Subjective Assessment - 04/04/20 1522    Subjective Patient reports tremor medication has helped tremendously.  Her hip has not hurt since the last visit, so she thinks the pain is coming from the back. Imaging showed arthritis, but nothing new that she didn't already know. She reports that her knees have been causing her problems when she gets up from sitting down, but it feels better today. Patient is going to hold out on the needle injection for now. Patient reports doing her HEP. No stumbles or falls since last session. No other updates or concerns at this time.    Pertinent History Pt reports bilateral LE weakness and difficulty with her balance. She has a follow-up appointment with Dr. Manuella Ghazi in the coming weeks. She has a history of laminectomy approximately 4 years ago in "my entire back." She reports severe muscle pain in BLE when walking but worse on the R side. She complains of "swelling in the top of both legs or maybe its just fat." Per note from Dr. 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 Continues to follow with oncology. She is on letrozole (nonsteroidal aromatase inhibitors), with CA-125 monitored periodically. Has not been able to be quite as active over the [place] pandemic. She was previously  seen in this clinic earlier in 2021 for R hip pain. Per PT note at that time pt with 3 years of pain in right hip (and global pain) since onset of estrogen blocking medication s/p CA diagnosis.    How long can you stand comfortably? Unlimited but has worsening of low back pain and low back fatigue.    How long can you walk comfortably? Estimated to be about 1-2 minutes (limited by leg pain)    Diagnostic tests Pt believes that lumbar MRI was approximately 3 years ago    Patient Stated Goals Pt would like to be able to walk farther    Currently in Pain? No/denies              TREATMENT     Neuromuscular Re-education: CGA for stabilization with cues for safety awareness and body mechanics  NuStep for cardiovascular conditioning and pain modulation, seat 9, arms 9, level 4x20minutes, SPM cued >80;  Airex pads sandwiched with 6" step, lateral step up/down 10x each direction;  Standing soccer ball kicks for pertubation's, single limb stability, spatial awareness, and timing, SPT performing CGA while PT provide ball kicks x multiple bouts, cued for no UE support; Matrix resisted gait 7.5# forward/backward/R lateral/L lateral x 2 each direction, patient notes that it was easy after exercise, increase weight next time, cued for taking big steps and a controlled descent. Agility ladder forward and sidesteps x 2 lengths each direction, cued for big steps and proper feet positioning, patient notes that was easy. Grapevines x 2 lengths, patient did well with no cueing or loss of balance. Obstacle course: foam pad, 6" hurdle step, foam roller, stepping over and onto foam pads x 6 lengths, cued for taking big steps and picking up the L foot.  Red mat with uneven surface walk, no UE support Forward and backwards x 2 lengths, eyes closed x 3 lengths Side steps x 2 lengths, eyes closed x 3 lengths     Pt educated throughout session about proper posture and technique with exercises. Improved exercise technique, movement at target joints, use of target muscles after min to mod verbal, visual, tactile cues.    Pt demonstrates excellent motivation during session today. Session focused on balance exercises. Sit to stands and step ups deferred today due pain in the knees the last few days. Patient's balance has improved given that ladder steps and grapevine were once a challenge and now are not. Added more dynamic exercises with big steps to increase difficulty as her balance has improved. Single leg stability and dual task challenges are areas for continued focus. Will update  goals and re-test outcome measures next visit. Pt encouraged to continue her HEP and follow-up as scheduled. Pt will benefit from PT services to address deficits in strength, balance, and mobility in order to return to full function at home.             PT Short Term Goals - 02/16/20 1331      PT SHORT TERM GOAL #1   Title Patient will be independent in home exercise program to improve strength, mobility, and balance in order to improve function at home    Baseline --    Time 4    Period Weeks    Status New    Target Date 03/15/20      PT SHORT TERM GOAL #2   Title --    Baseline --    Time --    Period --  Status --    Target Date --      PT SHORT TERM GOAL #3   Title --    Time --    Period --    Status --    Target Date --             PT Long Term Goals - 02/16/20 1429      PT LONG TERM GOAL #1   Title Pt will improve BERG by at least 3 points in order to demonstrate clinically significant improvement in balance.    Baseline 02/16/20: 51/56    Time 8    Period Weeks    Status New    Target Date 04/12/20      PT LONG TERM GOAL #2   Title Pt will improve ABC by at least 13% in order to demonstrate clinically significant improvement in balance confidence.    Baseline 02/16/20: 41.9%    Time 8    Period Weeks    Status New    Target Date 04/12/20      PT LONG TERM GOAL #3   Title Pt will decrease 5TSTS by at least 3 seconds in order to demonstrate clinically significant improvement in LE strength.    Baseline 02/16/20: 19.8s    Time 8    Period Weeks    Status New    Target Date 04/12/20      PT LONG TERM GOAL #4   Title Pt will improve FOTO to at least 64 in order to demonstrate clinically significant improvement in function at home.    Baseline 02/16/20: 55    Time 8    Period Weeks    Status New    Target Date 04/12/20                 Plan - 04/04/20 1527    Clinical Impression Statement Pt educated throughout session about proper  posture and technique with exercises. Improved exercise technique, movement at target joints, use of target muscles after min to mod verbal, visual, tactile cues. Pt demonstrates excellent motivation during session today. Session focused on balance exercises. Sit to stands and step ups deferred today due pain in the knees the last few days. Patient's balance has improved given that ladder steps and grapevine were once a challenge and now are not. Added more dynamic exercises with big steps to increase difficulty as her balance has improved. Single leg stability and dual task challenges are areas for continued focus. Will update goals and re-test outcome measures next visit. Pt encouraged to continue her HEP and follow-up as scheduled. Pt will benefit from PT services to address deficits in strength, balance, and mobility in order to return to full function at home.    Personal Factors and Comorbidities Age;Time since onset of injury/illness/exacerbation;Comorbidity 3+    Comorbidities CA, osteoporosis, lumbar stenosis, hypothyroid    Examination-Activity Limitations Stairs;Carry;Locomotion Level;Squat;Transfers;Stand    Examination-Participation Restrictions Yard Work;Community Activity;Shop    Stability/Clinical Decision Making Evolving/Moderate complexity    Rehab Potential Fair    PT Frequency 2x / week    PT Duration 8 weeks    PT Treatment/Interventions Patient/family education;Therapeutic exercise;Balance training;Therapeutic activities;Gait training;ADLs/Self Care Home Management;Electrical Stimulation;Moist Heat;DME Instruction;Stair training;Functional mobility training;Taping;Passive range of motion;Dry needling;Joint Manipulations;Aquatic Therapy;Biofeedback;Canalith Repostioning;Cryotherapy;Iontophoresis 4mg /ml Dexamethasone;Neuromuscular re-education;Manual techniques;Vestibular    PT Next Visit Plan Eccentric step downs    PT Home Exercise Plan Chair squats, semitandem stance    Consulted  and Agree with Plan of Care Patient  Patient will benefit from skilled therapeutic intervention in order to improve the following deficits and impairments:  Pain, Decreased strength, Difficulty walking, Abnormal gait, Decreased balance, Decreased activity tolerance  Visit Diagnosis: Unsteadiness on feet  Other abnormalities of gait and mobility     Problem List There are no problems to display for this patient.  This entire session was performed under direct supervision and direction of a licensed therapist/therapist assistant . I have personally read, edited and approve of the note as written.   Noemi Chapel, SPT [facility] PT, DPT, GCS  Huprich,Jason 04/05/2020, 11:15 AM  [place] MAIN [facility] SERVICES [address] [place], [place], [address] Phone: [phone]   Fax:  [phone]  Name: Susan Salas MRN: [id number] Date of Birth: 08-04-1936

## 2020-04-12 ENCOUNTER — Ambulatory Visit: Payer: Medicare Other

## 2020-04-12 ENCOUNTER — Other Ambulatory Visit: Payer: Self-pay

## 2020-04-12 DIAGNOSIS — R2681 Unsteadiness on feet: Secondary | ICD-10-CM

## 2020-04-12 DIAGNOSIS — R2689 Other abnormalities of gait and mobility: Secondary | ICD-10-CM

## 2020-04-12 NOTE — Therapy (Signed)
[facility] MAIN REHAB SERVICES [address] [place], [place], [address] Phone: [phone]   Fax:  [phone]  Physical Therapy Treatment  Patient Details  Name: Susan Salas MRN: [id number] Date of Birth: 11/04/1935 Referring Provider (PT): Bert Klein, MD   Encounter Date: 04/12/2020   PT End of Session - 04/12/20 1806    Visit Number 10    Number of Visits 33    Date for PT Re-Evaluation 06/07/20    Authorization Type Evaluation: 02/16/20, FOTO completed    Authorization Time Period 02/16/20-04/12/20    PT Start Time 1521    PT Stop Time 1600    PT Time Calculation (min) 39 min    Equipment Utilized During Treatment Gait belt    Activity Tolerance Patient tolerated treatment well;No increased pain;Patient limited by fatigue    Behavior During Therapy WFL for tasks assessed/performed           Past Medical History:  Diagnosis Date  . 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 CHF (congestive heart failure) (HCC)   . Complication of anesthesia    hard to wake from general anesthesia, long porcedures nausea and vomiting.  . Hypertension   . Hypothyroidism   . PONV (postoperative nausea and vomiting)   . Spinal headache    with labor of 58 year old son.    Past Surgical History:  Procedure Laterality Date  . ABDOMINAL HYSTERECTOMY    . AMPUTATION TOE Left 05/02/2017   Procedure: AMPUTATION TOE/Left 4th mpj/28820;  Surgeon: Cline, Todd, DPM;  Location: [facility] ORS;  Service: Podiatry;  Laterality: Left;  . APPENDECTOMY    . BACK SURGERY  2014   lamenectomy  . BREAST SURGERY Bilateral 2003   reduction  . CATARACT EXTRACTION W/ INTRAOCULAR LENS IMPLANT Bilateral 2014  . CHOLECYSTECTOMY    . COLON SURGERY  2017  . FRACTURE SURGERY Right 2003   screws & plates  . TONSILLECTOMY      There were no vitals filed for this visit.   Subjective Assessment - 04/12/20 1540    Subjective Patient reports tremor medication has helped tremendously,  but maybe too well as she is always tired and sleepy. The doctors are still adjusting and figuring out the right dosage. Patient reports doing her HEP. No stumbles or falls since last session. No other updates or concerns at this time.    Pertinent History Pt reports bilateral LE weakness and difficulty with her balance. She has a follow-up appointment with Dr. Shah in the coming weeks. She has a history of laminectomy approximately 4 years ago in "my entire back." She reports severe muscle pain in BLE when walking but worse on the R side. She complains of "swelling in the top of both legs or maybe its just fat." Per note from Dr. 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 Continues to follow with oncology. She is on letrozole (nonsteroidal aromatase inhibitors), with CA-125 monitored periodically. Has not been able to be quite as active over the COVID pandemic. She was previously seen in this clinic earlier in 2021 for R hip pain. Per PT note at that time pt with 3 years of pain in right hip (and global pain) since onset of estrogen blocking medication s/p CA diagnosis.    How   long can you stand comfortably? Unlimited but has worsening of low back pain and low back fatigue.    How long can you walk comfortably? Estimated to be about 1-2 minutes (limited by leg pain)    Diagnostic tests Pt believes that lumbar MRI was approximately 3 years ago    Patient Stated Goals Pt would like to be able to walk farther    Currently in Pain? No/denies             TREATMENT   Ther-Ex Updated goals and outcome measures.  5TSTS: 11 secs BERG: 53/56 ABC: 68.75% FOTO: 60  Leg Press 55# 2 x 20 reps, 1 x 10 reps;  Reviewed HEP: Standing Tandem Balance with Counter  Support  Single Leg Stance with Support Sit to Stand without Arm Support  Standing Hip Abduction with Counter Support Standing Hip Extension with Counter Support Heel Toe Raises with Counter Support    Pt educated throughout session about proper posture and technique with exercises. Improved exercise technique, movement at target joints, use of target muscles after min to mod verbal, visual, tactile cues.   Pt demonstrates excellent motivation during session today. Patient has improved with all outcome measures. She has clinically significantly improved her ABC score and 5TSTS. Her BERG score improved by 2 points. Updated her HEP with exercises. Next sessions will perform an examination of her low back and will focus for the next few visits on her back pain. Pt will benefit from PT services to address deficits in strength, balance, and mobility in order to return to full function at home.     PT Short Term Goals - 04/12/20 1807      PT SHORT TERM GOAL #1   Title Patient will be independent in home exercise program to improve strength, mobility, and balance in order to improve function at home    Baseline 04/12/20: Patient is performing exercises at home.    Time 4    Period Weeks    Status Achieved    Target Date 03/15/20             PT Long Term Goals - 04/12/20 1808      PT LONG TERM GOAL #1   Title Pt will improve BERG by at least 3 points in order to demonstrate clinically significant improvement in balance.    Baseline 02/16/20: 51/56; 04/12/20: 53/56    Time 8    Period Weeks    Status Partially Met      PT LONG TERM GOAL #2   Title Pt will improve ABC by at least 13% in order to demonstrate clinically significant improvement in balance confidence.    Baseline 02/16/20: 41.9%; 04/12/20: 68.75%    Time 8    Period Weeks    Status Achieved      PT LONG TERM GOAL #3   Title Pt will decrease 5TSTS by at least 3 seconds in order to demonstrate clinically significant  improvement in LE strength.    Baseline 02/16/20: 19.8s; 04/12/20: 11 secs    Time 8    Period Weeks    Status Achieved      PT LONG TERM GOAL #4   Title Pt will improve FOTO to at least 64 in order to demonstrate clinically significant improvement in function at home.    Baseline 02/16/20: 55; 04/12/20:    Time 8    Period Weeks    Status New  Plan - 04/12/20 1807    Clinical Impression Statement Pt demonstrates excellent motivation during session today. Patient has improved with all outcome measures. She has clinically significantly improved her ABC score and 5TSTS. Her BERG score improved by 2 points. Updated her HEP with exercises. Next sessions will perform an examination of her low back and will focus for the next few visits on her back pain. Pt will benefit from PT services to address deficits in strength, balance, and mobility in order to return to full function at home.    Personal Factors and Comorbidities Age;Time since onset of injury/illness/exacerbation;Comorbidity 3+    Comorbidities CA, osteoporosis, lumbar stenosis, hypothyroid    Examination-Activity Limitations Stairs;Carry;Locomotion Level;Squat;Transfers;Stand    Examination-Participation Restrictions Yard Work;Community Activity;Shop    Stability/Clinical Decision Making Evolving/Moderate complexity    Rehab Potential Fair    PT Frequency 2x / week    PT Duration 8 weeks    PT Treatment/Interventions Patient/family education;Therapeutic exercise;Balance training;Therapeutic activities;Gait training;ADLs/Self Care Home Management;Electrical Stimulation;Moist Heat;DME Instruction;Stair training;Functional mobility training;Taping;Passive range of motion;Dry needling;Joint Manipulations;Aquatic Therapy;Biofeedback;Canalith Repostioning;Cryotherapy;Iontophoresis 26m/ml Dexamethasone;Neuromuscular re-education;Manual techniques;Vestibular    PT Next Visit Plan Next session, look at her back    PT HT[place] [id number]   Consulted and Agree with Plan of Care Patient           Patient will benefit from skilled therapeutic intervention in order to improve the following deficits and impairments:  Pain, Decreased strength, Difficulty walking, Abnormal gait, Decreased balance, Decreased activity tolerance  Visit Diagnosis: Unsteadiness on feet  Other abnormalities of gait and mobility     Problem List There are no problems to display for this patient.   This entire session was performed under direct supervision and direction of a licensed therapist/therapist assistant . I have personally read, edited and approve of the note as written.   KNoemi Chapel SPT JPhillips GroutPT, DPT, GCS  Huprich,Jason 04/13/2020, 10:39 AM  [place] RProm[facility] LLCSERVICES [address] [place] [address]Phone: [phone]  Fax:  [phone] Name: Susan BRANSCOMBMRN: [id number] of Birth: [birth year]

## 2020-04-12 NOTE — Patient Instructions (Signed)
Access Code: [email or web address] URL: [email or web address] Date: 04/12/2020 Prepared by: Roxana Hires  Exercises Standing Tandem Balance with Counter Support - 1 x daily - 7 x weekly - 2 sets - 2 reps - 30s hold Single Leg Stance with Support - 1 x daily - 7 x weekly - 2 sets - 2 reps - 30s hold Sit to Stand without Arm Support - 1 x daily - 7 x weekly - 2 sets - 10 reps Standing Hip Abduction with Counter Support - 1 x daily - 7 x weekly - 2 sets - 10 reps Standing Hip Extension with Counter Support - 1 x daily - 7 x weekly - 2 sets - 10 reps Heel Toe Raises with Counter Support - 1 x daily - 7 x weekly - 2 sets - 10 reps

## 2020-04-19 ENCOUNTER — Ambulatory Visit: Payer: Medicare Other

## 2020-04-26 ENCOUNTER — Ambulatory Visit: Payer: Medicare Other

## 2020-04-26 ENCOUNTER — Other Ambulatory Visit: Payer: Self-pay

## 2020-04-26 DIAGNOSIS — R2681 Unsteadiness on feet: Secondary | ICD-10-CM

## 2020-04-26 DIAGNOSIS — R2689 Other abnormalities of gait and mobility: Secondary | ICD-10-CM

## 2020-04-26 DIAGNOSIS — R262 Difficulty in walking, not elsewhere classified: Secondary | ICD-10-CM

## 2020-04-26 NOTE — Therapy (Signed)
[place] MAIN [facility] SERVICES [address] [place], [place], [address] Phone: [phone]   Fax:  [phone]  Physical Therapy Treatment  Patient Details  Name: Susan Salas MRN: [id number] Date of Birth: [birth year] Referring Provider (PT): Ramonita Lab, MD   Encounter Date: 04/26/2020   PT End of Session - 04/26/20 1803    Visit Number 11    Number of Visits 33    Date for PT Re-Evaluation 06/07/20    Authorization Type Evaluation: 02/16/20, FOTO completed    Authorization Time Period --    PT Start Time 1354    PT Stop Time 1430    PT Time Calculation (min) 36 min    Equipment Utilized During Treatment Gait belt    Activity Tolerance Patient tolerated treatment well;No increased pain;Patient limited by fatigue    Behavior During Therapy [facility] for tasks assessed/performed           Past Medical History:  Diagnosis Date  . Cancer ([place]) 2017   ovarian  . CHF (congestive heart failure) ([place])   . Complication of anesthesia    hard to wake from general anesthesia, long porcedures nausea and vomiting.  Marland Kitchen Hypertension   . Hypothyroidism   . PONV (postoperative nausea and vomiting)   . Spinal headache    with labor of 55 year old son.    Past Surgical History:  Procedure Laterality Date  . ABDOMINAL HYSTERECTOMY    . AMPUTATION TOE Left 05/02/2017   Procedure: AMPUTATION TOE/Left 4th mpj/28820;  Surgeon: Sharlotte Alamo, DPM;  Location: [facility] ORS;  Service: Podiatry;  Laterality: Left;  . APPENDECTOMY    . BACK SURGERY  2014   lamenectomy  . BREAST SURGERY Bilateral 2003   reduction  . CATARACT EXTRACTION W/ INTRAOCULAR LENS IMPLANT Bilateral 2014  . CHOLECYSTECTOMY    . COLON SURGERY  2017  . FRACTURE SURGERY Right 2003   screws & plates  . TONSILLECTOMY      There were no vitals filed for this visit.   Subjective Assessment - 04/26/20 1800    Subjective Patient reports medication has not been going well. She was put on a new  medication that made her cognition worse and she is now being tapered off the medication. She reports her tremors are worse than before. She reports doing her HEP. No other updates or concerns at this time.    Pertinent History Pt reports bilateral LE weakness and difficulty with her balance. She has a follow-up appointment with Dr. Manuella Ghazi in the coming weeks. She has a history of laminectomy approximately 4 years ago in "my entire back." She reports severe muscle pain in BLE when walking but worse on the R side. She complains of "swelling in the top of both legs or maybe its just fat." Per note from Dr. 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 Continues to follow with oncology. She is on letrozole (nonsteroidal aromatase inhibitors), with CA-125 monitored periodically. Has not been able to be quite as active over the [place] pandemic. She was previously seen in this clinic earlier in 2021 for R hip pain. Per PT note at that time pt with 3 years of pain in right hip (and global pain) since onset of estrogen blocking medication s/p CA diagnosis.  How long can you stand comfortably? Unlimited but has worsening of low back pain and low back fatigue.    How long can you walk comfortably? Estimated to be about 1-2 minutes (limited by leg pain)    Diagnostic tests Pt believes that lumbar MRI was approximately 3 years ago    Patient Stated Goals Pt would like to be able to walk farther    Currently in Pain? Yes    Pain Score 5     Pain Location Back            TREATMENT     Neuromuscular Re-education: CGA for stabilization with cues for safety awareness and body mechanics  Airex pads sandwiched with 6" step, lateral step up/down 10x each direction, patient cued  to brace abs when stepping up and down;   Standing soccer ball kicks for pertubation's, single limb stability, spatial awareness, and timing, SPT performing CGA while PT provide ball kicks x multiple bouts, cued for no UE support;  Matrix resisted gait 12.5# forward/backward/R lateral x 2 each direction, L lateral x 1 each direction, cued for taking big steps and a controlled descent; had to stop due to increase in back pain.  Standing on airex pad tapping tennis ball off cone x multiple bouts, cued to tap softly and for no UE support, difficult for patient to tap the tennis ball only and not the cone;  Step over foam roller throws/catches with basketball, multiple bouts; more challenging stepping backwards with occasional LOB, SPT performing CGA with PT providing ball toss, cued for no UE suport;     Pt educated throughout session about proper posture and technique with exercises. Improved exercise technique, movement at target joints, use of target muscles after min to mod verbal, visual, tactile cues.    Pt demonstrates excellent motivation during today. Patient arrived late, so session was abbreviated. Session focused on balance exercises. Added more dynamic exercises with big steps to increase difficulty as her balance has improved. Continued working on single leg stability with the tennis ball and cone exercise. Single leg stability and dual task challenges are areas for continued focus. Next visit will look at back pain. Pt encouraged to continue her HEP and follow-up as scheduled. Pt will benefit from PT services to address deficits in strength, balance, and mobility in order to return to full function at home.      PT Short Term Goals - 04/12/20 1807      PT SHORT TERM GOAL #1   Title Patient will be independent in home exercise program to improve strength, mobility, and balance in order to improve function at home    Baseline 04/12/20: Patient is performing exercises at home.    Time  4    Period Weeks    Status Achieved    Target Date 03/15/20             PT Long Term Goals - 04/12/20 1808      PT LONG TERM GOAL #1   Title Pt will improve BERG by at least 3 points in order to demonstrate clinically significant improvement in balance.    Baseline 02/16/20: 51/56; 04/12/20: 53/56    Time 8    Period Weeks    Status Partially Met      PT LONG TERM GOAL #2   Title Pt will improve ABC by at least 13% in order to demonstrate clinically significant improvement in balance confidence.    Baseline 02/16/20: 41.9%; 04/12/20: 68.75%  Time 8    Period Weeks    Status Achieved      PT LONG TERM GOAL #3   Title Pt will decrease 5TSTS by at least 3 seconds in order to demonstrate clinically significant improvement in LE strength.    Baseline 02/16/20: 19.8s; 04/12/20: 11 secs    Time 8    Period Weeks    Status Achieved      PT LONG TERM GOAL #4   Title Pt will improve FOTO to at least 64 in order to demonstrate clinically significant improvement in function at home.    Baseline 02/16/20: 55; 04/12/20:    Time 8    Period Weeks    Status New                 Plan - 04/26/20 1803    Clinical Impression Statement Pt demonstrates excellent motivation during today. Patient arrived late, so session was abbreviated. Session focused on balance exercises. Added more dynamic exercises with big steps to increase difficulty as her balance has improved. Continued working on single leg stability with the tennis ball and cone exercise. Single leg stability and dual task challenges are areas for continued focus. Next visit will look at back pain. Pt encouraged to continue her HEP and follow-up as scheduled. Pt will benefit from PT services to address deficits in strength, balance, and mobility in order to return to full function at home.    Personal Factors and Comorbidities Age;Time since onset of injury/illness/exacerbation;Comorbidity 3+    Comorbidities CA, osteoporosis, lumbar  stenosis, hypothyroid    Examination-Activity Limitations Stairs;Carry;Locomotion Level;Squat;Transfers;Stand    Examination-Participation Restrictions Yard Work;Community Activity;Shop    Stability/Clinical Decision Making Evolving/Moderate complexity    Rehab Potential Fair    PT Frequency 2x / week    PT Duration 8 weeks    PT Treatment/Interventions Patient/family education;Therapeutic exercise;Balance training;Therapeutic activities;Gait training;ADLs/Self Care Home Management;Electrical Stimulation;Moist Heat;DME Instruction;Stair training;Functional mobility training;Taping;Passive range of motion;Dry needling;Joint Manipulations;Aquatic Therapy;Biofeedback;Canalith Repostioning;Cryotherapy;Iontophoresis 72m/ml Dexamethasone;Neuromuscular re-education;Manual techniques;Vestibular    PT Next Visit Plan Next session, look at her back    PT HV[place] [id number]   Consulted and Agree with Plan of Care Patient           Patient will benefit from skilled therapeutic intervention in order to improve the following deficits and impairments:  Pain, Decreased strength, Difficulty walking, Abnormal gait, Decreased balance, Decreased activity tolerance  Visit Diagnosis: Unsteadiness on feet  Other abnormalities of gait and mobility  Difficulty in walking, not elsewhere classified     Problem List There are no problems to display for this patient.  This entire session was performed under direct supervision and direction of a licensed therapist/therapist assistant . I have personally read, edited and approve of the note as written.   KNoemi Chapel SPT JPhillips GroutPT, DPT, GCS  Huprich,Jason 04/27/2020, 1:16 PM  [facility]MAIN [facility]Johnson [facility] LPSERVICES [address] [address]Phone: [phone]  Fax:  [phone] Name: Susan GEIGLEMRN: [id number] of Birth: 5Dec 22, 1937

## 2020-05-01 ENCOUNTER — Ambulatory Visit: Payer: Medicare Other

## 2020-05-01 ENCOUNTER — Other Ambulatory Visit: Payer: Self-pay

## 2020-05-01 DIAGNOSIS — R2681 Unsteadiness on feet: Secondary | ICD-10-CM | POA: Diagnosis not present

## 2020-05-01 DIAGNOSIS — M25551 Pain in right hip: Secondary | ICD-10-CM

## 2020-05-01 DIAGNOSIS — R262 Difficulty in walking, not elsewhere classified: Secondary | ICD-10-CM

## 2020-05-01 DIAGNOSIS — M6281 Muscle weakness (generalized): Secondary | ICD-10-CM

## 2020-05-01 DIAGNOSIS — M25651 Stiffness of right hip, not elsewhere classified: Secondary | ICD-10-CM

## 2020-05-01 DIAGNOSIS — R2689 Other abnormalities of gait and mobility: Secondary | ICD-10-CM

## 2020-05-01 NOTE — Therapy (Signed)
[place] MAIN [facility] SERVICES [address] [place], [place], [address] Phone: [phone]   Fax:  [phone]  Physical Therapy Treatment  Patient Details  Name: Susan Salas MRN: [id number] Date of Birth: 1935-12-04 Referring Provider (PT): Ramonita Lab, MD   Encounter Date: 05/01/2020   PT End of Session - 05/01/20 1708    Visit Number 12    Number of Visits 33    Date for PT Re-Evaluation 06/07/20    Authorization Type Evaluation: 02/16/20, FOTO completed    Authorization Time Period 02/16/20-04/12/20    PT Start Time 1630    PT Stop Time 1700    PT Time Calculation (min) 30 min    Equipment Utilized During Treatment Gait belt    Activity Tolerance Patient tolerated treatment well    Behavior During Therapy [facility] for tasks assessed/performed           Past Medical History:  Diagnosis Date  . Cancer ([place]) 2017   ovarian  . CHF (congestive heart failure) ([place])   . Complication of anesthesia    hard to wake from general anesthesia, long porcedures nausea and vomiting.  Marland Kitchen Hypertension   . Hypothyroidism   . PONV (postoperative nausea and vomiting)   . Spinal headache    with labor of 47 year old son.    Past Surgical History:  Procedure Laterality Date  . ABDOMINAL HYSTERECTOMY    . AMPUTATION TOE Left 05/02/2017   Procedure: AMPUTATION TOE/Left 4th mpj/28820;  Surgeon: Sharlotte Alamo, DPM;  Location: [facility] ORS;  Service: Podiatry;  Laterality: Left;  . APPENDECTOMY    . BACK SURGERY  2014   lamenectomy  . BREAST SURGERY Bilateral 2003   reduction  . CATARACT EXTRACTION W/ INTRAOCULAR LENS IMPLANT Bilateral 2014  . CHOLECYSTECTOMY    . COLON SURGERY  2017  . FRACTURE SURGERY Right 2003   screws & plates  . TONSILLECTOMY      There were no vitals filed for this visit.   Subjective Assessment - 05/01/20 1704    Subjective Patient stated she is feeling better than her previous session, no complaints today.    Pertinent  History Pt reports bilateral LE weakness and difficulty with her balance. She has a follow-up appointment with Dr. Manuella Ghazi in the coming weeks. She has a history of laminectomy approximately 4 years ago in "my entire back." She reports severe muscle pain in BLE when walking but worse on the R side. She complains of "swelling in the top of both legs or maybe its just fat." Per note from Dr. 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 Continues to follow with oncology. She is on letrozole (nonsteroidal aromatase inhibitors), with CA-125 monitored periodically. Has not been able to be quite as active over the [place] pandemic. She was previously seen in this clinic earlier in 2021 for R hip pain. Per PT note at that time pt with 3 years of pain in right hip (and global pain) since onset of estrogen blocking medication s/p CA diagnosis.    How long can you stand comfortably? Unlimited but has worsening of low back pain and low back fatigue.    How long can you walk comfortably? Estimated to be about 1-2 minutes (limited by leg pain)    Diagnostic tests  Pt believes that lumbar MRI was approximately 3 years ago    Patient Stated Goals Pt would like to be able to walk farther    Currently in Pain? No/denies          TREATMENT     Neuromuscular Re-education: CGA for stabilization with cues for safety awareness and body mechanics   From blue foam to normal step with blue foam on top x10 bilaterally CGA/cueing for technique, unilateral UE support, cueing for sequencing  Standing on airex pad tapping tennis ball off cone x multiple bouts, cued to tap softly and for no UE support, difficult for patient to tap the tennis ball only and not the cone;   4 square stepping over  walking sticks, PT cueing for randomized stepping into squares x 6 minutes, difficulty with foot clearance and positioning especially with diagonal stepping/backwards stepping  Obstacle course in hallway involving agility ladder (initially forward then transitioned to sideways stepping for remaining laps), two hurdles, step over shoe box, weaving in/out of cones, and stepping up onto 6" step and turning 180degrees back towards obstacle course. Several bouts     Pt response/clinical impression: Pt demonstrated excellent motivation during session, shortened due to pt late arrival. Pt challenged by foot clearance, step length, and unilateral LE activities. Pt provided with multimodal cueing during session to maximize technique/balance strategies. The patient would benefit from further skilled PT intervention to continue to progress towards goals.      PT Education - 05/01/20 1704    Education Details exercise technique, balance strategies    Person(s) Educated Patient    Methods Explanation;Demonstration;Tactile cues;Verbal cues    Comprehension Verbalized understanding;Returned demonstration;Verbal cues required;Tactile cues required            PT Short Term Goals - 04/12/20 1807      PT SHORT TERM GOAL #1   Title Patient will be independent in home exercise program to improve strength, mobility, and balance in order to improve function at home    Baseline 04/12/20: Patient is performing exercises at home.    Time 4    Period Weeks    Status Achieved    Target Date 03/15/20             PT Long Term Goals - 04/12/20 1808      PT LONG TERM GOAL #1   Title Pt will improve BERG by at least 3 points in order to demonstrate clinically significant improvement in balance.    Baseline 02/16/20: 51/56; 04/12/20: 53/56    Time 8    Period Weeks    Status Partially Met      PT LONG TERM GOAL #2   Title Pt will improve ABC by at least 13% in order to demonstrate clinically significant  improvement in balance confidence.    Baseline 02/16/20: 41.9%; 04/12/20: 68.75%    Time 8    Period Weeks    Status Achieved      PT LONG TERM GOAL #3   Title Pt will decrease 5TSTS by at least 3 seconds in order to demonstrate clinically significant improvement in LE strength.    Baseline 02/16/20: 19.8s; 04/12/20: 11 secs    Time 8    Period Weeks    Status Achieved      PT LONG TERM GOAL #4   Title Pt will improve FOTO to at least 64 in order to demonstrate clinically significant improvement in function at home.    Baseline 02/16/20: 55; 04/12/20:  Time 8    Period Weeks    Status New                 Plan - 05/01/20 1633    Clinical Impression Statement Pt demonstrated excellent motivation during session, shortened due to pt late arrival. Pt challenged by foot clearance, step length, and unilateral LE activities. Pt provided with multimodal cueing during session to maximize technique/balance strategies. The patient would benefit from further skilled PT intervention to continue to progress towards goals.    Personal Factors and Comorbidities Age;Time since onset of injury/illness/exacerbation;Comorbidity 3+    Comorbidities CA, osteoporosis, lumbar stenosis, hypothyroid    Examination-Activity Limitations Stairs;Carry;Locomotion Level;Squat;Transfers;Stand    Examination-Participation Restrictions Yard Work;Community Activity;Shop    Stability/Clinical Decision Making Evolving/Moderate complexity    Rehab Potential Fair    PT Frequency 2x / week    PT Duration 8 weeks    PT Treatment/Interventions Patient/family education;Therapeutic exercise;Balance training;Therapeutic activities;Gait training;ADLs/Self Care Home Management;Electrical Stimulation;Moist Heat;DME Instruction;Stair training;Functional mobility training;Taping;Passive range of motion;Dry needling;Joint Manipulations;Aquatic Therapy;Biofeedback;Canalith Repostioning;Cryotherapy;Iontophoresis 18m/ml  Dexamethasone;Neuromuscular re-education;Manual techniques;Vestibular    PT Next Visit Plan Next session, look at her back    PT [place] [email or web address]   Consulted and Agree with Plan of Care Patient           Patient will benefit from skilled therapeutic intervention in order to improve the following deficits and impairments:  Pain, Decreased strength, Difficulty walking, Abnormal gait, Decreased balance, Decreased activity tolerance  Visit Diagnosis: Unsteadiness on feet  Other abnormalities of gait and mobility  Difficulty in walking, not elsewhere classified  Pain in right hip  Stiffness of right hip, not elsewhere classified  Muscle weakness (generalized)     Problem List There are no problems to display for this patient.   DLieutenant DiegoPT, DPT 5:09 PM,05/01/20   [place] [facility] [address] [address]Phone: [phone]  Fax:  [phone] Name: MBAYLIN CABALMRN: [id number] of Birth: 5January 06, 1937

## 2020-05-03 ENCOUNTER — Ambulatory Visit: Payer: Medicare Other | Attending: Internal Medicine

## 2020-05-03 ENCOUNTER — Other Ambulatory Visit: Payer: Self-pay

## 2020-05-03 DIAGNOSIS — M25551 Pain in right hip: Secondary | ICD-10-CM | POA: Diagnosis not present

## 2020-05-03 DIAGNOSIS — M5441 Lumbago with sciatica, right side: Secondary | ICD-10-CM | POA: Insufficient documentation

## 2020-05-03 DIAGNOSIS — G8929 Other chronic pain: Secondary | ICD-10-CM | POA: Diagnosis present

## 2020-05-03 NOTE — Patient Instructions (Signed)
Access Code: [email or web address] URL: [email or web address] Date: 05/03/2020 Prepared by: Roxana Hires  Exercises Supine Single Knee to Chest Stretch - 2 x daily - 7 x weekly - 3 reps - 45s hold Supine Lower Trunk Rotation - 2 x daily - 7 x weekly - 5 reps - 10s hold Seated Piriformis Stretch - 2 x daily - 7 x weekly - 3 reps - 45s hold

## 2020-05-03 NOTE — Therapy (Signed)
[place] MAIN [facility] SERVICES [address] [place], [place], [address] Phone: [phone]   Fax:  [phone]  Physical Therapy Treatment  Patient Details  Name: Susan Salas MRN: [id number] Date of Birth: [birth year] Referring Provider (PT): Ramonita Lab, MD   Encounter Date: 05/03/2020   PT End of Session - 05/03/20 1533    Visit Number 13    Number of Visits 33    Date for PT Re-Evaluation 06/07/20    Authorization Type Evaluation: 02/16/20, FOTO completed,    PT Start Time 1347    PT Stop Time 1430    PT Time Calculation (min) 43 min    Equipment Utilized During Treatment Gait belt    Activity Tolerance Patient tolerated treatment well    Behavior During Therapy [facility] for tasks assessed/performed           Past Medical History:  Diagnosis Date  . Cancer ([place]) 2017   ovarian  . CHF (congestive heart failure) ([place])   . Complication of anesthesia    hard to wake from general anesthesia, long porcedures nausea and vomiting.  Marland Kitchen Hypertension   . Hypothyroidism   . PONV (postoperative nausea and vomiting)   . Spinal headache    with labor of 13 year old son.    Past Surgical History:  Procedure Laterality Date  . ABDOMINAL HYSTERECTOMY    . AMPUTATION TOE Left 05/02/2017   Procedure: AMPUTATION TOE/Left 4th mpj/28820;  Surgeon: Sharlotte Alamo, DPM;  Location: [facility] ORS;  Service: Podiatry;  Laterality: Left;  . APPENDECTOMY    . BACK SURGERY  2014   lamenectomy  . BREAST SURGERY Bilateral 2003   reduction  . CATARACT EXTRACTION W/ INTRAOCULAR LENS IMPLANT Bilateral 2014  . CHOLECYSTECTOMY    . COLON SURGERY  2017  . FRACTURE SURGERY Right 2003   screws & plates  . TONSILLECTOMY      There were no vitals filed for this visit.   Subjective Assessment - 05/03/20 1350    Subjective Pt reports that she is doing alright today. No specific questions or concerns upon arrival. Agreeable to assessment of low back today.    Pertinent  History Pt reports 3 years of pain in right hip/low back pain since onset of estrogen blocking medication s/p CA. Pt reports that she had a lumbar laminectomy in 2014 in addition to a concurrent neck surgery. This all occurred prior to her CA diagnosis. At that time she was having pain in her both arms and both legs. Following the surgery her pain improved. She states that she had a hysterectomy in the 50's but the doctor left part of her ovaries. When she had abdominal surgery for her ovarian cancer it had metastasized to her intestines and she had to have 27" of her intestines removed. She reports severe muscle pain in BLE when walking but worse on the R side. She complains of "swelling in the top of both legs or maybe its just fat." Per note from Dr. 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 Continues to follow with oncology. She is on letrozole (nonsteroidal aromatase inhibitors), with CA-125 monitored periodically. She was previously seen in this clinic earlier in 2021 and evaluated for R hip pain. She has a NCV test upcoming in the next few  weeks.    How long can you stand comfortably? Unlimited but has worsening of low back pain and low back fatigue.    How long can you walk comfortably? Estimated to be about 1-2 minutes (limited by leg pain)    Diagnostic tests Pt believes that lumbar MRI was approximately 3 years ago    Patient Stated Goals Pt would like to be able to walk farther    Currently in Pain? Yes    Pain Score 4    Worst: 10/10, Best: 0/10   Pain Location Back    Pain Orientation Right;Left;Lower    Pain Descriptors / Indicators Aching    Pain Type Chronic pain    Pain Radiating Towards R posterior hip and thigh to the mid thigh    Pain  Onset More than a month ago    Pain Frequency Intermittent    Aggravating Factors  Extended standing, walking,    Pain Relieving Factors Sitting, bending                History: Pt reports 3 years of pain in right hip/low back pain since onset of estrogen blocking medication s/p CA. Pt reports that she had a lumbar laminectomy in 2014 in addition to a concurrent neck surgery. This all occurred prior to her CA diagnosis. At that time she was having pain in her both arms and both legs. Following the surgery her pain improved. She states that she had a hysterectomy in the 50's but the doctor left part of her ovaries. When she had abdominal surgery for her ovarian cancer it had metastasized to her intestines and she had to have 27" of her intestines removed. She reports severe muscle pain in BLE when walking but worse on the R side. She complains of "swelling in the top of both legs or maybe its just fat." Per note from Dr. 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 Continues to follow with oncology. She is on letrozole (nonsteroidal aromatase inhibitors), with CA-125 monitored periodically. She was previously seen in this clinic earlier in 2021 and evaluated for R hip pain. She has a NCV test upcoming in the next few weeks.  Pain location: bilateral low back near SI joints, radiates down posterior RLE to mid thigh Pain: Present 4/10, Best 0/10, Worst 10/10: Pain quality: pain quality: aching Radiating pain: Yes  24 hour pain behavior: Worse in the evening; Aggravating factors: Extended standing, walking,  Easing factors: Sitting, bending History of back injury, pain, surgery, or therapy: Yes lumbar laminectomy, cervical surgery Imaging: Yes 03/30/20:  Lumbar XR: Images reveal degenerative changes throughout the lumbar spine. Loss of  disc space noted at L4-L5 and L 5-S1. No fractures. R hip XR: Images reveal mild loss of femoral acetabular joint space without significant osteophyte formation. No fractures noted. Lumbar MRI: 02/08/15: Severe spinal canal stenosis at L3-L4 and borderline canal stenosis at L4-L5.  Red flags (bowel/bladder changes, saddle paresthesia, personal history of cancer, chills/fever, night sweats, unrelenting pain, first onset of insidious LBP <20 y/o) Negative    OBJECTIVE  MUSCULOSKELETAL: Tremor: None Bulk: Normal Tone: Normal  Posture Lumbar lordosis: Difficult to assess due to lumbar laminectomy with spinous processes absent Iliac crest height: equal bilaterally Lumbar lateral shift: negative  Gait Wide base shuffling gait with decrease step length   and speed   Palpation Significant pain to palpation along R lumbar paraspinals and R posterior hip as well as bilateral fortin area over SIJ.  Strength (out of 5) R/L 5/5 Hip flexion 5/5 Knee extension 5/5 Knee flexion (sitting) 4/4 Ankle dorsiflexion Active bilateral ankle plantarflexion *Indicates pain   AROM (degrees) R/L (all movements include overpressure unless otherwise stated) Lumbar forward flexion (65): Grossly WNL, relieves pain Lumbar extension (30): Moderate limitation, pain with overpressure Lumbar lateral flexion (25): Mild limitation and pain with overpressure on contralateral side; Thoracic and Lumbar rotation (30 degrees): Mild limitation bilateral, painfree Hip IR (0-45): Gross loss of bilateral hip IR Hip ER (0-45): Appears to have mild loss but grossly WNL, pain-free. Slight more decrease in R hip during FABER position; Hip extension: decreased bilaterally; Hip Flexion (0-125): Reproduction of low back pain with end range R hip flexion and overpressure Hip Abduction (0-40): Decreased hip abduction PROM bilaterally;  Repeated  Movements Symptoms abolished with lumbar flexion   Muscle Length Hamstrings: R: 75 degrees L: 75 degrees (gross estimate) [place]: Decreased quad length bilaterally;    Passive Accessory Intervertebral Motion (PAIVM) Lumbar mobilizations deferred due absence of spinous processes from lumbar laminectomy. No reproduction of pain with CPA of thoracic spine  SPECIAL TESTS Lumbar Radiculopathy and Discogenic: Centralization and Peripheralization (SN 92, -LR 0.12): Positive, reduction in pain with flexion Slump (SN 83, -LR 0.32): R: Negative L: Negative SLR (SN 92, -LR 0.29): R: Negative L:  Negative Crossed SLR (SP 90): R: Negative L: Negative  Facet Joint: Extension-Rotation (SN 100, -LR 0.0): R: Not done L: Not done  Lumbar Spinal Stenosis: Lumbar quadrant (SN 70): R: Not examined L: Not examined  Hip: FABER (SN 81): R: Positive L: Negative FADIR (SN 94): R: Positive L: Negative Hip scour (SN 50): R: Positive L: Positive  SIJ:  Thigh Thrust (SN 88, -LR 0.18) : R: Not examined L: Not examined  Piriformis Syndrome: FAIR Test (SN 88, SP 83): R: Positive L: Negative                    PT Education - 05/03/20 1533    Education Details Plan of care and HEP    Person(s) Educated Patient    Methods Explanation    Comprehension Verbalized understanding            PT Short Term Goals - 04/12/20 1807      PT SHORT TERM GOAL #1   Title Patient will be independent in home exercise program to improve strength, mobility, and balance in order to improve function at home    Baseline 04/12/20: Patient is performing exercises at home.    Time 4    Period Weeks    Status Achieved    Target Date 03/15/20             PT Long Term Goals - 05/03/20 1538      PT LONG TERM GOAL #1   Title Pt will improve BERG by at least 3 points in order to demonstrate clinically significant improvement in balance.    Baseline 02/16/20: 51/56; 04/12/20: 53/56    Time 8    Period Weeks      Status Partially Met    Target Date 06/07/20      PT LONG TERM GOAL #2   Title Pt will improve ABC by at least 13% in order to demonstrate clinically significant improvement in balance confidence.    Baseline 02/16/20: 41.9%; 04/12/20: 68.75%  Time 8    Period Weeks    Status Achieved      PT LONG TERM GOAL #3   Title Pt will decrease 5TSTS by at least 3 seconds in order to demonstrate clinically significant improvement in LE strength.    Baseline 02/16/20: 19.8s; 04/12/20: 11 secs    Time 8    Period Weeks    Status Achieved      PT LONG TERM GOAL #4   Title Pt will improve FOTO to at least 64 in order to demonstrate clinically significant improvement in function at home.    Baseline 02/16/20: 55; 04/12/20:    Time 8    Period Weeks    Status On-going    Target Date 06/07/20      PT LONG TERM GOAL #5   Title Pt will report at least 2 point decrease in worst back pain on NPRS in order to demonstrate clinically significant reduction in pain    Baseline 05/03/20: worst: 10/10    Time 8    Period Weeks    Status New    Target Date 06/07/20                 Plan - 05/03/20 1534    Clinical Impression Statement Pt agrees to assessment of low back today so it can bee added to her plan of care. Pt has a lot of pain to palpation along lumbar paraspinals and posterior R hip as well as over bilateral SIJ. Pain relief with R hip distraction. She has pain in back with lumbar extension and lateral flexion overpressure and relief of pain with spinal flexion and seated positions. No discogenic findings however there is concern for lumbar stenosis given previous history of stenosis, bilateral symptoms, and bilateral LE neuropathy. She will benefit from skilled PT services to address deficits and return to pain-free function at home.    Personal Factors and Comorbidities Age;Time since onset of injury/illness/exacerbation;Comorbidity 3+    Comorbidities CA, osteoporosis, lumbar stenosis,  hypothyroid    Examination-Activity Limitations Stairs;Carry;Locomotion Level;Squat;Transfers;Stand    Examination-Participation Restrictions Yard Work;Community Activity;Shop    Stability/Clinical Decision Making Evolving/Moderate complexity    Clinical Decision Making Moderate    Rehab Potential Fair    PT Frequency 2x / week    PT Duration 8 weeks    PT Treatment/Interventions Patient/family education;Therapeutic exercise;Balance training;Therapeutic activities;Gait training;ADLs/Self Care Home Management;Electrical Stimulation;Moist Heat;DME Instruction;Stair training;Functional mobility training;Taping;Passive range of motion;Dry needling;Joint Manipulations;Aquatic Therapy;Biofeedback;Canalith Repostioning;Cryotherapy;Iontophoresis 4mg/ml Dexamethasone;Neuromuscular re-education;Manual techniques;Vestibular    PT Next Visit Plan Review back HEP, STM to low back and R hip, hip mobilizations, lumbar and abdominal strengthening    PT Home Exercise Plan Balance Medbridge: [email or web address]; Low back medbridge: Access Code: [email or web address]    Consulted and Agree with Plan of Care Patient           Patient will benefit from skilled therapeutic intervention in order to improve the following deficits and impairments:  Pain, Difficulty walking, Abnormal gait, Decreased balance, Decreased activity tolerance, Decreased strength  Visit Diagnosis: Pain in right hip  Chronic bilateral low back pain with right-sided sciatica     Problem List There are no problems to display for this patient.   D  PT, DPT, GCS  , 05/03/2020, 4:00 PM  [facility] [address] [place], [place], [address] Phone: [phone]   Fax:  [phone]  Name: Susan Salas MRN: [id number] Date of Birth: 09/03/1935   

## 2020-05-10 ENCOUNTER — Ambulatory Visit: Payer: Medicare Other

## 2020-05-10 ENCOUNTER — Other Ambulatory Visit: Payer: Self-pay

## 2020-05-10 DIAGNOSIS — M25551 Pain in right hip: Secondary | ICD-10-CM | POA: Diagnosis not present

## 2020-05-10 DIAGNOSIS — G8929 Other chronic pain: Secondary | ICD-10-CM

## 2020-05-10 DIAGNOSIS — M5441 Lumbago with sciatica, right side: Secondary | ICD-10-CM

## 2020-05-10 NOTE — Therapy (Signed)
[place] MAIN [facility] SERVICES [address] [place], [place], [address] Phone: [phone]   Fax:  [phone]  Physical Therapy Treatment  Patient Details  Name: Susan Salas MRN: [id number] Date of Birth: 10/11/1935 Referring Provider (PT): Ramonita Lab, MD   Encounter Date: 05/10/2020   PT End of Session - 05/10/20 1532    Visit Number 14    Number of Visits 33    Date for PT Re-Evaluation 06/07/20    Authorization Type Evaluation: 02/16/20, FOTO completed,    PT Start Time 1515    PT Stop Time 1600    PT Time Calculation (min) 45 min    Equipment Utilized During Treatment Gait belt    Activity Tolerance Patient tolerated treatment well    Behavior During Therapy [facility] for tasks assessed/performed           Past Medical History:  Diagnosis Date  . Cancer ([place]) 2017   ovarian  . CHF (congestive heart failure) ([place])   . Complication of anesthesia    hard to wake from general anesthesia, long porcedures nausea and vomiting.  Marland Kitchen Hypertension   . Hypothyroidism   . PONV (postoperative nausea and vomiting)   . Spinal headache    with labor of 70 year old son.    Past Surgical History:  Procedure Laterality Date  . ABDOMINAL HYSTERECTOMY    . AMPUTATION TOE Left 05/02/2017   Procedure: AMPUTATION TOE/Left 4th mpj/28820;  Surgeon: Sharlotte Alamo, DPM;  Location: [facility] ORS;  Service: Podiatry;  Laterality: Left;  . APPENDECTOMY    . BACK SURGERY  2014   lamenectomy  . BREAST SURGERY Bilateral 2003   reduction  . CATARACT EXTRACTION W/ INTRAOCULAR LENS IMPLANT Bilateral 2014  . CHOLECYSTECTOMY    . COLON SURGERY  2017  . FRACTURE SURGERY Right 2003   screws & plates  . TONSILLECTOMY      There were no vitals filed for this visit.   Subjective Assessment - 05/10/20 1532    Subjective Pt reports that she is doing alright today. No specific questions or concerns upon arrival.  Denies any back pain upon arrival today.  Patient reports  that she did not notice a difference immediately following the therapy visit last week but the following day her back pain was improved.    Pertinent History Pt reports 3 years of pain in right hip/low back pain since onset of estrogen blocking medication s/p CA. Pt reports that she had a lumbar laminectomy in 2014 in addition to a concurrent neck surgery. This all occurred prior to her CA diagnosis. At that time she was having pain in her both arms and both legs. Following the surgery her pain improved. She states that she had a hysterectomy in the 50's but the doctor left part of her ovaries. When she had abdominal surgery for her ovarian cancer it had metastasized to her intestines and she had to have 27" of her intestines removed. She reports severe muscle pain in BLE when walking but worse on the R side. She complains of "swelling in the top of both legs or maybe its just fat." Per note from Dr. 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 Continues to follow with oncology. She is on letrozole (nonsteroidal aromatase inhibitors), with CA-125 monitored  periodically. She was previously seen in this clinic earlier in 2021 and evaluated for R hip pain. She has a NCV test upcoming in the next few weeks.    How long can you stand comfortably? Unlimited but has worsening of low back pain and low back fatigue.    How long can you walk comfortably? Estimated to be about 1-2 minutes (limited by leg pain)    Diagnostic tests Pt believes that lumbar MRI was approximately 3 years ago    Patient Stated Goals Pt would like to be able to walk farther    Currently in Pain? No/denies    Pain Onset --               TREATMENT   Manual Therapy  NuStep L2 x 5 minutes for  warm-up during history (unbilled); Belt assisted long axis right hip distraction with oscillation moving through passive range of motion abduction 30 seconds per bout x3 bouts; Belt assisted inferior right hip mobilization at 90 degrees flexion, grade 3, 30 seconds per bout x3 bouts; Belt assisted medial to lateral right hip mobilizations at 45 degrees of flexion, grade 3, 30 seconds per bout x3 bouts; Right hip IR and ER stretches 2 x 30 seconds each; Right hamstring stretch 30s hold x 2; STM right lower lumbar paraspinals and QL with trigger point release, notable trigger points and reproduction of patients pain;  Patient reports significant improvement in her back pain since the last visit.  Utilized hip mobilizations as well as soft tissue mobilization to right lower lumbar paraspinals and QL with trigger point release.  Patient with notable pain during palpation of paraspinals.  Patient denies any pain at the end of the session.  Patient encouraged to continue HEP and follow-up as scheduled. Pt will benefit from PT services to address deficits in strength, balance, pain, and mobility in order to return to full function at home.                                PT Short Term Goals - 04/12/20 1807      PT SHORT TERM GOAL #1   Title Patient will be independent in home exercise program to improve strength, mobility, and balance in order to improve function at home    Baseline 04/12/20: Patient is performing exercises at home.    Time 4    Period Weeks    Status Achieved    Target Date 03/15/20             PT Long Term Goals - 05/03/20 1538      PT LONG TERM GOAL #1   Title Pt will improve BERG by at least 3 points in order to demonstrate clinically significant improvement in balance.    Baseline 02/16/20: 51/56; 04/12/20: 53/56    Time 8    Period Weeks    Status Partially Met    Target Date 06/07/20      PT LONG TERM GOAL #2   Title Pt will improve ABC by  at least 13% in order to demonstrate clinically significant improvement in balance confidence.    Baseline 02/16/20: 41.9%; 04/12/20: 68.75%    Time 8    Period Weeks    Status Achieved      PT LONG TERM GOAL #3   Title Pt will decrease 5TSTS by at least 3 seconds in order to demonstrate clinically significant improvement in LE strength.  Baseline 02/16/20: 19.8s; 04/12/20: 11 secs    Time 8    Period Weeks    Status Achieved      PT LONG TERM GOAL #4   Title Pt will improve FOTO to at least 64 in order to demonstrate clinically significant improvement in function at home.    Baseline 02/16/20: 55; 04/12/20:    Time 8    Period Weeks    Status On-going    Target Date 06/07/20      PT LONG TERM GOAL #5   Title Pt will report at least 2 point decrease in worst back pain on NPRS in order to demonstrate clinically significant reduction in pain    Baseline 05/03/20: worst: 10/10    Time 8    Period Weeks    Status New    Target Date 06/07/20                 Plan - 05/10/20 1533    Clinical Impression Statement Patient reports significant improvement in her back pain since the last visit.  Utilized hip mobilizations as well as soft tissue mobilization to right lower lumbar paraspinals and QL with trigger point release.  Patient with notable pain during palpation of paraspinals.  Patient denies any pain at the end of the session.  Patient encouraged to continue HEP and follow-up as scheduled. Pt will benefit from PT services to address deficits in strength, balance, pain, and mobility in order to return to full function at home.    Personal Factors and Comorbidities Age;Time since onset of injury/illness/exacerbation;Comorbidity 3+    Comorbidities CA, osteoporosis, lumbar stenosis, hypothyroid    Examination-Activity Limitations Stairs;Carry;Locomotion Level;Squat;Transfers;Stand    Examination-Participation Restrictions Yard Work;Community Activity;Shop    Stability/Clinical Decision  Making Evolving/Moderate complexity    Rehab Potential Fair    PT Frequency 2x / week    PT Duration 8 weeks    PT Treatment/Interventions Patient/family education;Therapeutic exercise;Balance training;Therapeutic activities;Gait training;ADLs/Self Care Home Management;Electrical Stimulation;Moist Heat;DME Instruction;Stair training;Functional mobility training;Taping;Passive range of motion;Dry needling;Joint Manipulations;Aquatic Therapy;Biofeedback;Canalith Repostioning;Cryotherapy;Iontophoresis 26m/ml Dexamethasone;Neuromuscular re-education;Manual techniques;Vestibular    PT Next Visit Plan Review back HEP, STM to low back and R hip, hip mobilizations, lumbar and abdominal strengthening    PT Home Exercise Plan Balance Medbridge: [email or web address] Low back medbridge: Access Code: [email or web address]   Consulted and Agree with Plan of Care Patient           Patient will benefit from skilled therapeutic intervention in order to improve the following deficits and impairments:  Pain, Difficulty walking, Abnormal gait, Decreased balance, Decreased activity tolerance, Decreased strength  Visit Diagnosis: Pain in right hip  Chronic bilateral low back pain with right-sided sciatica     Problem List There are no problems to display for this patient.  JPhillips GroutPT, DPT, GCS  Heike Pounds 05/11/2020, 9:36 AM  [place] [facility] [address] [place] [address]Phone: [phone]  Fax:  [phone] Name: MANEESA ROMEYMRN: [id number] of Birth: [birth year]

## 2020-05-15 ENCOUNTER — Ambulatory Visit: Payer: Medicare Other

## 2020-05-15 ENCOUNTER — Other Ambulatory Visit: Payer: Self-pay

## 2020-05-15 DIAGNOSIS — M25551 Pain in right hip: Secondary | ICD-10-CM | POA: Diagnosis not present

## 2020-05-15 DIAGNOSIS — G8929 Other chronic pain: Secondary | ICD-10-CM

## 2020-05-15 NOTE — Therapy (Signed)
[place] MAIN [facility] SERVICES [address] [place], [place], [address] Phone: [phone]   Fax:  [phone]  Physical Therapy Treatment  Patient Details  Name: MAYLIE ASHTON MRN: [id number] Date of Birth: [birth year] Referring Provider (PT): Ramonita Lab, MD   Encounter Date: 05/15/2020   PT End of Session - 05/15/20 1656    Visit Number 15    Number of Visits 33    Date for PT Re-Evaluation 06/07/20    Authorization Type Evaluation: 02/16/20, FOTO completed,    PT Start Time 1647    PT Stop Time 1730    PT Time Calculation (min) 43 min    Equipment Utilized During Treatment Gait belt    Activity Tolerance Patient tolerated treatment well    Behavior During Therapy [facility] for tasks assessed/performed           Past Medical History:  Diagnosis Date  . Cancer ([place]) 2017   ovarian  . CHF (congestive heart failure) ([place])   . Complication of anesthesia    hard to wake from general anesthesia, long porcedures nausea and vomiting.  Marland Kitchen Hypertension   . Hypothyroidism   . PONV (postoperative nausea and vomiting)   . Spinal headache    with labor of 82 year old son.    Past Surgical History:  Procedure Laterality Date  . ABDOMINAL HYSTERECTOMY    . AMPUTATION TOE Left 05/02/2017   Procedure: AMPUTATION TOE/Left 4th mpj/28820;  Surgeon: Sharlotte Alamo, DPM;  Location: [facility] ORS;  Service: Podiatry;  Laterality: Left;  . APPENDECTOMY    . BACK SURGERY  2014   lamenectomy  . BREAST SURGERY Bilateral 2003   reduction  . CATARACT EXTRACTION W/ INTRAOCULAR LENS IMPLANT Bilateral 2014  . CHOLECYSTECTOMY    . COLON SURGERY  2017  . FRACTURE SURGERY Right 2003   screws & plates  . TONSILLECTOMY      There were no vitals filed for this visit.   Subjective Assessment - 05/15/20 1653    Subjective Pt reports that she is doing alright today however she has had a lot of personal stressors and she is having generalized body pain. She is  complaining of 5/10 low back pain upon arrival. She reports relief for approximately 1 day following last therapy session.  No specific questions upon arrival    Pertinent History Pt reports 3 years of pain in right hip/low back pain since onset of estrogen blocking medication s/p CA. Pt reports that she had a lumbar laminectomy in 2014 in addition to a concurrent neck surgery. This all occurred prior to her CA diagnosis. At that time she was having pain in her both arms and both legs. Following the surgery her pain improved. She states that she had a hysterectomy in the 50's but the doctor left part of her ovaries. When she had abdominal surgery for her ovarian cancer it had metastasized to her intestines and she had to have 27" of her intestines removed. She reports severe muscle pain in BLE when walking but worse on the R side. She complains of "swelling in the top of both legs or maybe its just fat." Per note from Dr. 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 Continues to follow with oncology. She is on letrozole (nonsteroidal aromatase inhibitors), with  CA-125 monitored periodically. She was previously seen in this clinic earlier in 2021 and evaluated for R hip pain. She has a NCV test upcoming in the next few weeks.    How long can you stand comfortably? Unlimited but has worsening of low back pain and low back fatigue.    How long can you walk comfortably? Estimated to be about 1-2 minutes (limited by leg pain)    Diagnostic tests Pt believes that lumbar MRI was approximately 3 years ago    Patient Stated Goals Pt would like to be able to walk farther    Currently in Pain? Yes    Pain Score 5     Pain Location Back    Pain Orientation Right;Left;Lower     Pain Descriptors / Indicators Aching    Pain Type Chronic pain    Pain Radiating Towards R posterior hip and thigh to the mid thigh    Pain Onset More than a month ago    Pain Frequency Intermittent                TREATMENT   Manual Therapy  NuStep L2-4 x 5 minutes for warm-up during history (3 minutes unbilled); Bilateral hip IR and ER stretches x 30 seconds each; Bilateral single knee to chest stretch x 30s each; Bilateral hamstring stretch x 30s each; Bilateral lumbar rotation stretch with therapist blocking contralateral shoulder x 30s each direction; Belt assisted long axis right hip distraction with oscillation moving through passive range of motion abduction 30 seconds per bout x3 bouts; Belt assisted inferior right hip mobilization at 90 degrees flexion, grade 3, 30 seconds per bout x3 bouts; Belt assisted medial to lateral right hip mobilizations at 45 degrees of flexion, grade 3, 30 seconds per bout x3 bouts; STM bilateral lower lumbar paraspinals and QL with trigger point release, notable trigger points and reproduction of patients pain;   Ther-ex  Anterior/posterior pelvic tilts 5s hold x 10 each direction; Hooklying partial bridges x 10 with therapist encouraging patient to focus on contracting gluts; Hooklying isometric lumbar rotation with resistance at knees 5s hold x 10 each direction;   Pt educated throughout session about proper posture and technique with exercises. Improved exercise technique, movement at target joints, use of target muscles after min to mod verbal, visual, tactile cues.    Patient reports significant improvement in her back pain at the end of the session. Utilized hip mobilizations as well as soft tissue mobilization to bilateral lower lumbar paraspinals and QL with trigger point release.  Patient with notable pain during palpation of paraspinals.  Incorporated more active strengthening today including anterior/posterior pelvic tilts,  hook lying bridges, and hook lying isometric resisted rotation.  Patient encouraged to continue HEP and follow-up as scheduled. Pt will benefit from PT services to address deficits in strength, balance, pain, and mobility in order to return to full function at home.                        PT Short Term Goals - 04/12/20 1807      PT SHORT TERM GOAL #1   Title Patient will be independent in home exercise program to improve strength, mobility, and balance in order to improve function at home    Baseline 04/12/20: Patient is performing exercises at home.    Time 4    Period Weeks    Status Achieved    Target Date 03/15/20  PT Long Term Goals - 05/03/20 1538      PT LONG TERM GOAL #1   Title Pt will improve BERG by at least 3 points in order to demonstrate clinically significant improvement in balance.    Baseline 02/16/20: 51/56; 04/12/20: 53/56    Time 8    Period Weeks    Status Partially Met    Target Date 06/07/20      PT LONG TERM GOAL #2   Title Pt will improve ABC by at least 13% in order to demonstrate clinically significant improvement in balance confidence.    Baseline 02/16/20: 41.9%; 04/12/20: 68.75%    Time 8    Period Weeks    Status Achieved      PT LONG TERM GOAL #3   Title Pt will decrease 5TSTS by at least 3 seconds in order to demonstrate clinically significant improvement in LE strength.    Baseline 02/16/20: 19.8s; 04/12/20: 11 secs    Time 8    Period Weeks    Status Achieved      PT LONG TERM GOAL #4   Title Pt will improve FOTO to at least 64 in order to demonstrate clinically significant improvement in function at home.    Baseline 02/16/20: 55; 04/12/20:    Time 8    Period Weeks    Status On-going    Target Date 06/07/20      PT LONG TERM GOAL #5   Title Pt will report at least 2 point decrease in worst back pain on NPRS in order to demonstrate clinically significant reduction in pain    Baseline 05/03/20: worst: 10/10     Time 8    Period Weeks    Status New    Target Date 06/07/20                 Plan - 05/15/20 1656    Clinical Impression Statement Patient reports significant improvement in her back pain at the end of the session. Utilized hip mobilizations as well as soft tissue mobilization to bilateral lower lumbar paraspinals and QL with trigger point release.  Patient with notable pain during palpation of paraspinals.  Incorporated more active strengthening today including anterior/posterior pelvic tilts, hook lying bridges, and hook lying isometric resisted rotation.  Patient encouraged to continue HEP and follow-up as scheduled. Pt will benefit from PT services to address deficits in strength, balance, pain, and mobility in order to return to full function at home.    Personal Factors and Comorbidities Age;Time since onset of injury/illness/exacerbation;Comorbidity 3+    Comorbidities CA, osteoporosis, lumbar stenosis, hypothyroid    Examination-Activity Limitations Stairs;Carry;Locomotion Level;Squat;Transfers;Stand    Examination-Participation Restrictions Yard Work;Community Activity;Shop    Stability/Clinical Decision Making Evolving/Moderate complexity    Rehab Potential Fair    PT Frequency 2x / week    PT Duration 8 weeks    PT Treatment/Interventions Patient/family education;Therapeutic exercise;Balance training;Therapeutic activities;Gait training;ADLs/Self Care Home Management;Electrical Stimulation;Moist Heat;DME Instruction;Stair training;Functional mobility training;Taping;Passive range of motion;Dry needling;Joint Manipulations;Aquatic Therapy;Biofeedback;Canalith Repostioning;Cryotherapy;Iontophoresis 60m/ml Dexamethasone;Neuromuscular re-education;Manual techniques;Vestibular    PT Next Visit Plan Review back HEP, STM to low back and R hip, hip mobilizations, lumbar and abdominal strengthening    PT Home Exercise Plan Balance Medbridge: [email or web address] Low back medbridge: Access Code:  [email or web address]   Consulted and Agree with Plan of Care Patient           Patient will benefit from skilled therapeutic intervention in order to improve the following deficits and impairments:  Pain, Difficulty walking, Abnormal gait, Decreased balance, Decreased activity tolerance, Decreased strength  Visit Diagnosis: Pain in right hip  Chronic bilateral low back pain with right-sided sciatica     Problem List There are no problems to display for this patient.  Phillips Grout PT, DPT, GCS  Delana Manganello 05/15/2020, 5:35 PM  [place] MAIN [facility] SERVICES [address] [place], [place], [address] Phone: [phone]   Fax:  [phone]  Name: ALIEYAH SPADER MRN: [id number] Date of Birth: [birth year]

## 2020-05-17 ENCOUNTER — Other Ambulatory Visit: Payer: Self-pay

## 2020-05-17 ENCOUNTER — Ambulatory Visit: Payer: Medicare Other

## 2020-05-17 DIAGNOSIS — G8929 Other chronic pain: Secondary | ICD-10-CM

## 2020-05-17 DIAGNOSIS — M25551 Pain in right hip: Secondary | ICD-10-CM

## 2020-05-17 NOTE — Therapy (Signed)
[place] MAIN [facility] SERVICES [address] [place], [place], [address] Phone: [phone]   Fax:  [phone]  Physical Therapy Treatment  Patient Details   Name: Susan Salas MRN: [id number] Date of Birth: 12/30/1935 Referring Provider (PT): Ramonita Lab, MD   Encounter Date: 05/17/2020   PT End of Session - 05/17/20 1649    Visit Number 16    Number of Visits 33    Date for PT Re-Evaluation 06/07/20    Authorization Type Evaluation: 02/16/20, FOTO completed,    PT Start Time 1645    PT Stop Time 1730    PT Time Calculation (min) 45 min    Equipment Utilized During Treatment Gait belt    Activity Tolerance Patient tolerated treatment well    Behavior During Therapy [facility] for tasks assessed/performed           Past Medical History:  Diagnosis Date  . Cancer ([place]) 2017   ovarian  . CHF (congestive heart failure) ([place])   . Complication of anesthesia    hard to wake from general anesthesia, long porcedures nausea and vomiting.  Marland Kitchen Hypertension   . Hypothyroidism   . PONV (postoperative nausea and vomiting)   . Spinal headache    with labor of 82 year old son.    Past Surgical History:  Procedure Laterality Date  . ABDOMINAL HYSTERECTOMY    . AMPUTATION TOE Left 05/02/2017   Procedure: AMPUTATION TOE/Left 4th mpj/28820;  Surgeon: Sharlotte Alamo, DPM;  Location: [facility] ORS;  Service: Podiatry;  Laterality: Left;  . APPENDECTOMY    . BACK SURGERY  2014   lamenectomy  . BREAST SURGERY Bilateral 2003   reduction  . CATARACT EXTRACTION W/ INTRAOCULAR LENS IMPLANT Bilateral 2014  . CHOLECYSTECTOMY    . COLON SURGERY  2017  . FRACTURE SURGERY Right 2003   screws & plates  . TONSILLECTOMY      There were no vitals filed for this visit.   Subjective Assessment - 05/17/20 1647    Subjective Pt reports that she is doing well today.  Denies any back pain upon arrival.  She reports soreness after last therapy session however pain was gone  by the following day.  No specific questions upon arrival    Pertinent History Pt reports 3 years of pain in right hip/low back pain since onset of estrogen blocking medication s/p CA. Pt reports that she had a lumbar laminectomy in 2014 in addition to a concurrent neck surgery. This all occurred prior to her CA diagnosis. At that time she was having pain in her both arms and both legs. Following the surgery her pain improved. She states that she had a hysterectomy in the 50's but the doctor left part of her ovaries. When she had abdominal surgery for her ovarian cancer it had metastasized to her intestines and she had to have 27" of her intestines removed. She reports severe muscle pain in BLE when walking but worse on the R side. She complains of "swelling in the top of both legs or maybe its just fat." Per note from Dr. 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 Continues to follow with oncology. She is on letrozole (nonsteroidal aromatase inhibitors), with CA-125 monitored periodically. She was previously seen in this clinic earlier in 2021  and evaluated for R hip pain. She has a NCV test upcoming in the next few weeks.    How long can you stand comfortably? Unlimited but has worsening of low back pain and low back fatigue.    How long can you walk comfortably? Estimated to be about 1-2 minutes (limited by leg pain)    Diagnostic tests Pt believes that lumbar MRI was approximately 3 years ago    Patient Stated Goals Pt would like to be able to walk farther    Currently in Pain? No/denies               TREATMENT   Manual Therapy NuStep L2-4x 5 minutes for warm-up during history (4 minutes unbilled); Bilateral hip IR and ER stretches x 30  seconds each; Bilateral single knee to chest stretch x 30s each; Bilateral hamstring stretch x 30seach; Bilateral lumbar rotation stretch with therapist blocking contralateral shoulder x 30s each direction; Belt assisted long axis right hip distraction with oscillation moving through passive range of motion abduction 30 seconds per bout x3 bouts; Belt assisted inferior right hip mobilization at 90 degrees flexion, grade 3, 30 secondsper boutx3 bouts; Belt assisted medial to lateral right hip mobilizations at 45 degrees of flexion, grade 3, 30 seconds per bout x3 bouts; STM bilaterallowerlumbarparaspinals and QL with trigger point release, notable trigger points and reproduction of patients pain;   Ther-ex  Anterior/posterior pelvic tilts 5s hold x 10 each direction; Hooklying clams with manual resistance 3s hold x 10; Hooklying adductor squeezes with manual resistance 3s hold x 10; Hooklying partial bridges x 10 with therapist encouraging patient to focus on contracting gluts; Hooklying alternating heel taps starting from 90/90 hip/knee position x 10 each;;   Pt educated throughout session about proper posture and technique with exercises. Improved exercise technique, movement at target joints, use of target muscles after min to mod verbal, visual, tactile cues.    Patient reports significant improvement in her back pain at the end of the session.Utilized hip mobilizations as well as soft tissue mobilization to bilateral lower lumbar paraspinals and QL with trigger point release. Patient with notable pain during palpation of paraspinals.  Incorporated more active strengthening today including anterior/posterior pelvic tilts, hook lying bridges, hooklying clams, and alternating heel taps. Patient encouraged to continue HEP and follow-up as scheduled. Pt will benefit from PT services to address deficits in strength, balance,pain,and mobility in order to return to full function at  home.                        PT Short Term Goals - 04/12/20 1807      PT SHORT TERM GOAL #1   Title Patient will be independent in home exercise program to improve strength, mobility, and balance in order to improve function at home    Baseline 04/12/20: Patient is performing exercises at home.    Time 4    Period Weeks    Status Achieved    Target Date 03/15/20             PT Long Term Goals - 05/03/20 1538      PT LONG TERM GOAL #1   Title Pt will improve BERG by at least 3 points in order to demonstrate clinically significant improvement in balance.    Baseline 02/16/20: 51/56; 04/12/20: 53/56    Time 8    Period Weeks    Status Partially Met    Target Date 06/07/20  PT LONG TERM GOAL #2   Title Pt will improve ABC by at least 13% in order to demonstrate clinically significant improvement in balance confidence.    Baseline 02/16/20: 41.9%; 04/12/20: 68.75%    Time 8    Period Weeks    Status Achieved      PT LONG TERM GOAL #3   Title Pt will decrease 5TSTS by at least 3 seconds in order to demonstrate clinically significant improvement in LE strength.    Baseline 02/16/20: 19.8s; 04/12/20: 11 secs    Time 8    Period Weeks    Status Achieved      PT LONG TERM GOAL #4   Title Pt will improve FOTO to at least 64 in order to demonstrate clinically significant improvement in function at home.    Baseline 02/16/20: 55; 04/12/20:    Time 8    Period Weeks    Status On-going    Target Date 06/07/20      PT LONG TERM GOAL #5   Title Pt will report at least 2 point decrease in worst back pain on NPRS in order to demonstrate clinically significant reduction in pain    Baseline 05/03/20: worst: 10/10    Time 8    Period Weeks    Status New    Target Date 06/07/20                 Plan - 05/17/20 1649    Clinical Impression Statement Patient reports significant improvement in her back pain at the end of the session. Utilized hip mobilizations as  well as soft tissue mobilization to bilateral lower lumbar paraspinals and QL with trigger point release.  Patient with notable pain during palpation of paraspinals.  Incorporated more active strengthening today including anterior/posterior pelvic tilts, hook lying bridges, hooklying clams, and alternating heel taps. Patient encouraged to continue HEP and follow-up as scheduled. Pt will benefit from PT services to address deficits in strength, balance, pain, and mobility in order to return to full function at home.    Personal Factors and Comorbidities Age;Time since onset of injury/illness/exacerbation;Comorbidity 3+    Comorbidities CA, osteoporosis, lumbar stenosis, hypothyroid    Examination-Activity Limitations Stairs;Carry;Locomotion Level;Squat;Transfers;Stand    Examination-Participation Restrictions Yard Work;Community Activity;Shop    Stability/Clinical Decision Making Evolving/Moderate complexity    Rehab Potential Fair    PT Frequency 2x / week    PT Duration 8 weeks    PT Treatment/Interventions Patient/family education;Therapeutic exercise;Balance training;Therapeutic activities;Gait training;ADLs/Self Care Home Management;Electrical Stimulation;Moist Heat;DME Instruction;Stair training;Functional mobility training;Taping;Passive range of motion;Dry needling;Joint Manipulations;Aquatic Therapy;Biofeedback;Canalith Repostioning;Cryotherapy;Iontophoresis 23m/ml Dexamethasone;Neuromuscular re-education;Manual techniques;Vestibular    PT Next Visit Plan Review back HEP, STM to low back and R hip, hip mobilizations, lumbar and abdominal strengthening    PT Home Exercise Plan Balance Medbridge: [email or web address] Low back medbridge: Access Code: [email or web address]   Consulted and Agree with Plan of Care Patient           Patient will benefit from skilled therapeutic intervention in order to improve the following deficits and impairments:  Pain, Difficulty walking, Abnormal gait, Decreased balance, Decreased  activity tolerance, Decreased strength  Visit Diagnosis: Pain in right hip  Chronic bilateral low back pain with right-sided sciatica     Problem List There are no problems to display for this patient.  JPhillips GroutPT, DPT, GCS  Gemini Beaumier 05/17/2020, 5:36 PM  CFultonMAIN RSurical [facility] LLCSERVICES [address]RMount Pleasant [place] [address]Phone: [phone]  Fax:  [phone]  Name: MERLYN BOLLEN MRN: [id number] Date of Birth: 12/24/1935

## 2020-05-24 ENCOUNTER — Ambulatory Visit: Payer: Medicare Other

## 2020-06-05 ENCOUNTER — Ambulatory Visit: Payer: Medicare Other

## 2020-06-07 ENCOUNTER — Other Ambulatory Visit: Payer: Self-pay

## 2020-06-07 ENCOUNTER — Ambulatory Visit: Payer: Medicare Other | Attending: Internal Medicine

## 2020-06-07 DIAGNOSIS — M25651 Stiffness of right hip, not elsewhere classified: Secondary | ICD-10-CM

## 2020-06-07 DIAGNOSIS — R2689 Other abnormalities of gait and mobility: Secondary | ICD-10-CM

## 2020-06-07 DIAGNOSIS — M25551 Pain in right hip: Secondary | ICD-10-CM

## 2020-06-07 DIAGNOSIS — M6281 Muscle weakness (generalized): Secondary | ICD-10-CM

## 2020-06-07 DIAGNOSIS — G8929 Other chronic pain: Secondary | ICD-10-CM

## 2020-06-07 DIAGNOSIS — M5441 Lumbago with sciatica, right side: Secondary | ICD-10-CM | POA: Insufficient documentation

## 2020-06-07 DIAGNOSIS — R2681 Unsteadiness on feet: Secondary | ICD-10-CM

## 2020-06-07 DIAGNOSIS — R262 Difficulty in walking, not elsewhere classified: Secondary | ICD-10-CM

## 2020-06-07 NOTE — Therapy (Signed)
Susan Salas [facility] SERVICES [address] [place], [place], [address] Phone: [phone]   Fax:  [phone]  Physical Therapy Treatment/Reassessment   Patient Details  Name: Susan Salas MRN: [id number] Date of Birth: 25-Dec-1935 Referring Provider (PT): Ramonita Lab, MD   Encounter Date: 06/07/2020   PT End of Session - 06/07/20 1610    Visit Number 17    Number of Visits 33    Date for PT Re-Evaluation 06/07/20    Authorization Type Evaluation: 02/16/20, FOTO completed,    Authorization Time Period 02/16/20-04/12/20    PT Start Time 1602    PT Stop Time 1642    PT Time Calculation (min) 40 min    Activity Tolerance Patient tolerated treatment well;No increased pain    Behavior During Therapy WFL for tasks assessed/performed           Past Medical History:  Diagnosis Date  . Cancer ([employer]) 2017   ovarian  . CHF (congestive heart failure) ([place])   . Complication of anesthesia    hard to wake from general anesthesia, long porcedures nausea and vomiting.  Marland Kitchen Hypertension   . Hypothyroidism   . PONV (postoperative nausea and vomiting)   . Spinal headache    with labor of 82 year old son.    Past Surgical History:  Procedure Laterality Date  . ABDOMINAL HYSTERECTOMY    . AMPUTATION TOE Left 05/02/2017   Procedure: AMPUTATION TOE/Left 4th mpj/28820;  Surgeon: Sharlotte Alamo, DPM;  Location: [facility] ORS;  Service: Podiatry;  Laterality: Left;  . APPENDECTOMY    . BACK SURGERY  2014   lamenectomy  . BREAST SURGERY Bilateral 2003   reduction  . CATARACT EXTRACTION W/ INTRAOCULAR LENS IMPLANT Bilateral 2014  . CHOLECYSTECTOMY    . COLON SURGERY  2017  . FRACTURE SURGERY Right 2003   screws & plates  . TONSILLECTOMY      There were no vitals filed for this visit.   Subjective Assessment - 06/07/20 1607    Subjective Pt reports a rough 2-3 days, having been more fatigued due to FLU shot, COVID shot, OP injection, nerve study, and  another procedure. Rt knee has been sore and with shooting pain into foot during STS transfers.    Pertinent History Pt reports 3 years of pain in right hip/low back pain since onset of estrogen blocking medication s/p CA. Pt reports that she had a lumbar laminectomy in 2014 in addition to a concurrent neck surgery. This all occurred prior to her CA diagnosis. At that time she was having pain in her both arms and both legs. Following the surgery her pain improved. She states that she had a hysterectomy in the 50's but the doctor left part of her ovaries. When she had abdominal surgery for her ovarian cancer it had metastasized to her intestines and she had to have 27" of her intestines removed. She reports severe muscle pain in BLE when walking but worse on the R side. She complains of "swelling in the top of both legs or maybe its just fat." Per note from Dr. 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 Continues to follow with oncology. She is on letrozole (nonsteroidal aromatase inhibitors), with CA-125 monitored periodically. She was previously seen in this clinic earlier in 2021  and evaluated for R hip pain. She has a NCV test upcoming in the next few weeks.    How long can you stand comfortably? Not limited anymore (much improved)    How long can you walk comfortably? Estimated to be about 1-2 minutes (limited by leg pain) Unchanged since re-eval    Currently in Pain? Yes    Pain Score 5     Pain Location --   Right knee   Pain Orientation Right              [facility] PT Assessment - 06/07/20 0001      Observation/Other Assessments   Focus on Therapeutic Outcomes (FOTO)  49/100   more limited by recent OP injection side effectx          -5xSTS:  26sec hands free from chair -seated LAQ 1x15 bilat  -[employer] seated eliptical trainer for AA/ROM of bilat knees/hips  -Hooklying SKTC stretch P/ROM 2x30sec Bilat -Hooklying Lower trunk rotation fall out 2x30sec bilat (pillow between knees)      PT Short Term Goals - 04/12/20 1807      PT SHORT TERM GOAL #1   Title Patient will be independent in home exercise program to improve strength, mobility, and balance in order to improve function at home    Baseline 04/12/20: Patient is performing exercises at home.    Time 4    Period Weeks    Status Achieved    Target Date 03/15/20             PT Long Term Goals - 06/07/20 1618      PT LONG TERM GOAL #1   Title Pt will improve BERG by at least 3 points in order to demonstrate clinically significant improvement in balance.    Baseline 02/16/20: 51/56; 04/12/20: 53/56    Time 8    Period Weeks    Status Partially Met    Target Date 06/07/20      PT LONG TERM GOAL #2   Title Pt will improve ABC by at least 13% in order to demonstrate clinically significant improvement in balance confidence.    Baseline 02/16/20: 41.9%; 04/12/20: 68.75%    Time 8    Period Weeks    Status Achieved    Target Date 04/12/20      PT LONG TERM GOAL #3   Title Pt will decrease 5TSTS by at least 3 seconds in order to demonstrate clinically significant improvement in LE strength.    Baseline 02/16/20: 19.8s; 04/12/20: 11 secs; 06/07/20: 26.22sec (pain in bilat thighs, knee, shins, ankles)    Time 8    Period Weeks    Status Achieved    Target Date 04/12/20      PT LONG TERM GOAL #4   Title Pt will improve FOTO to at least 64 in order to demonstrate clinically significant improvement in function at home.    Baseline 02/16/20: 55; 04/12/20: 59; 06/07/20: 46    Time 8    Period Weeks    Status On-going    Target Date 06/07/20      PT LONG TERM GOAL #5   Title Pt will report at least 2 point decrease in worst back pain on NPRS in order to demonstrate  clinically significant reduction in pain    Baseline 05/03/20: worst: 10/10    Time 8    Period Weeks    Status On-going    Target Date 06/07/20  Plan - 06/07/20 1630    Clinical Impression Statement Reassessmen tthis date. Pt has been feeling more sore and fatigued lastcouple weeks due side effect from her twice annual injections for OP. Reassessment shows some increased difficulty and decreased performance since 3-4 weeks prior, this despite earlier signs of improvement and progress. Pt will continue to bnefit from skilled PT assessment to improve strength and activity tolerance in order to improve independence with ADL, IADL, and decrease falls risk. Pt remains motivated to work toward achieving these goals and returning to PLOF.    Personal Factors and Comorbidities Age;Time since onset of injury/illness/exacerbation;Comorbidity 3+    Comorbidities CA, osteoporosis, lumbar stenosis, hypothyroid    Examination-Activity Limitations Stairs;Carry;Locomotion Level;Squat;Transfers;Stand    Examination-Participation Restrictions Yard Work;Community Activity;Shop    Stability/Clinical Decision Making Evolving/Moderate complexity    Clinical Decision Making Moderate    Rehab Potential Fair    PT Frequency 2x / week    PT Duration 8 weeks    PT Treatment/Interventions Patient/family education;Therapeutic exercise;Balance training;Therapeutic activities;Gait training;ADLs/Self Care Home Management;Electrical Stimulation;Moist Heat;DME Instruction;Stair training;Functional mobility training;Taping;Passive range of motion;Dry needling;Joint Manipulations;Aquatic Therapy;Biofeedback;Canalith Repostioning;Cryotherapy;Iontophoresis 20m/ml Dexamethasone;Neuromuscular re-education;Manual techniques;Vestibular    PT Next Visit Plan Recert needed: review back HEP, STM to low back and R hip, hip mobilizations, lumbar and abdominal strengthening    PT Home Exercise Plan Balance Medbridge:  [email or web address] Low back medbridge: Access Code: [email or web address]   Consulted and Agree with Plan of Care Patient           Patient will benefit from skilled therapeutic intervention in order to improve the following deficits and impairments:  Pain, Difficulty walking, Abnormal gait, Decreased balance, Decreased activity tolerance, Decreased strength  Visit Diagnosis: Pain in right hip  Chronic bilateral low back pain with right-sided sciatica  Unsteadiness on feet  Other abnormalities of gait and mobility  Difficulty in walking, not elsewhere classified  Stiffness of right hip, not elsewhere classified  Muscle weakness (generalized)     Problem List There are no problems to display for this patient.  4:43 PM, 06/07/20 AEtta Grandchild PT, DPT Physical Therapist - [phone]    BEtta Grandchild10/02/2020, 4:42 PM  CStone CityMAIN RRiver [facility] LLCSERVICES [address] [address]Phone: [phone]  Fax:  [phone] Name: MGENIECE AKERSMRN: [id number] of Birth: 5November 19, 1937

## 2020-06-12 ENCOUNTER — Ambulatory Visit: Payer: Medicare Other

## 2020-06-12 ENCOUNTER — Other Ambulatory Visit: Payer: Self-pay

## 2020-06-12 DIAGNOSIS — G8929 Other chronic pain: Secondary | ICD-10-CM

## 2020-06-12 DIAGNOSIS — R2681 Unsteadiness on feet: Secondary | ICD-10-CM

## 2020-06-12 DIAGNOSIS — M25551 Pain in right hip: Secondary | ICD-10-CM | POA: Diagnosis not present

## 2020-06-12 NOTE — Therapy (Signed)
[place] MAIN [facility] SERVICES [address] [place], [place], [address] Phone: [phone]   Fax:  [phone]  Physical Therapy Treatment/Recertification  Patient Details  Name: Susan Salas MRN: [id number] Date of Birth: February 06, 1936 Referring Provider (PT): Ramonita Lab, MD   Encounter Date: 06/12/2020   PT End of Session - 06/12/20 1534    Visit Number 18    Number of Visits 55    Date for PT Re-Evaluation 08/07/20    Authorization Type Evaluation: 02/16/20, FOTO completed,    Authorization Time Period 02/16/20-04/12/20    PT Start Time 1525    PT Stop Time 1600    PT Time Calculation (min) 35 min    Activity Tolerance Patient tolerated treatment well;No increased pain    Behavior During Therapy [facility] for tasks assessed/performed           Past Medical History:  Diagnosis Date   Cancer ([place]) 2017   ovarian   CHF (congestive heart failure) (HCC)    Complication of anesthesia    hard to wake from Salas anesthesia, long porcedures nausea and vomiting.   Hypertension    Hypothyroidism    PONV (postoperative nausea and vomiting)    Spinal headache    with labor of 16 year old son.    Past Surgical History:  Procedure Laterality Date   ABDOMINAL HYSTERECTOMY     AMPUTATION TOE Left 05/02/2017   Procedure: AMPUTATION TOE/Left 4th mpj/28820;  Surgeon: Sharlotte Alamo, DPM;  Location: [facility] ORS;  Service: Podiatry;  Laterality: Left;   APPENDECTOMY     BACK SURGERY  2014   lamenectomy   BREAST SURGERY Bilateral 2003   reduction   CATARACT EXTRACTION W/ INTRAOCULAR LENS IMPLANT Bilateral 2014   CHOLECYSTECTOMY     COLON SURGERY  2017   FRACTURE SURGERY Right 2003   screws & plates   TONSILLECTOMY      There were no vitals filed for this visit.   Subjective Assessment - 06/12/20 1532    Subjective Pt reports she is feeling improved since her COVID shot, flu shot, and NCV study. She continues to report R hip/low  back pain around 5/10 today upon arrival. She reports that she didn't take her pain medication prior to coming today. No falls since last therapy session. No specific questions upon arrival.    Pertinent History Pt reports 3 years of pain in right hip/low back pain since onset of estrogen blocking medication s/p CA. Pt reports that she had a lumbar laminectomy in 2014 in addition to a concurrent neck surgery. This all occurred prior to her CA diagnosis. At that time she was having pain in her both arms and both legs. Following the surgery her pain improved. She states that she had a hysterectomy in the 50's but the doctor left part of her ovaries. When she had abdominal surgery for her ovarian cancer it had metastasized to her intestines and she had to have 27" of her intestines removed. She reports severe muscle pain in BLE when walking but worse on the R side. She complains of "swelling in the top of both legs or maybe its just fat." Per note from Dr. 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 Continues to follow with oncology. She is on letrozole (nonsteroidal aromatase inhibitors),  with CA-125 monitored periodically. She was previously seen in this clinic earlier in 2021 and evaluated for R hip pain. She has a NCV test upcoming in the next few weeks.    How long can you stand comfortably? Not limited anymore (much improved)    How long can you walk comfortably? Estimated to be about 1-2 minutes (limited by leg pain) Unchanged since re-eval    Currently in Pain? Yes    Pain Score 5     Pain Location Hip    Pain Orientation Right    Pain Descriptors / Indicators Aching;Shooting    Pain Type Chronic pain    Pain Onset More than a month ago    Pain Frequency  Intermittent              [facility] PT Assessment - 06/12/20 1554      Berg Balance Test   Sit to Stand Able to stand without using hands and stabilize independently    Standing Unsupported Able to stand safely 2 minutes    Sitting with Back Unsupported but Feet Supported on Floor or Stool Able to sit safely and securely 2 minutes    Stand to Sit Sits safely with minimal use of hands    Transfers Able to transfer safely, minor use of hands    Standing Unsupported with Eyes Closed Able to stand 10 seconds safely    Standing Unsupported with Feet Together Able to place feet together independently and stand 1 minute safely    From Standing, Reach Forward with Outstretched Arm Can reach forward >12 cm safely (5")    From Standing Position, Pick up Object from Floor Able to pick up shoe safely and easily    From Standing Position, Turn to Look Behind Over each Shoulder Looks behind from both sides and weight shifts well    Turn 360 Degrees Able to turn 360 degrees safely but slowly    Standing Unsupported, Alternately Place Feet on Step/Stool Able to stand independently and safely and complete 8 steps in 20 seconds    Standing Unsupported, One Foot in Front Able to plae foot ahead of the other independently and hold 30 seconds    Standing on One Leg Able to lift leg independently and hold 5-10 seconds    Total Score 51              TREATMENT   Neuromuscular Re-education  NuStep L2 x 5 minutes for warm-up during history (4 minutes unbilled);  Updated goals and outcome measures with patient (see below) Worst back pain: 5/10 5TSTS: 18.5s ABC: 35% BERG: 51/56; FOTO on 06/07/20: 46  mODI: 32%    Pt demonstrates excellent motivation during session today.She arrived late and had to use the restroom at the beginning of session so time was somewhat limited today. She completed her FOTO questionnaire during last session and it decreased from when it was last updated. She has been  feeling more sore and fatigued over the last couple of weeks due side effect from her twice annual injections for osteoporosis, her COVID shot, and her NCV study. She completed the mODI today and scored 32% disabled related to her low back pain. Her worst pain has decreased from 10/10 at initial back assessment to 5/10 today. Over the last few weeks the focus of therapy has been on patients back/hip pain at her request and unfortunately her balance measures have returned to around pre-therapy numbers when they had previously made dramatic improvement. Discussed with patient  the possibility of performing one session per week directed toward her back pain and the other directed toward her balance so as not to lose the improvements in her balance. She has not yet received maximal benefit from therapy and demonstrates the potential for continued improvement. Pt will benefit from PT services to address deficits in strength, balance, and mobility in order to return to full function at home.           PT Short Term Goals - 06/13/20 0831      PT SHORT TERM GOAL #1   Title Patient will be independent in home exercise program to improve strength, mobility, and balance in order to improve function at home    Baseline 04/12/20: Patient is performing exercises at home.    Time 4    Period Weeks    Status Achieved    Target Date 03/15/20             PT Long Term Goals - 06/13/20 0831      PT LONG TERM GOAL #1   Title Pt will improve BERG by at least 3 points in order to demonstrate clinically significant improvement in balance.    Baseline 02/16/20: 51/56; 04/12/20: 53/56; 06/12/20: 51/56    Time 8    Period Weeks    Status On-going    Target Date 08/07/20      PT LONG TERM GOAL #2   Title Pt will improve ABC by at least 13% in order to demonstrate clinically significant improvement in balance confidence.    Baseline 02/16/20: 41.9%; 04/12/20: 68.75%; 06/13/20: 35%    Time 8    Period Weeks     Status On-going    Target Date 08/07/20      PT LONG TERM GOAL #3   Title Pt will decrease 5TSTS by at least 3 seconds in order to demonstrate clinically significant improvement in LE strength.    Baseline 02/16/20: 19.8s; 04/12/20: 11 secs; 06/07/20: 26.22sec (pain in bilat thighs, knee, shins, ankles); 06/12/20: 18.5s    Time 8    Period Weeks    Status Achieved    Target Date 08/07/20      PT LONG TERM GOAL #4   Title Pt will improve FOTO to at least 64 in order to demonstrate clinically significant improvement in function at home.    Baseline 02/16/20: 55; 04/12/20: 59; 06/07/20: 46    Time 8    Period Weeks    Status On-going    Target Date 08/07/20      PT LONG TERM GOAL #5   Title Pt will report less than 4/10 worst low back pain on NPRS in order to demonstrate clinically significant reduction in pain    Baseline 05/03/20: worst: 10/10; 06/12/20: 5/10;    Time 8    Period Weeks    Status Revised    Target Date 08/07/20      Additional Long Term Goals   Additional Long Term Goals Yes      PT LONG TERM GOAL #6   Title Pt will decrease mODI scoreby at least 13 points in order demonstrate clinically significant reduction in pain/disability    Baseline 06/12/20: 32%    Time 8    Period Weeks    Status New    Target Date 08/07/20                 Plan - 06/12/20 1534    Clinical Impression Statement Pt demonstrates excellent motivation during  session today. She arrived late and had to use the restroom at the beginning of session so time was somewhat limited today. She completed her FOTO questionnaire during last session and it decreased from when it was last updated. She has been feeling more sore and fatigued over the last couple of weeks due side effect from her twice annual injections for osteoporosis, her COVID shot, and her NCV study. She completed the mODI today and scored 32% disabled related to her low back pain. Her worst pain has decreased from 10/10 at initial back  assessment to 5/10 today. Over the last few weeks the focus of therapy has been on patients back/hip pain at her request and unfortunately her balance measures have returned to around pre-therapy numbers when they had previously made dramatic improvement. Discussed with patient the possibility of performing one session per week directed toward her back pain and the other directed toward her balance so as not to lose the improvements in her balance. She has not yet received maximal benefit from therapy and demonstrates the potential for continued improvement. Pt will benefit from PT services to address deficits in strength, balance, and mobility in order to return to full function at home.    Personal Factors and Comorbidities Age;Time since onset of injury/illness/exacerbation;Comorbidity 3+    Comorbidities CA, osteoporosis, lumbar stenosis, hypothyroid    Examination-Activity Limitations Stairs;Carry;Locomotion Level;Squat;Transfers;Stand    Examination-Participation Restrictions Yard Work;Community Activity;Shop    Stability/Clinical Decision Making Evolving/Moderate complexity    Rehab Potential Fair    PT Frequency 2x / week    PT Duration 8 weeks    PT Treatment/Interventions Patient/family education;Therapeutic exercise;Balance training;Therapeutic activities;Gait training;ADLs/Self Care Home Management;Electrical Stimulation;Moist Heat;DME Instruction;Stair training;Functional mobility training;Taping;Passive range of motion;Dry needling;Joint Manipulations;Aquatic Therapy;Biofeedback;Canalith Repostioning;Cryotherapy;Iontophoresis 4mg /ml Dexamethasone;Neuromuscular re-education;Manual techniques;Vestibular    PT Next Visit Plan Recert needed: review back HEP, STM to low back and R hip, hip mobilizations, lumbar and abdominal strengthening    PT Home Exercise Plan Balance Medbridge: [email or web address]; Low back medbridge: Access Code: [email or web address]    Consulted and Agree with Plan of Care Patient            Patient will benefit from skilled therapeutic intervention in order to improve the following deficits and impairments:  Pain, Difficulty walking, Abnormal gait, Decreased balance, Decreased activity tolerance, Decreased strength  Visit Diagnosis: Pain in right hip  Chronic bilateral low back pain with right-sided sciatica  Unsteadiness on feet     Problem List There are no problems to display for this patient.  Phillips Grout PT, DPT, GCS  Jovonni Borquez 06/13/2020, 8:40 AM  [place] MAIN [facility] SERVICES [address] [place], [place], [address] Phone: [phone]   Fax:  [phone]  Name: Susan Salas MRN: [id number] Date of Birth: September 08, 1935

## 2020-06-14 ENCOUNTER — Other Ambulatory Visit: Payer: Self-pay

## 2020-06-14 ENCOUNTER — Ambulatory Visit: Payer: Medicare Other

## 2020-06-14 DIAGNOSIS — G8929 Other chronic pain: Secondary | ICD-10-CM

## 2020-06-14 DIAGNOSIS — M25551 Pain in right hip: Secondary | ICD-10-CM

## 2020-06-14 DIAGNOSIS — M5441 Lumbago with sciatica, right side: Secondary | ICD-10-CM

## 2020-06-14 NOTE — Therapy (Signed)
[place] MAIN [facility] SERVICES [address] [place], [place], [address] Phone: [phone]   Fax:  [phone]  Physical Therapy Treatment  Patient Details  Name: Susan Salas MRN: [id number] Date of Birth: [birth year] Referring Provider (PT): Ramonita Lab, MD   Encounter Date: 06/14/2020   PT End of Session - 06/14/20 1559    Visit Number 19    Number of Visits 67    Date for PT Re-Evaluation 08/07/20    Authorization Type Evaluation: 02/16/20, FOTO completed,    Authorization Time Period 02/16/20-04/12/20    PT Start Time 1600    PT Stop Time 1645    PT Time Calculation (min) 45 min    Activity Tolerance Patient tolerated treatment well;No increased pain    Behavior During Therapy [facility] for tasks assessed/performed           Past Medical History:  Diagnosis Date   Cancer ([place]) 2017   ovarian   CHF (congestive heart failure) (HCC)    Complication of anesthesia    hard to wake from general anesthesia, long porcedures nausea and vomiting.   Hypertension    Hypothyroidism    PONV (postoperative nausea and vomiting)    Spinal headache    with labor of 40 year old son.    Past Surgical History:  Procedure Laterality Date   ABDOMINAL HYSTERECTOMY     AMPUTATION TOE Left 05/02/2017   Procedure: AMPUTATION TOE/Left 4th mpj/28820;  Surgeon: Sharlotte Alamo, DPM;  Location: [facility] ORS;  Service: Podiatry;  Laterality: Left;   APPENDECTOMY     BACK SURGERY  2014   lamenectomy   BREAST SURGERY Bilateral 2003   reduction   CATARACT EXTRACTION W/ INTRAOCULAR LENS IMPLANT Bilateral 2014   CHOLECYSTECTOMY     COLON SURGERY  2017   FRACTURE SURGERY Right 2003   screws & plates   TONSILLECTOMY      There were no vitals filed for this visit.   Subjective Assessment - 06/14/20 1559    Subjective Pt reports that she is feeling well today. She denies any back pain upon arrival because she took pain medication before coming. She  had some improvement in her back pain following last session. No falls since last therapy session. No specific questions upon arrival.    Pertinent History Pt reports 3 years of pain in right hip/low back pain since onset of estrogen blocking medication s/p CA. Pt reports that she had a lumbar laminectomy in 2014 in addition to a concurrent neck surgery. This all occurred prior to her CA diagnosis. At that time she was having pain in her both arms and both legs. Following the surgery her pain improved. She states that she had a hysterectomy in the 50's but the doctor left part of her ovaries. When she had abdominal surgery for her ovarian cancer it had metastasized to her intestines and she had to have 27" of her intestines removed. She reports severe muscle pain in BLE when walking but worse on the R side. She complains of "swelling in the top of both legs or maybe its just fat." Per note from Dr. 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 Continues to follow with oncology. She is on letrozole (nonsteroidal aromatase inhibitors), with CA-125 monitored periodically. She was previously seen  in this clinic earlier in 2021 and evaluated for R hip pain. She has a NCV test upcoming in the next few weeks.    How long can you stand comfortably? Not limited anymore (much improved)    How long can you walk comfortably? Estimated to be about 1-2 minutes (limited by leg pain) Unchanged since re-eval    Currently in Pain? No/denies    Pain Onset --               TREATMENT    Manual Therapy NuStep L2-4x 5 minutes for warm-up during history (4 minutes unbilled); Bilateral hip IR and ER stretches x 30 seconds each; Bilateral single knee to chest stretch x 30s  each; Bilateral hamstring stretch x 30seach; Bilateral lumbar rotation stretch with therapist blocking contralateral shoulder x 30s each direction; Belt assisted long axis right hip distraction with oscillation moving through passive range of motion abduction 30 seconds per bout x3 bouts; Belt assisted inferior right hip mobilization at 90 degrees flexion, grade 3, 30 secondsper boutx3 bouts; Belt assisted medial to lateral right hip mobilizations at 45 degrees of flexion, grade 3, 30 seconds per bout x3 bouts; Lumbar rotation rocking x 10 each direction; STM Rlumbarparaspinals and QL with trigger point release, notable trigger points and reproduction of patients pain;   Ther-ex  Treadmill gait with biofeedback for step length from [employer] and from therapist, constant CGA provided throughout x 5 minutes; Anterior/posterior pelvic tilts 5s hold x 10 each direction; Hooklying clams with manual resistance 3s hold x 10; Hooklying adductor squeezes with manual resistance 3s hold x 10; Hooklying partial bridges x 10 with therapist encouraging patient to focus on contracting gluts; Hooklying alternating marches with coordinated breathing for TrA contraction x 10 BLE; Hooklying alternating heel taps starting from 90/90 hip/knee position x 10 each;;   Pt educated throughout session about proper posture and technique with exercises. Improved exercise technique, movement at target joints, use of target muscles after min to mod verbal, visual, tactile cues.    Patient reports significant improvement in her back pain at the end of the session.Utilized hip mobilizations as well as soft tissue mobilization to bilateral lower lumbar paraspinals and QL with trigger point release. Incorporated more active strengthening today including anterior/posterior pelvic tilts, hook lying bridges, hooklying clams, and alternating heel taps. Also included treadmill walking at the beginning of session to work on  improving step length and gait speed. Patient encouraged to continue HEP and follow-up as scheduled. Pt will benefit from PT services to address deficits in strength, balance,pain,and mobility in order to return to full function at home.                           PT Short Term Goals - 06/13/20 0831      PT SHORT TERM GOAL #1   Title Patient will be independent in home exercise program to improve strength, mobility, and balance in order to improve function at home    Baseline 04/12/20: Patient is performing exercises at home.    Time 4    Period Weeks    Status Achieved    Target Date 03/15/20             PT Long Term Goals - 06/13/20 0831      PT LONG TERM GOAL #1   Title Pt will improve BERG by at least 3 points in order to demonstrate clinically significant improvement in balance.  Baseline 02/16/20: 51/56; 04/12/20: 53/56; 06/12/20: 51/56    Time 8    Period Weeks    Status On-going    Target Date 08/07/20      PT LONG TERM GOAL #2   Title Pt will improve ABC by at least 13% in order to demonstrate clinically significant improvement in balance confidence.    Baseline 02/16/20: 41.9%; 04/12/20: 68.75%; 06/13/20: 35%    Time 8    Period Weeks    Status On-going    Target Date 08/07/20      PT LONG TERM GOAL #3   Title Pt will decrease 5TSTS by at least 3 seconds in order to demonstrate clinically significant improvement in LE strength.    Baseline 02/16/20: 19.8s; 04/12/20: 11 secs; 06/07/20: 26.22sec (pain in bilat thighs, knee, shins, ankles); 06/12/20: 18.5s    Time 8    Period Weeks    Status Achieved    Target Date 08/07/20      PT LONG TERM GOAL #4   Title Pt will improve FOTO to at least 64 in order to demonstrate clinically significant improvement in function at home.    Baseline 02/16/20: 55; 04/12/20: 59; 06/07/20: 46    Time 8    Period Weeks    Status On-going    Target Date 08/07/20      PT LONG TERM GOAL #5   Title Pt will report  less than 4/10 worst low back pain on NPRS in order to demonstrate clinically significant reduction in pain    Baseline 05/03/20: worst: 10/10; 06/12/20: 5/10;    Time 8    Period Weeks    Status Revised    Target Date 08/07/20      Additional Long Term Goals   Additional Long Term Goals Yes      PT LONG TERM GOAL #6   Title Pt will decrease mODI scoreby at least 13 points in order demonstrate clinically significant reduction in pain/disability    Baseline 06/12/20: 32%    Time 8    Period Weeks    Status New    Target Date 08/07/20                 Plan - 06/14/20 1559    Clinical Impression Statement Patient reports significant improvement in her back pain at the end of the session. Utilized hip mobilizations as well as soft tissue mobilization to bilateral lower lumbar paraspinals and QL with trigger point release. Incorporated more active strengthening today including anterior/posterior pelvic tilts, hook lying bridges, hooklying clams, and alternating heel taps. Also included treadmill walking at the beginning of session to work on improving step length and gait speed. Patient encouraged to continue HEP and follow-up as scheduled. Pt will benefit from PT services to address deficits in strength, balance, pain, and mobility in order to return to full function at home.    Personal Factors and Comorbidities Age;Time since onset of injury/illness/exacerbation;Comorbidity 3+    Comorbidities CA, osteoporosis, lumbar stenosis, hypothyroid    Examination-Activity Limitations Stairs;Carry;Locomotion Level;Squat;Transfers;Stand    Examination-Participation Restrictions Yard Work;Community Activity;Shop    Stability/Clinical Decision Making Evolving/Moderate complexity    Rehab Potential Fair    PT Frequency 2x / week    PT Duration 8 weeks    PT Treatment/Interventions Patient/family education;Therapeutic exercise;Balance training;Therapeutic activities;Gait training;ADLs/Self Care Home  Management;Electrical Stimulation;Moist Heat;DME Instruction;Stair training;Functional mobility training;Taping;Passive range of motion;Dry needling;Joint Manipulations;Aquatic Therapy;Biofeedback;Canalith Repostioning;Cryotherapy;Iontophoresis 4mg /ml Dexamethasone;Neuromuscular re-education;Manual techniques;Vestibular    PT Next Visit Plan Progress note;  Review back HEP, STM to low back and R hip, hip mobilizations, lumbar and abdominal strengthening    PT Home Exercise Plan Balance Medbridge: [email or web address]; Low back medbridge: Access Code: [email or web address]    Consulted and Agree with Plan of Care Patient           Patient will benefit from skilled therapeutic intervention in order to improve the following deficits and impairments:  Pain, Difficulty walking, Abnormal gait, Decreased balance, Decreased activity tolerance, Decreased strength  Visit Diagnosis: Pain in right hip  Chronic bilateral low back pain with right-sided sciatica     Problem List There are no problems to display for this patient.  Phillips Grout PT, DPT, GCS  Susan Salas 06/14/2020, 5:04 PM  [place] MAIN [facility] SERVICES [address] [place], [place], [address] Phone: [phone]   Fax:  [phone]  Name: Susan Salas MRN: [id number] Date of Birth: [birth year]

## 2020-06-19 ENCOUNTER — Other Ambulatory Visit: Payer: Self-pay

## 2020-06-19 ENCOUNTER — Ambulatory Visit: Payer: Medicare Other

## 2020-06-19 DIAGNOSIS — R2681 Unsteadiness on feet: Secondary | ICD-10-CM

## 2020-06-19 DIAGNOSIS — M25551 Pain in right hip: Secondary | ICD-10-CM

## 2020-06-19 DIAGNOSIS — G8929 Other chronic pain: Secondary | ICD-10-CM

## 2020-06-19 NOTE — Therapy (Signed)
[place] MAIN [facility] SERVICES [address] [place], [place], [address] Phone: [phone]   Fax:  [phone]  Physical Therapy Progress Note  Dates of reporting period  04/12/20   to   06/19/20  Patient Details  Name: Susan Salas MRN: [id number] Date of Birth: Apr 03, 1936 Referring Provider (PT): Ramonita Lab, MD   Encounter Date: 06/19/2020   PT End of Session - 06/19/20 1554    Visit Number 20    Number of Visits 82    Date for PT Re-Evaluation 08/07/20    Authorization Type Evaluation: 02/16/20, FOTO completed,    Authorization Time Period 02/16/20-04/12/20    PT Start Time 1600    PT Stop Time 1645    PT Time Calculation (min) 45 min    Activity Tolerance Patient tolerated treatment well;No increased pain    Behavior During Therapy WFL for tasks assessed/performed           Past Medical History:  Diagnosis Date  . Cancer ([place]) 2017   ovarian  . CHF (congestive heart failure) ([place])   . Complication of anesthesia    hard to wake from general anesthesia, long porcedures nausea and vomiting.  Marland Kitchen Hypertension   . Hypothyroidism   . PONV (postoperative nausea and vomiting)   . Spinal headache    with labor of 49 year old son.    Past Surgical History:  Procedure Laterality Date  . ABDOMINAL HYSTERECTOMY    . AMPUTATION TOE Left 05/02/2017   Procedure: AMPUTATION TOE/Left 4th mpj/28820;  Surgeon: Sharlotte Alamo, DPM;  Location: [facility] ORS;  Service: Podiatry;  Laterality: Left;  . APPENDECTOMY    . BACK SURGERY  2014   lamenectomy  . BREAST SURGERY Bilateral 2003   reduction  . CATARACT EXTRACTION W/ INTRAOCULAR LENS IMPLANT Bilateral 2014  . CHOLECYSTECTOMY    . COLON SURGERY  2017  . FRACTURE SURGERY Right 2003   screws & plates  . TONSILLECTOMY      There were no vitals filed for this visit.   Subjective Assessment - 06/19/20 1554    Subjective Pt reports that she is feeling alright today. She complains of 4/10 R low  back/hip pain upon arrival. She did take her pain medication today as well. No falls since last therapy session. No specific questions upon arrival.    Pertinent History Pt reports 3 years of pain in right hip/low back pain since onset of estrogen blocking medication s/p CA. Pt reports that she had a lumbar laminectomy in 2014 in addition to a concurrent neck surgery. This all occurred prior to her CA diagnosis. At that time she was having pain in her both arms and both legs. Following the surgery her pain improved. She states that she had a hysterectomy in the 50's but the doctor left part of her ovaries. When she had abdominal surgery for her ovarian cancer it had metastasized to her intestines and she had to have 27" of her intestines removed. She reports severe muscle pain in BLE when walking but worse on the R side. She complains of "swelling in the top of both legs or maybe its just fat." Per note from Dr. 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 Continues to follow with oncology. She is on letrozole (nonsteroidal aromatase inhibitors),  with CA-125 monitored periodically. She was previously seen in this clinic earlier in 2021 and evaluated for R hip pain. She has a NCV test upcoming in the next few weeks.    How long can you stand comfortably? Not limited anymore (much improved)    How long can you walk comfortably? Estimated to be about 1-2 minutes (limited by leg pain) Unchanged since re-eval    Currently in Pain? Yes    Pain Score 4     Pain Location Hip    Pain Orientation Right    Pain Descriptors / Indicators Aching    Pain Type Chronic pain    Pain Onset More than a month ago               TREATMENT    Ther-ex  NuStep L2 x 5 minutes  for warm-up during history; Sit to stand without UE support with 3kg overhead ball press 2 x 10, pt requires intermittent minA+1 from therapist for balance; Seated clams with manual resistance from therapist x 10; Seated adductor squeeze with manual resistance from therapist x 10; Seated march with manual resistance from therapist x 10; Squats with BUE support on bar, cues from therapist for weight on heels and to hinge at hips x 10; Standing heel/toe raises with BUE support on // bars x 10 each;    Neuromuscular Re-education CGA for stabilization with cues for safety awareness and body mechanics:  6" alternating Airex step taps x 10 BLE; Modified tandem balance with front foot on 6" step and rearfoot on Airex pad x 30s each; 1/2 foam roller A/P balance without UE support x 60s; 1/2 foam roller A/P heel/toe rocking x 60s;     Pt educated throughout session about proper posture and technique with exercises. Improved exercise technique, movement at target joints, use of target muscles after min to mod verbal, visual, tactile cues.    Pt demonstrates excellent motivation during session today. Outcome measures and goals not updated today because they were updated just recently. She completed her FOTO questionnaire during a previous session and it decreased from when it was last updated. She had been feeling more sore and fatigued over the last couple of weeks due side effect from her twice annual injections for osteoporosis, her COVID shot, and her NCV study. She completed the mODI and at that time scored 32% disabled related to her low back pain. Her worst pain has decreased from 10/10 at initial back assessment to 5/10. Over the last few weeks the focus of therapy has been on patients back/hip pain at her request and unfortunately her balance measures have returned to around pre-therapy numbers when they had previously made dramatic improvement. Moving forward one session will be focused on her  back and another session focused on her balance each week. She has not yet received maximal benefit from therapy and demonstrates the potential for continued improvement. Pt will benefit from PT services to address deficits in strength, balance, and mobility in order to return to full function at home.                          PT Short Term Goals - 06/19/20 1559      PT SHORT TERM GOAL #1   Title Patient will be independent in home exercise program to improve strength, mobility, and balance in order to improve function at home    Baseline 04/12/20: Patient is performing exercises at home.  Time 4    Period Weeks    Status Achieved    Target Date 03/15/20             PT Long Term Goals - 06/19/20 1559      PT LONG TERM GOAL #1   Title Pt will improve BERG by at least 3 points in order to demonstrate clinically significant improvement in balance.    Baseline 02/16/20: 51/56; 04/12/20: 53/56; 06/12/20: 51/56    Time 8    Period Weeks    Status On-going    Target Date 08/07/20      PT LONG TERM GOAL #2   Title Pt will improve ABC by at least 13% in order to demonstrate clinically significant improvement in balance confidence.    Baseline 02/16/20: 41.9%; 04/12/20: 68.75%; 06/13/20: 35%    Time 8    Period Weeks    Status On-going    Target Date 08/07/20      PT LONG TERM GOAL #3   Title Pt will decrease 5TSTS by at least 3 seconds in order to demonstrate clinically significant improvement in LE strength.    Baseline 02/16/20: 19.8s; 04/12/20: 11 secs; 06/07/20: 26.22sec (pain in bilat thighs, knee, shins, ankles); 06/12/20: 18.5s    Time 8    Period Weeks    Status Achieved      PT LONG TERM GOAL #4   Title Pt will improve FOTO to at least 64 in order to demonstrate clinically significant improvement in function at home.    Baseline 02/16/20: 55; 04/12/20: 59; 06/07/20: 46    Time 8    Period Weeks    Status On-going    Target Date 08/07/20      PT LONG  TERM GOAL #5   Title Pt will report less than 4/10 worst low back pain on NPRS in order to demonstrate clinically significant reduction in pain    Baseline 05/03/20: worst: 10/10; 06/12/20: 5/10;    Time 8    Period Weeks    Status Revised      PT LONG TERM GOAL #6   Title Pt will decrease mODI scoreby at least 13 points in order demonstrate clinically significant reduction in pain/disability    Baseline 06/12/20: 32%    Time 8    Period Weeks    Status New    Target Date 08/07/20                 Plan - 06/19/20 1555    Clinical Impression Statement Pt demonstrates excellent motivation during session today. Outcome measures and goals not updated today because they were updated just recently. She completed her FOTO questionnaire during a previous session and it decreased from when it was last updated. She had been feeling more sore and fatigued over the last couple of weeks due side effect from her twice annual injections for osteoporosis, her COVID shot, and her NCV study. She completed the mODI and at that time scored 32% disabled related to her low back pain. Her worst pain has decreased from 10/10 at initial back assessment to 5/10. Over the last few weeks the focus of therapy has been on patients back/hip pain at her request and unfortunately her balance measures have returned to around pre-therapy numbers when they had previously made dramatic improvement. Moving forward one session will be focused on her back and another session focused on her balance each week. She has not yet received maximal benefit from therapy and demonstrates the potential  for continued improvement. Pt will benefit from PT services to address deficits in strength, balance, and mobility in order to return to full function at home.    Personal Factors and Comorbidities Age;Time since onset of injury/illness/exacerbation;Comorbidity 3+    Comorbidities CA, osteoporosis, lumbar stenosis, hypothyroid     Examination-Activity Limitations Stairs;Carry;Locomotion Level;Squat;Transfers;Stand    Examination-Participation Restrictions Yard Work;Community Activity;Shop    Stability/Clinical Decision Making Evolving/Moderate complexity    Rehab Potential Fair    PT Frequency 2x / week    PT Duration 8 weeks    PT Treatment/Interventions Patient/family education;Therapeutic exercise;Balance training;Therapeutic activities;Gait training;ADLs/Self Care Home Management;Electrical Stimulation;Moist Heat;DME Instruction;Stair training;Functional mobility training;Taping;Passive range of motion;Dry needling;Joint Manipulations;Aquatic Therapy;Biofeedback;Canalith Repostioning;Cryotherapy;Iontophoresis 4mg /ml Dexamethasone;Neuromuscular re-education;Manual techniques;Vestibular    PT Next Visit Plan Progress note; Review back HEP, STM to low back and R hip, hip mobilizations, lumbar and abdominal strengthening    PT Home Exercise Plan Balance Medbridge: [email or web address]; Low back medbridge: Access Code: [email or web address]    Consulted and Agree with Plan of Care Patient           Patient will benefit from skilled therapeutic intervention in order to improve the following deficits and impairments:  Pain, Difficulty walking, Abnormal gait, Decreased balance, Decreased activity tolerance, Decreased strength  Visit Diagnosis: Pain in right hip  Chronic bilateral low back pain with right-sided sciatica  Unsteadiness on feet     Problem List There are no problems to display for this patient.  Phillips Grout PT, DPT, GCS  Susan Salas 06/20/2020, 3:46 PM  [place] MAIN [facility] SERVICES [address] [place], [place], [address] Phone: [phone]   Fax:  [phone]  Name: Susan Salas MRN: [id number] Date of Birth: 1936/02/26

## 2020-06-21 ENCOUNTER — Other Ambulatory Visit: Payer: Self-pay

## 2020-06-21 ENCOUNTER — Ambulatory Visit: Payer: Medicare Other

## 2020-06-26 ENCOUNTER — Ambulatory Visit: Payer: Medicare Other

## 2020-06-28 ENCOUNTER — Ambulatory Visit: Payer: Medicare Other

## 2020-07-04 ENCOUNTER — Ambulatory Visit: Payer: Medicare Other

## 2020-07-06 ENCOUNTER — Ambulatory Visit: Payer: Medicare Other

## 2020-07-12 ENCOUNTER — Other Ambulatory Visit: Payer: Self-pay | Admitting: Neurology

## 2020-07-12 DIAGNOSIS — M5416 Radiculopathy, lumbar region: Secondary | ICD-10-CM

## 2020-07-13 ENCOUNTER — Ambulatory Visit: Payer: Medicare Other

## 2020-07-18 ENCOUNTER — Ambulatory Visit: Payer: Medicare Other

## 2020-07-20 ENCOUNTER — Ambulatory Visit: Payer: Medicare Other

## 2020-07-25 ENCOUNTER — Ambulatory Visit: Payer: Medicare Other

## 2020-07-31 ENCOUNTER — Ambulatory Visit
Admission: RE | Admit: 2020-07-31 | Discharge: 2020-07-31 | Disposition: A | Discharge status: Home / Self Care | Payer: Medicare Other | Source: Ambulatory Visit | Attending: Neurology | Admitting: Neurology

## 2020-07-31 ENCOUNTER — Other Ambulatory Visit: Payer: Self-pay

## 2020-07-31 DIAGNOSIS — M5416 Radiculopathy, lumbar region: Secondary | ICD-10-CM

## 2020-07-31 MED ORDER — GADOBUTROL 1 MMOL/ML IV SOLN
8.0000 mL | Freq: Once | INTRAVENOUS | Status: AC | PRN
  Administered 2020-07-31: 8 mL via INTRAVENOUS

## 2020-08-01 ENCOUNTER — Ambulatory Visit: Payer: Medicare Other

## 2020-08-03 ENCOUNTER — Ambulatory Visit: Payer: Medicare Other

## 2020-09-05 ENCOUNTER — Ambulatory Visit: Payer: Medicare Other | Admitting: Physical Therapy

## 2020-09-07 ENCOUNTER — Ambulatory Visit: Payer: Medicare Other | Admitting: Physical Therapy

## 2020-09-12 ENCOUNTER — Ambulatory Visit: Payer: Medicare Other | Attending: Internal Medicine | Admitting: Physical Therapy

## 2020-09-12 ENCOUNTER — Encounter: Payer: Medicare Other | Admitting: Physical Therapy

## 2020-09-12 ENCOUNTER — Encounter: Payer: Self-pay | Admitting: Physical Therapy

## 2020-09-12 ENCOUNTER — Other Ambulatory Visit: Payer: Self-pay

## 2020-09-12 DIAGNOSIS — R2681 Unsteadiness on feet: Secondary | ICD-10-CM | POA: Diagnosis present

## 2020-09-12 DIAGNOSIS — R262 Difficulty in walking, not elsewhere classified: Secondary | ICD-10-CM | POA: Diagnosis present

## 2020-09-12 DIAGNOSIS — R2689 Other abnormalities of gait and mobility: Secondary | ICD-10-CM

## 2020-09-12 DIAGNOSIS — M6281 Muscle weakness (generalized): Secondary | ICD-10-CM | POA: Diagnosis present

## 2020-09-12 DIAGNOSIS — M5442 Lumbago with sciatica, left side: Secondary | ICD-10-CM | POA: Insufficient documentation

## 2020-09-12 DIAGNOSIS — G8929 Other chronic pain: Secondary | ICD-10-CM | POA: Diagnosis present

## 2020-09-12 DIAGNOSIS — M25562 Pain in left knee: Secondary | ICD-10-CM | POA: Diagnosis present

## 2020-09-12 DIAGNOSIS — M5441 Lumbago with sciatica, right side: Secondary | ICD-10-CM | POA: Diagnosis present

## 2020-09-12 DIAGNOSIS — M25561 Pain in right knee: Secondary | ICD-10-CM | POA: Diagnosis present

## 2020-09-12 NOTE — Therapy (Signed)
[facility] [address], [place], [address] Phone: [phone]   Fax:  [phone]  Physical Therapy Evaluation  Patient Details  Name: LEAMSI POHL MRN: [id number] Date of Birth: 04-29-1936 Referring Provider (PT): Jennings Books, ND (neurology)   Encounter Date: 09/12/2020   PT End of Session - 09/12/20 1958    Visit Number 1    Number of Visits 24    Date for PT Re-Evaluation 12/05/20    Authorization Type Medicare reporting period from 09/12/2020    Progress Note Due on Visit 10    PT Start Time 1800    PT Stop Time 1900    PT Time Calculation (min) 60 min    Equipment Utilized During Treatment Gait belt    Activity Tolerance Patient tolerated treatment well    Behavior During Therapy [facility] for tasks assessed/performed           Past Medical History:  Diagnosis Date  . Cancer ([place]) 2017   ovarian  . CHF (congestive heart failure) ([place])   . Complication of anesthesia    hard to wake from general anesthesia, long porcedures nausea and vomiting.  Marland Kitchen Hypertension   . Hypothyroidism   . PONV (postoperative nausea and vomiting)   . Spinal headache    with labor of 20 year old son.    Past Surgical History:  Procedure Laterality Date  . ABDOMINAL HYSTERECTOMY    . AMPUTATION TOE Left 05/02/2017   Procedure: AMPUTATION TOE/Left 4th mpj/28820;  Surgeon: Sharlotte Alamo, DPM;  Location: [facility] ORS;  Service: Podiatry;  Laterality: Left;  . APPENDECTOMY    . BACK SURGERY  2014   lamenectomy  . BREAST SURGERY Bilateral 2003   reduction  . CATARACT EXTRACTION W/ INTRAOCULAR LENS IMPLANT Bilateral 2014  . CHOLECYSTECTOMY    . COLON SURGERY  2017  . FRACTURE SURGERY Right 2003   screws & plates  . TONSILLECTOMY      There were no vitals filed for this visit.    Subjective Assessment - 09/12/20 1823    Subjective Patient is here with her husband, Fritz Pickerel, who occasionally contributes as appropriate.  Patient states her pain starts in her right lumbar region (later says left) and does down her right leg. When her husband massages it it feels like knotts in her leg. She states when he massages it he finds spots that are very painful and causes her to cry out. She states this is very painful, but it really helps her. She thought it was her IT band originally. She had PT with Corene Cornea at our [facility] but the last time she was there they did 2x15 sit<> stands and her knees hurt really bad afterwards and made it very difficult (documentation show she was last there 06/19/2020).  She also has started having pain in both thighs and pain down the sides of both legs down the the ankle, around the knees. Autumn the neurologist PA thinks this pain is related to her parkinsonianism and did not start until she started taking Letrozole for ovarian cancer. She has been taking this for 4 years and her pain has been there for 4 years, maybe longer. She has a history of back surgery (documentation reports in 2016) due to "having a bad back that runs in her family." She had a laminectomy (chart states in 2016). She had pain down both legs prior to the surgery, but it helped. She has also had a  surgery in her neck. She states has had Parkinson's that has just started getting bad in the last 2 years. Dr. Manuella Ghazi doesn't call it Parkinson's yet but it is a form of it. She has an essential tremor that causes difficulty holding things. She is also currently seen by [facility] every 3 months for monitoring due to ovarian cancer she states she was diagnosed with a little over 3 years ago. .She did chemo for 6 months and then put her on Letrozole for the duration of her life. She was given the option of dying or taking it when she asked if she could get off the medication. She currently goes to the cancer center at [facility] every 3 months. She has been doing this for about 3 years. Husband has lung and liver cancer as well. Patient will  be seeing an orthopedic this week for her knees. Patient originally reported right leg pain worse but then later states the left leg has actually been worse recently. Prior documentation from referring clinician notes mention both right and left low back effected.    Patient is accompained by: Family member    Pertinent History Patient is a 85 y.o. female who presents to outpatient physical therapy with a referral for medical diagnosis lumbar radiculopathy. This patient's chief complaints consist of bilateral low back and leg pain, knee pain, difficulty with mobility, and fear of falling, leading to the following functional deficits: "limits me from doing anything," cannot make the bed, difficulty walking, ADLs, IADLs, functional mobility. .  Relevant past medical history and comorbidities include ovarian cancer, basal cell cancer on back 05/2016, parkinson'onianism (dx in last year - states did not do well on parkinson's medication), restless leg syndrome, REM behavior disorder and insomnia, OSA, generalized sensory peripheral neuropathy,  HTN, hypothyroidism,  osteoporosis, congestive heart failure, essential tremor, back surgery (chart shows laminectomy, posterior lumbar facetectomy and foraminotmomy w/decomp n/a 04/19/2015), neck surgery (04/17/11 C3-7 DECOMP LAM AND LEFT SIDED PROXIMAL FORAMINOTOMIES), hx R ankle fracture surgery (pt reports instrumentation removed due to being allergic), exploratory laparotomy with removal of part of small intestine in 2017.  Patient denies hx of stroke, seizures, lung problem, major cardiac events, diabetes, unexplained weight loss, changes in bowel or bladder problems when pain started (does leak a lot - put bladder in sling). Endorses stumbling and dropping things.    Limitations Standing;Walking;Lifting;House hold activities    How long can you stand comfortably? 15-60 min before she has to sit down    How long can you walk comfortably? < 10 min before she must sit down     Diagnostic tests MRI report 07/31/20: "IMPRESSION:  1. Right paracentral and right foraminal disc protrusion at L3-4  contacting and likely irritating the right L3 nerve root.  2. Wide decompressive laminectomy at L2-3, L4-5 and L5-S1 but no  recurrent disc protrusion, spinal or foraminal stenosis at these  levels.  3. Mild right lateral recess and foraminal stenosis at L1-2."  [facility] report from 06/05/2020: "Impression:   This is an abnormal electrodiagnostic study consistent with a   generalized sensory polyneuropathy."    Patient Stated Goals "to be able to walk like I used to walk"    Currently in Pain? Yes    Pain Score 8    W: 10+/10; B: 0/10   Pain Location Back   B low back down both legs laterally to the ankles and over thighs and knees.   Pain Orientation Right;Left   R > L  Pain Descriptors / Indicators Aching;Throbbing;Restless   shooting   Pain Type Chronic pain    Pain Radiating Towards B posterior hip down lateral leg to ankles bilaterally, R > L (later said L > R recently)    Pain Onset More than a month ago    Pain Frequency Intermittent    Aggravating Factors  Extended standing, walking, sitting, activity    Pain Relieving Factors laying in bed, not moving, downward dog on bed, flexing forward.    Effect of Pain on Daily Activities Functional Limitations: "limits me from doing anything," cannot make the bed, difficulty walking, ADLs, IADLs, functional mobility.              [facility] PT Assessment - 09/12/20 1828      Assessment   Medical Diagnosis lumbar radiculopathy    Referring Provider (PT) Jennings Books, ND (neurology)    Onset Date/Surgical Date 09/12/16    Hand Dominance Right    Next MD Visit maybe sometime this month    Prior Therapy Patient seen earlier at main clinic, also back when she had surgery      Precautions   Precautions Fall      Restrictions   Weight Bearing Restrictions No      Balance Screen   Has the patient fallen in the past 6 months No     Has the patient had a decrease in activity level because of a fear of falling?  Yes    Is the patient reluctant to leave their home because of a fear of falling?  Yes      [place] Private residence    Living Arrangements Spouse/significant other    Available Help at Discharge Family    Type of [place] Access Level entry    [place] - single point;Shower seat - built in;Grab bars - tub/shower;Grab bars - toilet   raised toilet seats     Prior Function   Level of Independence Independent with basic ADLs;Independent with household mobility without device    Vocation Retired    [employer], plays cards twice/week, go out to eat, used to dance, traveling, reading      Cognition   Overall Cognitive Status Within Functional Limits for tasks assessed            OBJECTIVE  OBSERVATION/INSPECTION . Posture: forward head, rounded shoulders, slumped in sitting,  . Tremor: B hands, R > L . Muscle bulk: grossly symmetrical bilaterally . Bed mobility: supine <> sit <> prone and rolling mod I for increased time. . Transfers: sit <> stand mod I with rocking and multiple attempts using BUE support with SPC from 18.5 inch plinth. Painful.  . Gait: short careful steps with SPC and SBA from PT or holding onto the arm of her husband when ambulating outside. Very slow, stooped posture.  . Stairs: not tested   NEUROLOGICAL  Upper Motor Neuron Screen Clonus (ankle) negative bilaterally Dermatomes . L2-S2 appears equal and intact to light touch. Except L4-S1 diminished on left compared to right.  Myotomes . Weakness noted as below, not clearly myotomal Neurodynamic Tests   . L SLR positive for posterior thigh pain. R SLR negative.    SPINE MOTION Lumbar AROM *Indicates pain - Flexion (seated): = fingers to in front of ankles, good stretch - Extension: = neutral, needs  support not to fall,  good stretch - Rotation: R = 25% R low back pain, L = 25% - Side Flexion: R = 25% left pain, L = 25% (needs CGA-SBA with support at table for balance in all motions).    PERIPHERAL JOINT MOTION (in degrees) Passive Range of Motion (PROM) Comments: B LE WFL for basic mobility except B knee pain with knee motions and noted for L knee limited to about 90 degrees knee flexion with pain, pain at knee with L hip IR/ER.  MUSCLE PERFORMANCE (MMT):  *Indicates pain 09/12/20 Date Date  Joint/Motion R/L R/L R/L  Hip     Flexion 3-/3 / /  Extension (standing resting forearms on plinth) 3/3 / /  Abduction (seated) 4+/4+ / /  Adduction (seated) 4*/4* / /  Knee     Extension 2*/4* / /  Flexion 4*/3* / /  Ankle/Foot     Dorsiflexion  4/4 / /  Great toe extension 4-/4- / /  Eversion 2+/2+ / /  Plantarflexion 4-*/4* / /  Comments:  Pain at anterior knee with knee extension and flexion. Pain over anterior tibialis with bilateral PF. Pain at adductors with adduction.   SPECIAL TESTS: Straight leg raise (SLR): R = negative, L = positive for posterior thigh pain FABER: Unable to perform due to knee pain.   ACCESSORY MOTION:  - CPA tender at ~L1 and L3-5  PALPATION: - TTP at bilateral glute med L > R - TTP lower lumbar paraspinals bilaterally - TTP bilateral deep glute region, L > R  FUNCTIONAL/BALANCE TESTS: Timed Up and Go (TUG): 21 seconds (average of 2 trials) with B UE support to stand at armed chair and using SPC with close SBA.   EDUCATION/COGNITION: Patient is alert and oriented X 4 with some variations in her description of history or difficulty remembering details of her long history.   Objective measurements completed on examination: See above findings.      PT Education - 09/12/20 1957    Education Details activity purpose/form. Self management techniques. Education on diagnosis, prognosis, POC, anatomy and physiology of current condition    Person(s)  Educated Patient;Spouse    Methods Explanation;Demonstration;Tactile cues;Verbal cues    Comprehension Verbalized understanding;Returned demonstration;Verbal cues required;Tactile cues required;Need further instruction            PT Short Term Goals - 09/12/20 2001      PT SHORT TERM GOAL #1   Title Be independent with initial home exercise program for self-management of symptoms.    Baseline to be initiated at visit 2 as appropriate (09/12/2020);    Time 3    Period Weeks    Status New    Target Date 10/03/20             PT Long Term Goals - 09/12/20 2002      PT LONG TERM GOAL #1   Title Be independent with a long-term home exercise program for self-management of symptoms.    Baseline initial HEP to be provided at visit 2 as appropriate (09/12/2020);    Time 12    Period Weeks    Status New   TARGET DATE FOR ALL LONG TERM GOALS: 12/05/2020     PT LONG TERM GOAL #2   Title Pt will improve ABC by at least 13% in order to demonstrate clinically significant improvement in balance confidence.    Baseline to be measures at visit 2 (09/12/2020);    Time 12    Period Weeks    Status  New      PT LONG TERM GOAL #3   Title Demonstrate improved FOTO score by 10 units to demonstrate improvement in overall condition and self-reported functional ability.    Baseline to be assessed at visit 2 as appropriate (09/12/2020);    Time 12    Period Weeks    Status New      PT LONG TERM GOAL #4   Title Patient will improve time on Timed Get up and Go to equal or less than 15 seconds to demonstrate decreased fall risk and improved functional moblity.    Baseline 21 seconds with BUE support off chair and SPC (09/12/2020);    Time 12    Period Weeks    Status New      PT LONG TERM GOAL #5   Title Pt will report less than 4/10 worst low back pain on NPRS in order to demonstrate clinically significant reduction in pain    Baseline 10+/10 worst (09/12/2020);    Time 12    Period Weeks    Status  New                  Plan - 09/12/20 2017    Clinical Impression Statement Patient is a 85 y.o. female referred to outpatient physical therapy with a medical diagnosis of lumbar radiculopathy who presents with signs and symptoms consistent with low back pain with radiation to bilateral LEs currently L > R, bilateral knee pain, imbalance, and high fall risk, with generalized deconditioning in setting of multiple comorbid conditions including cancer, parkinsonism, and peripheral neuropathy. Patient presents with significant pain, balance, ROM, neural tension, activity tolerance, fall risk, muscle performance (strength/power/endurance) impairments that are limiting ability to complete her usual activities including basic transfers, bed mobility, household and community ambulation, standing, sitting, housework, etc. without difficulty and places her at a high risk for falls with injury. Patient demonstrates findings consistent with low back pain with current neural tension and radiation down the left leg (patient reports it has also been more prominent on R leg), severe B knee pain consistent with knee joints as source of pain, imbalance that appears related to peripheral neuropathy, parkinsonism, gait disturbance. She required constant guarding to min A for ambulation with SPC and was able to complete Timed Up and Go in 21 seconds, suggesting high fall risk. Patient also reports high levels of pain and was limited in mobility by pain, especially at the knees. She appears to be deconditioned and does not tolerate much activity due to pain and fatigue. She is quite limited in function due to multiple contributing factors as described above. Patient will benefit from skilled physical therapy intervention to address current body structure impairments and activity limitations to improve function and work towards goals set in current POC in order to return to prior level of function or maximal functional  improvement.    Personal Factors and Comorbidities Age;Time since onset of injury/illness/exacerbation;Comorbidity 3+;Fitness;Past/Current Experience    Comorbidities Relevant past medical history and comorbidities include ovarian cancer, basal cell cancer on back 05/2016, parkinson'onianism (dx in last year - states did not do well on parkinson's medication), restless leg syndrome, REM behavior disorder and insomnia, OSA, generalized sensory peripheral neuropathy,  HTN, hypothyroidism,  osteoporosis, congestive heart failure, essential tremor, back surgery (chart shows laminectomy, posterior lumbar facetectomy and foraminotmomy w/decomp n/a 04/19/2015), neck surgery (04/17/11 C3-7 DECOMP LAM AND LEFT SIDED PROXIMAL FORAMINOTOMIES), hx R ankle fracture surgery (pt reports instrumentation removed due to being allergic), exploratory  laparotomy with removal of part of small intestine in 2017.    Examination-Activity Limitations Stairs;Carry;Locomotion Level;Squat;Transfers;Stand;Bed Mobility;Caring for Others;Bend;Lift    Examination-Participation Restrictions Yard Work;Community Activity;Shop;Driving;Cleaning;Laundry;Meal Prep;Interpersonal Relationship    Stability/Clinical Decision Making Evolving/Moderate complexity    Clinical Decision Making Moderate    Rehab Potential Fair    PT Frequency 2x / week    PT Duration 12 weeks    PT Treatment/Interventions Patient/family education;Therapeutic exercise;Balance training;Therapeutic activities;Gait training;ADLs/Self Care Home Management;Moist Heat;DME Instruction;Stair training;Functional mobility training;Passive range of motion;Dry needling;Joint Manipulations;Aquatic Therapy;Biofeedback;Canalith Repostioning;Cryotherapy;Neuromuscular re-education;Manual techniques;Vestibular;Electrical Stimulation;Spinal Manipulations    PT Next Visit Plan establish HEP for this episode, FOTO    PT Home Exercise Plan TBD    Consulted and Agree with Plan of Care  Patient;Family member/caregiver    Family Member Consulted Husband, Fritz Pickerel           Patient will benefit from skilled therapeutic intervention in order to improve the following deficits and impairments:  Pain,Difficulty walking,Abnormal gait,Decreased balance,Decreased activity tolerance,Decreased strength,Decreased knowledge of use of DME,Impaired sensation,Decreased coordination,Decreased mobility,Decreased endurance,Decreased range of motion,Hypomobility,Impaired perceived functional ability,Obesity,Impaired flexibility  Visit Diagnosis: Chronic bilateral low back pain with bilateral sciatica  Muscle weakness (generalized)  Unsteadiness on feet  Difficulty in walking, not elsewhere classified  Other abnormalities of gait and mobility  Right knee pain, unspecified chronicity  Left knee pain, unspecified chronicity     Problem List There are no problems to display for this patient.   Everlean Alstrom. Graylon Good, PT, DPT 09/12/20, 8:21 PM  [facility] [address] [place], [address] Phone: [phone]   Fax:  [phone]  Name: ALEJA YEARWOOD MRN: [id number] Date of Birth: 1936-06-23

## 2020-09-14 ENCOUNTER — Ambulatory Visit: Payer: Medicare Other | Admitting: Physical Therapy

## 2020-09-18 ENCOUNTER — Ambulatory Visit: Payer: Medicare Other | Admitting: Physical Therapy

## 2020-09-19 ENCOUNTER — Encounter: Payer: Medicare Other | Admitting: Physical Therapy

## 2020-09-20 ENCOUNTER — Ambulatory Visit: Payer: Medicare Other | Admitting: Physical Therapy

## 2020-09-21 ENCOUNTER — Encounter: Payer: Medicare Other | Admitting: Physical Therapy

## 2020-09-25 ENCOUNTER — Emergency Department: Payer: Medicare Other

## 2020-09-25 ENCOUNTER — Other Ambulatory Visit: Payer: Self-pay

## 2020-09-25 ENCOUNTER — Ambulatory Visit: Payer: Medicare Other | Admitting: Physical Therapy

## 2020-09-25 ENCOUNTER — Emergency Department
Admission: EM | Admit: 2020-09-25 | Discharge: 2020-09-25 | Disposition: A | Discharge status: Home / Self Care | Payer: Medicare Other | Attending: Emergency Medicine | Admitting: Emergency Medicine

## 2020-09-25 ENCOUNTER — Encounter: Payer: Self-pay | Admitting: Emergency Medicine

## 2020-09-25 DIAGNOSIS — I509 Heart failure, unspecified: Secondary | ICD-10-CM | POA: Diagnosis not present

## 2020-09-25 DIAGNOSIS — S20211A Contusion of right front wall of thorax, initial encounter: Secondary | ICD-10-CM

## 2020-09-25 DIAGNOSIS — Z85830 Personal history of malignant neoplasm of bone: Secondary | ICD-10-CM | POA: Insufficient documentation

## 2020-09-25 DIAGNOSIS — Z87891 Personal history of nicotine dependence: Secondary | ICD-10-CM | POA: Insufficient documentation

## 2020-09-25 DIAGNOSIS — W1839XA Other fall on same level, initial encounter: Secondary | ICD-10-CM | POA: Diagnosis not present

## 2020-09-25 DIAGNOSIS — Z79899 Other long term (current) drug therapy: Secondary | ICD-10-CM | POA: Insufficient documentation

## 2020-09-25 DIAGNOSIS — Z7982 Long term (current) use of aspirin: Secondary | ICD-10-CM | POA: Insufficient documentation

## 2020-09-25 DIAGNOSIS — E039 Hypothyroidism, unspecified: Secondary | ICD-10-CM | POA: Insufficient documentation

## 2020-09-25 DIAGNOSIS — I6782 Cerebral ischemia: Secondary | ICD-10-CM | POA: Insufficient documentation

## 2020-09-25 DIAGNOSIS — T1490XA Injury, unspecified, initial encounter: Secondary | ICD-10-CM

## 2020-09-25 DIAGNOSIS — Z8543 Personal history of malignant neoplasm of ovary: Secondary | ICD-10-CM | POA: Insufficient documentation

## 2020-09-25 DIAGNOSIS — W19XXXA Unspecified fall, initial encounter: Secondary | ICD-10-CM

## 2020-09-25 DIAGNOSIS — I110 Hypertensive heart disease with heart failure: Secondary | ICD-10-CM | POA: Insufficient documentation

## 2020-09-25 DIAGNOSIS — M25551 Pain in right hip: Secondary | ICD-10-CM | POA: Insufficient documentation

## 2020-09-25 DIAGNOSIS — S299XXA Unspecified injury of thorax, initial encounter: Secondary | ICD-10-CM | POA: Diagnosis present

## 2020-09-25 LAB — APTT: aPTT: 27 seconds (ref 24–36)

## 2020-09-25 LAB — DIFFERENTIAL
Abs Immature Granulocytes: 0.03 10*3/uL (ref 0.00–0.07)
Basophils Absolute: 0.1 10*3/uL (ref 0.0–0.1)
Basophils Relative: 1 %
Eosinophils Absolute: 0.2 10*3/uL (ref 0.0–0.5)
Eosinophils Relative: 2 %
Immature Granulocytes: 0 %
Lymphocytes Relative: 16 %
Lymphs Abs: 1.5 10*3/uL (ref 0.7–4.0)
Monocytes Absolute: 0.7 10*3/uL (ref 0.1–1.0)
Monocytes Relative: 8 %
Neutro Abs: 6.9 10*3/uL (ref 1.7–7.7)
Neutrophils Relative %: 73 %

## 2020-09-25 LAB — COMPREHENSIVE METABOLIC PANEL
ALT: 20 U/L (ref 0–44)
AST: 20 U/L (ref 15–41)
Albumin: 4.7 g/dL (ref 3.5–5.0)
Alkaline Phosphatase: 44 U/L (ref 38–126)
Anion gap: 12 (ref 5–15)
BUN: 19 mg/dL (ref 8–23)
CO2: 28 mmol/L (ref 22–32)
Calcium: 9.7 mg/dL (ref 8.9–10.3)
Chloride: 99 mmol/L (ref 98–111)
Creatinine, Ser: 0.66 mg/dL (ref 0.44–1.00)
GFR, Estimated: >60 mL/min (ref >60)
Glucose, Bld: 124 mg/dL — ABNORMAL HIGH (ref 70–99)
Potassium: 4.2 mmol/L (ref 3.5–5.1)
Sodium: 139 mmol/L (ref 135–145)
Total Bilirubin: 1.7 mg/dL — ABNORMAL HIGH (ref 0.3–1.2)
Total Protein: 8.1 g/dL (ref 6.5–8.1)

## 2020-09-25 LAB — PROTIME-INR
INR: 1 (ref 0.8–1.2)
Prothrombin Time: 12.6 seconds (ref 11.4–15.2)

## 2020-09-25 LAB — CBC
HCT: 46.4 % — ABNORMAL HIGH (ref 36.0–46.0)
Hemoglobin: 14.9 g/dL (ref 12.0–15.0)
MCH: 30.5 pg (ref 26.0–34.0)
MCHC: 32.1 g/dL (ref 30.0–36.0)
MCV: 94.9 fL (ref 80.0–100.0)
Platelets: 236 10*3/uL (ref 150–400)
RBC: 4.89 MIL/uL (ref 3.87–5.11)
RDW: 14.1 % (ref 11.5–15.5)
WBC: 9.3 10*3/uL (ref 4.0–10.5)
nRBC: 0 % (ref 0.0–0.2)

## 2020-09-25 MED ORDER — FENTANYL CITRATE (PF) 100 MCG/2ML IJ SOLN
50.0000 ug | Freq: Once | INTRAMUSCULAR | Status: AC
  Administered 2020-09-25: 50 ug via INTRAVENOUS
  Filled 2020-09-25: qty 2

## 2020-09-25 MED ORDER — LIDOCAINE 5 % EX PTCH
1.0000 | MEDICATED_PATCH | CUTANEOUS | Status: DC
  Administered 2020-09-25: 1 via TRANSDERMAL
  Filled 2020-09-25: qty 1

## 2020-09-25 MED ORDER — HYDROCODONE-ACETAMINOPHEN 5-325 MG PO TABS: 1.0000 | ORAL_TABLET | ORAL | 0 refills | Status: DC | PRN

## 2020-09-25 MED ORDER — LIDOCAINE 5 % EX PTCH: 1.0000 | MEDICATED_PATCH | Freq: Two times a day (BID) | CUTANEOUS | 0 refills | Status: DC

## 2020-09-25 MED ORDER — IOHEXOL 300 MG/ML  SOLN
100.0000 mL | Freq: Once | INTRAMUSCULAR | Status: AC | PRN
  Administered 2020-09-25: 100 mL via INTRAVENOUS
  Filled 2020-09-25: qty 100

## 2020-09-25 NOTE — ED Notes (Signed)
Pt states coming in after a fall several days ago at 4am when she was getting up to go to the bathroom. Pt states she is a cancer survivor. This RN attempted to get a 22G to the right arm, RN unable to get IV access. Catheter intact, bleeding controlled. CT tech at bedside to attempt IV access.

## 2020-09-25 NOTE — ED Notes (Signed)
Pt up and walking with cane and husband. Pt steady on feet. Pt denies pain

## 2020-09-25 NOTE — ED Notes (Signed)
Fell Thursday night when she got up quickly to answer phone.  Says her right foot did not turn and she fell and hit right side on end table.  She has large bruised area over right side area.  Says her legs hurt down to the thighs too.  Good circulation to extremities.

## 2020-09-25 NOTE — ED Notes (Signed)
IV start attempted no success

## 2020-09-25 NOTE — ED Provider Notes (Signed)
[facility] Emergency Department Provider Note   ____________________________________________   Event Date/Time   First MD Initiated Contact with Patient 09/25/20 1459     (approximate)  I have reviewed the triage vital signs and the nursing notes.   HISTORY  Chief Complaint Fall and Extremity Weakness    HPI Susan Salas is a 85 y.o. female with past medical history of hypertension, hyperlipidemia, CHF, and ovarian cancer metastatic to bone who presents to the ED complaining of fall.  Patient reports that she got up to go to the bathroom early this morning and when she went to turn in place, her right leg did not respond like it usually does.  This caused her to fall onto her right side, she denies hitting her head or losing consciousness.  She does not take any blood thinners.  She now primarily complains of pain along her right chest wall and right hip.  She deals with chronic pain and weakness in both of her lower extremities, states any weakness in her right lower extremity has now resolved.  She denies any other areas of numbness or weakness.  She has significant sharp pain on the right side of her chest with a deep breath.        Past Medical History:  Diagnosis Date  . Cancer ([place]) 2017   ovarian  . CHF (congestive heart failure) ([place])   . Complication of anesthesia    hard to wake from general anesthesia, long porcedures nausea and vomiting.  Marland Kitchen Hypertension   . Hypothyroidism   . PONV (postoperative nausea and vomiting)   . Spinal headache    with labor of 59 year old son.    There are no problems to display for this patient.   Past Surgical History:  Procedure Laterality Date  . ABDOMINAL HYSTERECTOMY    . AMPUTATION TOE Left 05/02/2017   Procedure: AMPUTATION TOE/Left 4th mpj/28820;  Surgeon: Sharlotte Alamo, DPM;  Location: [facility] ORS;  Service: Podiatry;  Laterality: Left;  . APPENDECTOMY    . BACK SURGERY  2014   lamenectomy  .  BREAST SURGERY Bilateral 2003   reduction  . CATARACT EXTRACTION W/ INTRAOCULAR LENS IMPLANT Bilateral 2014  . CHOLECYSTECTOMY    . COLON SURGERY  2017  . FRACTURE SURGERY Right 2003   screws & plates  . TONSILLECTOMY      Prior to Admission medications   Medication Sig Start Date End Date Taking? Authorizing Provider  aspirin EC 81 MG tablet Take 81 mg by mouth at bedtime.   Yes [provider]  COREG CR 10 MG 24 hr capsule Take 10 mg by mouth at bedtime. 02/23/17  Yes [provider]  diclofenac sodium (VOLTAREN) 1 % GEL Apply 2-4 g topically daily as needed. For shoulder pain. 03/17/17  Yes [provider]  DULoxetine (CYMBALTA) 60 MG capsule Take 60 mg by mouth daily.   Yes [provider]  furosemide (LASIX) 40 MG tablet Take 40 mg by mouth daily.   Yes [provider]  HYDROcodone-acetaminophen (NORCO/VICODIN) 5-325 MG tablet Take 1 tablet by mouth every 4 (four) hours as needed for moderate pain. 09/25/20 09/25/21 Yes Blake Divine, MD  letrozole [facility]) 2.5 MG tablet Take 2.5 mg by mouth daily with breakfast. 03/25/17  Yes [provider]  lidocaine (LIDODERM) 5 % Place 1 patch onto the skin every 12 (twelve) hours. Remove & Discard patch within 12 hours or as directed by MD 09/25/20 09/25/21 Yes  Blake Divine, MD  losartan (COZAAR) 50 MG tablet Take 50 mg by mouth daily.   Yes [provider]  Multiple Vitamin (MULTIVITAMIN WITH MINERALS) TABS tablet Take 1 tablet by mouth daily. ONE-A-DAY   Yes [provider]  Probiotic Product (PROBIOTIC PO) Take 1 capsule by mouth daily.   Yes [provider]  SYNTHROID 88 MCG tablet Take 88 mcg by mouth daily before breakfast. 01/28/17  Yes [provider]  VITAMIN E PO Take by mouth.   Yes [provider]  acetaminophen (TYLENOL) 500 MG tablet Take 500-1,000 mg by mouth every 6 (six) hours as needed (for pain.).    [provider]   zolpidem (AMBIEN) 5 MG tablet Take 2.5 mg by mouth at bedtime as needed for sleep.    [provider]    Allergies Other, Sulfa antibiotics, Ibandronic acid, Statins, Hydrochlorothiazide, Morphine, Ramipril, and Tape  No family history on file.  Social History Social History   Tobacco Use  . Smoking status: Former Smoker    Quit date: 04/29/1983    Years since quitting: 37.4  . Smokeless tobacco: Never Used  Vaping Use  . Vaping Use: Never used  Substance Use Topics  . Alcohol use: No  . Drug use: No    Review of Systems  Constitutional: No fever/chills Eyes: No visual changes. ENT: No sore throat. Cardiovascular: Positive for chest wall pain. Respiratory: Denies shortness of breath. Gastrointestinal: No abdominal pain.  No nausea, no vomiting.  No diarrhea.  No constipation. Genitourinary: Negative for dysuria. Musculoskeletal: Negative for back pain.  Positive for right hip pain. Skin: Negative for rash. Neurological: Negative for headaches, focal weakness or numbness.  ____________________________________________   PHYSICAL EXAM:  VITAL SIGNS: ED Triage Vitals  Enc Vitals Group     BP 09/25/20 1244 (!) 155/92     Pulse Rate 09/25/20 1244 71     Resp 09/25/20 1244 18     Temp 09/25/20 1244 97.7 F (36.5 C)     Temp Source 09/25/20 1244 Oral     SpO2 09/25/20 1244 98 %     Weight 09/25/20 1236 199 lb 11.2 oz (90.6 kg)     Height 09/25/20 1236 5\' 4"  (1.626 m)     Head Circumference --      Peak Flow --      Pain Score 09/25/20 1236 6     Pain Loc --      Pain Edu? --      Excl. in [place]? --     Constitutional: Alert and oriented. Eyes: Conjunctivae are normal. Head: Atraumatic. Nose: No congestion/rhinnorhea. Mouth/Throat: Mucous membranes are moist. Neck: Normal ROM Cardiovascular: Normal rate, regular rhythm. Grossly normal heart sounds.  2+ DP pulses bilaterally. Respiratory: Normal respiratory effort.  No retractions. Lungs CTAB.  Right  chest wall tenderness to palpation noted. Gastrointestinal: Soft and nontender. No distention. Genitourinary: deferred Musculoskeletal: Diffuse tenderness of right hip with overlying ecchymosis, otherwise no bony tenderness to left hip, bilateral knees, or bilateral ankles.  No obvious deformity noted.  No upper extremity bony tenderness to palpation. Neurologic:  Normal speech and language. No gross focal neurologic deficits are appreciated. Skin:  Skin is warm, dry and intact. No rash noted. Psychiatric: Mood and affect are normal. Speech and behavior are normal.  ____________________________________________   LABS (all labs ordered are listed, but only abnormal results are displayed)  Labs Reviewed  CBC - Abnormal; Notable for the following components:  Result Value   HCT 46.4 (*)    All other components within normal limits  COMPREHENSIVE METABOLIC PANEL - Abnormal; Notable for the following components:   Glucose, Bld 124 (*)    Total Bilirubin 1.7 (*)    All other components within normal limits  PROTIME-INR  APTT  DIFFERENTIAL   ____________________________________________  EKG  ED ECG REPORT I, Blake Divine, the attending physician, personally viewed and interpreted this ECG.   Date: 09/25/2020  EKG Time: 12:38  Rate: 75  Rhythm: normal sinus rhythm  Axis: Normal  Intervals:none  ST&T Change: None   PROCEDURES  Procedure(s) performed (including Critical Care):  Procedures   ____________________________________________   INITIAL IMPRESSION / ASSESSMENT AND PLAN / ED COURSE       85 year old female with past medical history of hypertension, CHF, and metastatic ovarian cancer who presents to the ED following fall onto her right side earlier this morning.  CT head performed from triage and is negative for acute process, reviewed by me with no obvious hemorrhage.  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  Labs thus far are unremarkable.  Patient does have significant tenderness along her right chest wall and right hip, x-rays of right hip negative for acute process.  We will further assess with CT C-spine, chest, and abdomen/pelvis.  We will treat patient's pain with fentanyl as she is allergic to morphine but has tolerated fentanyl previously.  I doubt acute stroke given no focal deficits on her neurologic exam and intermittent difficulties with her right lower extremity are not unusual for her.  CT cervical spine is negative for acute process, imaging of chest/abdomen/pelvis also shows no acute traumatic injury.  Patient reports pain is much improved following fentanyl and Lidoderm patch, she is now ambulating without difficulty.  She is appropriate for discharge home with PCP follow-up and given her chest wall contusion, will be provided with pain medication as well as an incentive spirometer.  She was counseled to return to the ED for new worsening symptoms, otherwise follow-up with her PCP.  Patient agrees with plan.      ____________________________________________   FINAL CLINICAL IMPRESSION(S) / ED DIAGNOSES  Final diagnoses:  Fall, initial encounter  Contusion of right chest wall, initial encounter     ED Discharge Orders         Ordered    lidocaine (LIDODERM) 5 %  Every 12 hours        09/25/20 1712    HYDROcodone-acetaminophen (NORCO/VICODIN) 5-325 MG tablet  Every 4 hours PRN        09/25/20 1712           Note:  This document was prepared using Dragon voice recognition software and may include unintentional dictation errors.   Blake Divine, MD 09/25/20 1714

## 2020-09-25 NOTE — ED Triage Notes (Addendum)
Patient to ER for c/o fall and right foot weakness. Patient states she was trying to get back into bed, but right foot wouldn't turn. Reports foot weakness was present upon weakness. States she then fell and hit bedside table. Right hip and right abdomen pain from fall. Grips equal bilaterally.

## 2020-09-26 ENCOUNTER — Encounter: Payer: Medicare Other | Admitting: Physical Therapy

## 2020-09-27 ENCOUNTER — Encounter: Payer: Medicare Other | Admitting: Physical Therapy

## 2020-09-28 ENCOUNTER — Encounter: Payer: Medicare Other | Admitting: Physical Therapy

## 2020-10-04 ENCOUNTER — Ambulatory Visit: Payer: Medicare Other | Admitting: Physical Therapy

## 2020-10-09 ENCOUNTER — Telehealth: Payer: Self-pay | Admitting: Physical Therapy

## 2020-10-09 NOTE — Telephone Encounter (Signed)
Called to check on patient and confirm her appointment tomorrow at 3:15pm. No answer. Left VM letting her know if the appointment and asking her to call back if she needed to change it or had any other questions or concerns.   Susan Salas. Graylon Good, PT, DPT 10/09/20, 9:31 AM

## 2020-10-10 ENCOUNTER — Encounter: Payer: Self-pay | Admitting: Physical Therapy

## 2020-10-10 ENCOUNTER — Ambulatory Visit: Payer: Medicare Other | Attending: Internal Medicine | Admitting: Physical Therapy

## 2020-10-10 ENCOUNTER — Other Ambulatory Visit: Payer: Self-pay

## 2020-10-10 DIAGNOSIS — M5441 Lumbago with sciatica, right side: Secondary | ICD-10-CM | POA: Diagnosis present

## 2020-10-10 DIAGNOSIS — R2689 Other abnormalities of gait and mobility: Secondary | ICD-10-CM

## 2020-10-10 DIAGNOSIS — G8929 Other chronic pain: Secondary | ICD-10-CM | POA: Diagnosis present

## 2020-10-10 DIAGNOSIS — R262 Difficulty in walking, not elsewhere classified: Secondary | ICD-10-CM | POA: Diagnosis present

## 2020-10-10 DIAGNOSIS — M25561 Pain in right knee: Secondary | ICD-10-CM

## 2020-10-10 DIAGNOSIS — M5442 Lumbago with sciatica, left side: Secondary | ICD-10-CM | POA: Insufficient documentation

## 2020-10-10 DIAGNOSIS — M6281 Muscle weakness (generalized): Secondary | ICD-10-CM | POA: Diagnosis present

## 2020-10-10 DIAGNOSIS — R2681 Unsteadiness on feet: Secondary | ICD-10-CM | POA: Insufficient documentation

## 2020-10-10 DIAGNOSIS — M25562 Pain in left knee: Secondary | ICD-10-CM | POA: Insufficient documentation

## 2020-10-10 NOTE — Therapy (Signed)
[facility] [address] [place], [address] Phone: [phone]   Fax:  [phone]  Physical Therapy Treatment  Patient Details  Name: Susan Salas MRN: [id number] Date of Birth: April 14, 1936 Referring Provider (PT): Jennings Books, ND (neurology)   Encounter Date: 10/10/2020   PT End of Session - 10/10/20 1609    Visit Number 2    Number of Visits 24    Date for PT Re-Evaluation 12/05/20    Authorization Type Medicare reporting period from 09/12/2020    Progress Note Due on Visit 10    PT Start Time 1513    PT Stop Time 1558    PT Time Calculation (min) 45 min    Equipment Utilized During Treatment Gait belt    Activity Tolerance Patient tolerated treatment well;Patient limited by pain;Patient limited by fatigue    Behavior During Therapy [facility] for tasks assessed/performed           Past Medical History:  Diagnosis Date  . Cancer ([place]) 2017   ovarian  . CHF (congestive heart failure) ([place])   . Complication of anesthesia    hard to wake from general anesthesia, long porcedures nausea and vomiting.  Marland Kitchen Hypertension   . Hypothyroidism   . PONV (postoperative nausea and vomiting)   . Spinal headache    with labor of 29 year old son.    Past Surgical History:  Procedure Laterality Date  . ABDOMINAL HYSTERECTOMY    . AMPUTATION TOE Left 05/02/2017   Procedure: AMPUTATION TOE/Left 4th mpj/28820;  Surgeon: Sharlotte Alamo, DPM;  Location: [facility] ORS;  Service: Podiatry;  Laterality: Left;  . APPENDECTOMY    . BACK SURGERY  2014   lamenectomy  . BREAST SURGERY Bilateral 2003   reduction  . CATARACT EXTRACTION W/ INTRAOCULAR LENS IMPLANT Bilateral 2014  . CHOLECYSTECTOMY    . COLON SURGERY  2017  . FRACTURE SURGERY Right 2003   screws & plates  . TONSILLECTOMY      There were no vitals filed for this visit.   Subjective Assessment - 10/10/20 1515    Subjective Patient reports she fell about 2 weeks  ago. She has been seen at the emergency room and with her PCP. No fractures and was advised to get a walker with physical therapist recocmending the type. She has a script for this. Reports current pain 5/10 in the left leg (which is the lowest it gets). Unsure what happened when she fell. States the R leg stayed stationariy when she tried to turn after standing up from bathroom after using the bathroom in the night. Arrived with [facility] and her husband's hand held support today.    Patient is accompained by: Family member    Pertinent History Patient is a 85 y.o. female who presents to outpatient physical therapy with a referral for medical diagnosis lumbar radiculopathy. This patient's chief complaints consist of bilateral low back and leg pain, knee pain, difficulty with mobility, and fear of falling, leading to the following functional deficits: "limits me from doing anything," cannot make the bed, difficulty walking, ADLs, IADLs, functional mobility. .  Relevant past medical history and comorbidities include ovarian cancer, basal cell cancer on back 05/2016, parkinson'onianism (dx in last year - states did not do well on parkinson's medication), restless leg syndrome, REM behavior disorder and insomnia, OSA, generalized sensory peripheral neuropathy,  HTN, hypothyroidism,  osteoporosis, congestive heart failure, essential tremor, back surgery (chart shows laminectomy, posterior lumbar facetectomy  and foraminotmomy w/decomp n/a 04/19/2015), neck surgery (04/17/11 C3-7 DECOMP LAM AND LEFT SIDED PROXIMAL FORAMINOTOMIES), hx R ankle fracture surgery (pt reports instrumentation removed due to being allergic), exploratory laparotomy with removal of part of small intestine in 2017.  Patient denies hx of stroke, seizures, lung problem, major cardiac events, diabetes, unexplained weight loss, changes in bowel or bladder problems when pain started (does leak a lot - put bladder in sling). Endorses stumbling and dropping  things.    Limitations Standing;Walking;Lifting;House hold activities    How long can you stand comfortably? 15-60 min before she has to sit down    How long can you walk comfortably? < 10 min before she must sit down    Diagnostic tests MRI report 07/31/20: "IMPRESSION:  1. Right paracentral and right foraminal disc protrusion at L3-4  contacting and likely irritating the right L3 nerve root.  2. Wide decompressive laminectomy at L2-3, L4-5 and L5-S1 but no  recurrent disc protrusion, spinal or foraminal stenosis at these  levels.  3. Mild right lateral recess and foraminal stenosis at L1-2."  NCS report from 06/05/2020: "Impression:   This is an abnormal electrodiagnostic study consistent with a   generalized sensory polyneuropathy."    Patient Stated Goals "to be able to walk like I used to walk"    Currently in Pain? Yes    Pain Score 5     Pain Onset More than a month ago             TREATMENT:  Therapeutic exercise: to centralize symptoms and improve ROM, strength, muscular endurance, and activity tolerance required for successful completion of functional activities.  - NuStep level 0 using bilateral upper and lower extremities. Seat/handle setting 7/9. For improved extremity mobility, muscular endurance, and activity tolerance; and to induce the analgesic effect of aerobic exercise, stimulate improved joint nutrition, and prepare body structures and systems for following interventions. x 10  Minutes while PT assisted patient with FOTO questionnarie. Average SPM = 42.  - ambulation 3x25-50 feet with SPC and CGA for safety.  - education and demonstration of rollator vs RW and how to use safely.  - ambulation with rollator x ~ 400 feet on flat surface, up and down the ramp, up/down curb, slightly uneven ground in parking lot with SBA and cuing.  - practice locking and sit <> stand transfer in rollator.  - education and discussion of how to obtain rollator with patient and husband.     PT  Education - 10/10/20 1938    Education Details safe use of rollator walker, advantages/disadvantages in rollator vs RW, assistance with how to use physician's order to get rollator    Person(s) Educated Patient;Spouse    Methods Explanation;Demonstration;Handout;Tactile cues;Verbal cues    Comprehension Verbalized understanding;Verbal cues required;Returned demonstration;Tactile cues required;Need further instruction            PT Short Term Goals - 09/12/20 2001      PT SHORT TERM GOAL #1   Title Be independent with initial home exercise program for self-management of symptoms.    Baseline to be initiated at visit 2 as appropriate (09/12/2020);    Time 3    Period Weeks    Status New    Target Date 10/03/20             PT Long Term Goals - 09/12/20 2002      PT LONG TERM GOAL #1   Title Be independent with a long-term home exercise program for  self-management of symptoms.    Baseline initial HEP to be provided at visit 2 as appropriate (09/12/2020);    Time 12    Period Weeks    Status New   TARGET DATE FOR ALL LONG TERM GOALS: 12/05/2020     PT LONG TERM GOAL #2   Title Pt will improve ABC by at least 13% in order to demonstrate clinically significant improvement in balance confidence.    Baseline to be measures at visit 2 (09/12/2020);    Time 12    Period Weeks    Status New      PT LONG TERM GOAL #3   Title Demonstrate improved FOTO score by 10 units to demonstrate improvement in overall condition and self-reported functional ability.    Baseline to be assessed at visit 2 as appropriate (09/12/2020);    Time 12    Period Weeks    Status New      PT LONG TERM GOAL #4   Title Patient will improve time on Timed Get up and Go to equal or less than 15 seconds to demonstrate decreased fall risk and improved functional moblity.    Baseline 21 seconds with BUE support off chair and SPC (09/12/2020);    Time 12    Period Weeks    Status New      PT LONG TERM GOAL #5    Title Pt will report less than 4/10 worst low back pain on NPRS in order to demonstrate clinically significant reduction in pain    Baseline 10+/10 worst (09/12/2020);    Time 12    Period Weeks    Status New                 Plan - 10/10/20 1949    Clinical Impression Statement Patient presented almost 4 weeks since initial evaluation due to sustaining a fall about a week after initial eval. Patient is very unsteady on her feet with step to gait pattern with shuffling tendencies and PT is in strong agreement patient requires use of RW instead of SPC. Patient is severely deconditioned and session focused on initiating well tolerated exercise (nustep not hurting) and educating patient and practicing using rollator. Patient appears to understand how to use and is capable of properly handling rollator and PT recommended it to her over the RW so that she can sit when tired and radicular symptoms in her legs are causing her to need rest from standing. Patient did experience increase in L LE symptoms by end of session up to 7/10 from 5/10 but was very pleased with what she accomplished today. Husband updated on recommendations and patient provided with information about how to get rollator. Patient would benefit from continued management of limiting condition by skilled physical therapist to address remaining impairments and functional limitations to work towards stated goals and return to PLOF or maximal functional independence.    Personal Factors and Comorbidities Age;Time since onset of injury/illness/exacerbation;Comorbidity 3+;Fitness;Past/Current Experience    Comorbidities Relevant past medical history and comorbidities include ovarian cancer, basal cell cancer on back 05/2016, parkinson'onianism (dx in last year - states did not do well on parkinson's medication), restless leg syndrome, REM behavior disorder and insomnia, OSA, generalized sensory peripheral neuropathy,  HTN, hypothyroidism,   osteoporosis, congestive heart failure, essential tremor, back surgery (chart shows laminectomy, posterior lumbar facetectomy and foraminotmomy w/decomp n/a 04/19/2015), neck surgery (04/17/11 C3-7 DECOMP LAM AND LEFT SIDED PROXIMAL FORAMINOTOMIES), hx R ankle fracture surgery (pt reports instrumentation removed due  to being allergic), exploratory laparotomy with removal of part of small intestine in 2017.    Examination-Activity Limitations Stairs;Carry;Locomotion Level;Squat;Transfers;Stand;Bed Mobility;Caring for Others;Bend;Lift    Examination-Participation Restrictions Yard Work;Community Activity;Shop;Driving;Cleaning;Laundry;Meal Prep;Interpersonal Relationship    Stability/Clinical Decision Making Evolving/Moderate complexity    Rehab Potential Fair    PT Frequency 2x / week    PT Duration 12 weeks    PT Treatment/Interventions Patient/family education;Therapeutic exercise;Balance training;Therapeutic activities;Gait training;ADLs/Self Care Home Management;Moist Heat;DME Instruction;Stair training;Functional mobility training;Passive range of motion;Dry needling;Joint Manipulations;Aquatic Therapy;Biofeedback;Canalith Repostioning;Cryotherapy;Neuromuscular re-education;Manual techniques;Vestibular;Electrical Stimulation;Spinal Manipulations    PT Next Visit Plan establish HEP for this episode,    PT Home Exercise Plan TBD    Consulted and Agree with Plan of Care Patient;Family member/caregiver    Family Member Consulted Husband, Fritz Pickerel           Patient will benefit from skilled therapeutic intervention in order to improve the following deficits and impairments:  Pain,Difficulty walking,Abnormal gait,Decreased balance,Decreased activity tolerance,Decreased strength,Decreased knowledge of use of DME,Impaired sensation,Decreased coordination,Decreased mobility,Decreased endurance,Decreased range of motion,Hypomobility,Impaired perceived functional ability,Obesity,Impaired flexibility  Visit  Diagnosis: Chronic bilateral low back pain with bilateral sciatica  Muscle weakness (generalized)  Unsteadiness on feet  Difficulty in walking, not elsewhere classified  Other abnormalities of gait and mobility  Right knee pain, unspecified chronicity  Left knee pain, unspecified chronicity     Problem List There are no problems to display for this patient.   Everlean Alstrom. Graylon Good, PT, DPT 10/10/20, 7:51 PM  [facility] [address] [place], [address] Phone: [phone]   Fax:  [phone]  Name: Susan Salas MRN: [id number] Date of Birth: [birth year]

## 2020-10-12 ENCOUNTER — Ambulatory Visit: Payer: Medicare Other | Admitting: Physical Therapy

## 2020-10-17 ENCOUNTER — Encounter: Payer: Self-pay | Admitting: Physical Therapy

## 2020-10-17 ENCOUNTER — Ambulatory Visit: Payer: Medicare Other | Admitting: Physical Therapy

## 2020-10-17 ENCOUNTER — Other Ambulatory Visit: Payer: Self-pay

## 2020-10-17 DIAGNOSIS — G8929 Other chronic pain: Secondary | ICD-10-CM

## 2020-10-17 DIAGNOSIS — R262 Difficulty in walking, not elsewhere classified: Secondary | ICD-10-CM

## 2020-10-17 DIAGNOSIS — R2689 Other abnormalities of gait and mobility: Secondary | ICD-10-CM

## 2020-10-17 DIAGNOSIS — R2681 Unsteadiness on feet: Secondary | ICD-10-CM

## 2020-10-17 DIAGNOSIS — M25561 Pain in right knee: Secondary | ICD-10-CM

## 2020-10-17 DIAGNOSIS — M5442 Lumbago with sciatica, left side: Secondary | ICD-10-CM | POA: Diagnosis not present

## 2020-10-17 DIAGNOSIS — M6281 Muscle weakness (generalized): Secondary | ICD-10-CM

## 2020-10-17 DIAGNOSIS — M25562 Pain in left knee: Secondary | ICD-10-CM

## 2020-10-17 NOTE — Therapy (Signed)
[facility] [address] [place], [address] Phone: [phone]   Fax:  [phone]  Physical Therapy Treatment  Patient Details  Name: Susan Salas MRN: [id number] Date of Birth: [birth year] Referring Provider (PT): Jennings Books, ND (neurology)   Encounter Date: 10/17/2020   PT End of Session - 10/17/20 1529    Visit Number 3    Number of Visits 24    Date for PT Re-Evaluation 12/05/20    Authorization Type Medicare reporting period from 09/12/2020    Progress Note Due on Visit 10    PT Start Time 1525    PT Stop Time 1555    PT Time Calculation (min) 30 min    Equipment Utilized During Treatment Gait belt    Activity Tolerance Patient tolerated treatment well;Patient limited by pain;Patient limited by fatigue    Behavior During Therapy [facility] for tasks assessed/performed           Past Medical History:  Diagnosis Date  . Cancer ([place]) 2017   ovarian  . CHF (congestive heart failure) ([place])   . Complication of anesthesia    hard to wake from general anesthesia, long porcedures nausea and vomiting.  Marland Kitchen Hypertension   . Hypothyroidism   . PONV (postoperative nausea and vomiting)   . Spinal headache    with labor of 13 year old son.    Past Surgical History:  Procedure Laterality Date  . ABDOMINAL HYSTERECTOMY    . AMPUTATION TOE Left 05/02/2017   Procedure: AMPUTATION TOE/Left 4th [id number]/28820;  Surgeon: Sharlotte Alamo, DPM;  Location: [facility] ORS;  Service: Podiatry;  Laterality: Left;  . APPENDECTOMY    . BACK SURGERY  2014   lamenectomy  . BREAST SURGERY Bilateral 2003   reduction  . CATARACT EXTRACTION W/ INTRAOCULAR LENS IMPLANT Bilateral 2014  . CHOLECYSTECTOMY    . COLON SURGERY  2017  . FRACTURE SURGERY Right 2003   screws & plates  . TONSILLECTOMY      There were no vitals filed for this visit.   Subjective Assessment - 10/17/20 1621    Subjective Patient arrived 10 min late and said her  husband has difficulty leaving on time due to watching TV and then wanting to finish his chores. States she has 5/10 pain in the left hip region. Denies any falls since last treatment session. Arrives with [facility] and support from her husband during ambulation.    Patient is accompained by: Family member    Pertinent History Patient is a 85 y.o. female who presents to outpatient physical therapy with a referral for medical diagnosis lumbar radiculopathy. This patient's chief complaints consist of bilateral low back and leg pain, knee pain, difficulty with mobility, and fear of falling, leading to the following functional deficits: "limits me from doing anything," cannot make the bed, difficulty walking, ADLs, IADLs, functional mobility. .  Relevant past medical history and comorbidities include ovarian cancer, basal cell cancer on back 05/2016, parkinson'onianism (dx in last year - states did not do well on parkinson's medication), restless leg syndrome, REM behavior disorder and insomnia, OSA, generalized sensory peripheral neuropathy,  HTN, hypothyroidism,  osteoporosis, congestive heart failure, essential tremor, back surgery (chart shows laminectomy, posterior lumbar facetectomy and foraminotmomy w/decomp n/a 04/19/2015), neck surgery (04/17/11 C3-7 DECOMP LAM AND LEFT SIDED PROXIMAL FORAMINOTOMIES), hx R ankle fracture surgery (pt reports instrumentation removed due to being allergic), exploratory laparotomy with removal of part of small intestine in 2017.  Patient  denies hx of stroke, seizures, lung problem, major cardiac events, diabetes, unexplained weight loss, changes in bowel or bladder problems when pain started (does leak a lot - put bladder in sling). Endorses stumbling and dropping things.    Limitations Standing;Walking;Lifting;House hold activities    How long can you stand comfortably? 15-60 min before she has to sit down    How long can you walk comfortably? < 10 min before she must sit down     Diagnostic tests MRI report 07/31/20: "IMPRESSION:  1. Right paracentral and right foraminal disc protrusion at L3-4  contacting and likely irritating the right L3 nerve root.  2. Wide decompressive laminectomy at L2-3, L4-5 and L5-S1 but no  recurrent disc protrusion, spinal or foraminal stenosis at these  levels.  3. Mild right lateral recess and foraminal stenosis at L1-2."  [facility] report from 06/05/2020: "Impression:   This is an abnormal electrodiagnostic study consistent with a   generalized sensory polyneuropathy."    Patient Stated Goals "to be able to walk like I used to walk"    Currently in Pain? Yes    Pain Score 5     Pain Onset More than a month ago            TREATMENT:  Therapeutic exercise:to centralize symptoms and improve ROM, strength, muscular endurance, and activity tolerance required for successful completion of functional activities.  - NuStep level 1 using bilateral upper and lower extremities. Seat/handle setting 7/9. For improved extremity mobility, muscular endurance, and activity tolerance; and to induce the analgesic effect of aerobic exercise, stimulate improved joint nutrition, and prepare body structures and systems for following interventions. x 8  Minutes.Average SPM = 66 (pain gone by end).  - mini squats with BUE support focusing on glute activation and proper form with hip hinging, stabilized back, and tibial perpendicular to floor with knees behind toes. Chair behind for safety.  3x5 (pain in anterior leg).  - standing hip abduction with BUE support, 2x10 each side. Cuing for slight hip extension.  - standing alternating double toe taps on green cone, R UE support on TM bar, L UE support on SPC. SBA for safety.  - ambulation ~150 feet with SPC and CGA for safety focusing on taking appropriate step lengths. (unsteady).  - education and discussion of how to obtain rollator with patient and husband.   Pt required multimodal cuing for proper technique and to  facilitate improved neuromuscular control, strength, range of motion, and functional ability resulting in improved performance and form. Requires CGA for all activities that does not include a secure hold on something sturdy.     PT Education - 10/17/20 1618    Education Details need for walker and where to get one. exercise instruction    Person(s) Educated Patient;Spouse    Methods Handout;Explanation;Demonstration;Tactile cues;Verbal cues    Comprehension Verbalized understanding;Returned demonstration;Verbal cues required;Tactile cues required;Need further instruction            PT Short Term Goals - 09/12/20 2001      PT SHORT TERM GOAL #1   Title Be independent with initial home exercise program for self-management of symptoms.    Baseline to be initiated at visit 2 as appropriate (09/12/2020);    Time 3    Period Weeks    Status New    Target Date 10/03/20             PT Long Term Goals - 09/12/20 2002      PT  LONG TERM GOAL #1   Title Be independent with a long-term home exercise program for self-management of symptoms.    Baseline initial HEP to be provided at visit 2 as appropriate (09/12/2020);    Time 12    Period Weeks    Status New   TARGET DATE FOR ALL LONG TERM GOALS: 12/05/2020     PT LONG TERM GOAL #2   Title Pt will improve ABC by at least 13% in order to demonstrate clinically significant improvement in balance confidence.    Baseline to be measures at visit 2 (09/12/2020);    Time 12    Period Weeks    Status New      PT LONG TERM GOAL #3   Title Demonstrate improved FOTO score by 10 units to demonstrate improvement in overall condition and self-reported functional ability.    Baseline to be assessed at visit 2 as appropriate (09/12/2020);    Time 12    Period Weeks    Status New      PT LONG TERM GOAL #4   Title Patient will improve time on Timed Get up and Go to equal or less than 15 seconds to demonstrate decreased fall risk and improved  functional moblity.    Baseline 21 seconds with BUE support off chair and SPC (09/12/2020);    Time 12    Period Weeks    Status New      PT LONG TERM GOAL #5   Title Pt will report less than 4/10 worst low back pain on NPRS in order to demonstrate clinically significant reduction in pain    Baseline 10+/10 worst (09/12/2020);    Time 12    Period Weeks    Status New                 Plan - 10/17/20 1617    Clinical Impression Statement Patent arrived 10 min late with [facility] and support from her husband. Has not yet obtained rollator. Strongly encouraged her and husband to get walker ASAP. Patient is very unsteady and deconditioned. Needed frequent rests and careful guarding. Reports decreased pain with exercise. Did not assign HEP yet due to need for supervision with exercises.  Patient would benefit from continued management of limiting condition by skilled physical therapist to address remaining impairments and functional limitations to work towards stated goals and return to PLOF or maximal functional independence.    Personal Factors and Comorbidities Age;Time since onset of injury/illness/exacerbation;Comorbidity 3+;Fitness;Past/Current Experience    Comorbidities Relevant past medical history and comorbidities include ovarian cancer, basal cell cancer on back 05/2016, parkinson'onianism (dx in last year - states did not do well on parkinson's medication), restless leg syndrome, REM behavior disorder and insomnia, OSA, generalized sensory peripheral neuropathy,  HTN, hypothyroidism,  osteoporosis, congestive heart failure, essential tremor, back surgery (chart shows laminectomy, posterior lumbar facetectomy and foraminotmomy w/decomp n/a 04/19/2015), neck surgery (04/17/11 C3-7 DECOMP LAM AND LEFT SIDED PROXIMAL FORAMINOTOMIES), hx R ankle fracture surgery (pt reports instrumentation removed due to being allergic), exploratory laparotomy with removal of part of small intestine in 2017.     Examination-Activity Limitations Stairs;Carry;Locomotion Level;Squat;Transfers;Stand;Bed Mobility;Caring for Others;Bend;Lift    Examination-Participation Restrictions Yard Work;Community Activity;Shop;Driving;Cleaning;Laundry;Meal Prep;Interpersonal Relationship    Stability/Clinical Decision Making Evolving/Moderate complexity    Rehab Potential Fair    PT Frequency 2x / week    PT Duration 12 weeks    PT Treatment/Interventions Patient/family education;Therapeutic exercise;Balance training;Therapeutic activities;Gait training;ADLs/Self Care Home Management;Moist Heat;DME Instruction;Stair training;Functional mobility training;Passive  range of motion;Dry needling;Joint Manipulations;Aquatic Therapy;Biofeedback;Canalith Repostioning;Cryotherapy;Neuromuscular re-education;Manual techniques;Vestibular;Electrical Stimulation;Spinal Manipulations    PT Next Visit Plan establish HEP for this episode,    PT Home Exercise Plan TBD    Consulted and Agree with Plan of Care Patient;Family member/caregiver    Family Member Consulted Husband, Fritz Pickerel           Patient will benefit from skilled therapeutic intervention in order to improve the following deficits and impairments:  Pain,Difficulty walking,Abnormal gait,Decreased balance,Decreased activity tolerance,Decreased strength,Decreased knowledge of use of DME,Impaired sensation,Decreased coordination,Decreased mobility,Decreased endurance,Decreased range of motion,Hypomobility,Impaired perceived functional ability,Obesity,Impaired flexibility  Visit Diagnosis: Chronic bilateral low back pain with bilateral sciatica  Muscle weakness (generalized)  Unsteadiness on feet  Difficulty in walking, not elsewhere classified  Other abnormalities of gait and mobility  Right knee pain, unspecified chronicity  Left knee pain, unspecified chronicity     Problem List There are no problems to display for this patient.   Everlean Alstrom. Graylon Good, PT,  DPT 10/17/20, 4:23 PM  [facility] [address], [place], [address] Phone: [phone]   Fax:  [phone]  Name: NEILANI DUFFEE MRN: [id number] Date of Birth: 07/14/1936

## 2020-10-24 ENCOUNTER — Encounter: Payer: Medicare Other | Admitting: Physical Therapy

## 2020-10-25 ENCOUNTER — Ambulatory Visit: Payer: Medicare Other | Admitting: Physical Therapy

## 2020-10-26 ENCOUNTER — Encounter: Payer: Medicare Other | Admitting: Physical Therapy

## 2020-10-31 ENCOUNTER — Ambulatory Visit: Payer: Medicare Other | Attending: Internal Medicine | Admitting: Physical Therapy

## 2020-10-31 ENCOUNTER — Encounter: Payer: Self-pay | Admitting: Physical Therapy

## 2020-10-31 ENCOUNTER — Other Ambulatory Visit: Payer: Self-pay

## 2020-10-31 VITALS — BP 114/70 | HR 69

## 2020-10-31 DIAGNOSIS — M5442 Lumbago with sciatica, left side: Secondary | ICD-10-CM | POA: Insufficient documentation

## 2020-10-31 DIAGNOSIS — M5441 Lumbago with sciatica, right side: Secondary | ICD-10-CM | POA: Diagnosis present

## 2020-10-31 DIAGNOSIS — M25561 Pain in right knee: Secondary | ICD-10-CM

## 2020-10-31 DIAGNOSIS — M25562 Pain in left knee: Secondary | ICD-10-CM | POA: Diagnosis present

## 2020-10-31 DIAGNOSIS — M6281 Muscle weakness (generalized): Secondary | ICD-10-CM | POA: Diagnosis present

## 2020-10-31 DIAGNOSIS — R262 Difficulty in walking, not elsewhere classified: Secondary | ICD-10-CM | POA: Diagnosis present

## 2020-10-31 DIAGNOSIS — G8929 Other chronic pain: Secondary | ICD-10-CM

## 2020-10-31 DIAGNOSIS — R2689 Other abnormalities of gait and mobility: Secondary | ICD-10-CM

## 2020-10-31 DIAGNOSIS — R2681 Unsteadiness on feet: Secondary | ICD-10-CM | POA: Diagnosis present

## 2020-10-31 NOTE — Therapy (Signed)
[facility] [address], [place], [address] Phone: [phone]   Fax:  [phone]  Physical Therapy Treatment  Patient Details  Name: Susan Salas MRN: [id number] Date of Birth: 22-Apr-1936 Referring Provider (PT): Jennings Books, ND (neurology)   Encounter Date: 10/31/2020   PT End of Session - 10/31/20 1613    Visit Number 7    Number of Visits 24    Date for PT Re-Evaluation 12/05/20    Authorization Type Medicare reporting period from 09/12/2020    Progress Note Due on Visit 10    PT Start Time 1346    PT Stop Time 1425    PT Time Calculation (min) 39 min    Equipment Utilized During Treatment Gait belt    Activity Tolerance Patient tolerated treatment well    Behavior During Therapy [facility] for tasks assessed/performed           Past Medical History:  Diagnosis Date  . Cancer ([place]) 2017   ovarian  . CHF (congestive heart failure) ([place])   . Complication of anesthesia    hard to wake from general anesthesia, long porcedures nausea and vomiting.  Marland Kitchen Hypertension   . Hypothyroidism   . PONV (postoperative nausea and vomiting)   . Spinal headache    with labor of 30 year old son.    Past Surgical History:  Procedure Laterality Date  . ABDOMINAL HYSTERECTOMY    . AMPUTATION TOE Left 05/02/2017   Procedure: AMPUTATION TOE/Left 4th mpj/28820;  Surgeon: Sharlotte Alamo, DPM;  Location: [facility] ORS;  Service: Podiatry;  Laterality: Left;  . APPENDECTOMY    . BACK SURGERY  2014   lamenectomy  . BREAST SURGERY Bilateral 2003   reduction  . CATARACT EXTRACTION W/ INTRAOCULAR LENS IMPLANT Bilateral 2014  . CHOLECYSTECTOMY    . COLON SURGERY  2017  . FRACTURE SURGERY Right 2003   screws & plates  . TONSILLECTOMY      Vitals:   10/31/20 1352  BP: 114/70  Pulse: 69  SpO2: 98%     Subjective Assessment - 10/31/20 1348    Subjective Patient reports she went to see the oncologist and they changed all of  her medications including her BP medication. She started the change two days ago. She takes 22 pils a day. She took her pain medication today. Feels like her pain is better controlled and she is not in pain upon arrival. No falls since last session. She is really careful now. Arrived with [facility]. She has gotten her rollator walker but did not bring it with her. She plans to go for a walk today. States it was really hard to find her pulse for the BP at the oncologist appointnment. CA125 was 0.7 at her last visit, which she is told she is excellent. States she felt okay after her last PT session but is still concerned about getting too sore like she did several months ago in PT.    Patient is accompained by: Family member    Pertinent History Patient is a 85 y.o. female who presents to outpatient physical therapy with a referral for medical diagnosis lumbar radiculopathy. This patient's chief complaints consist of bilateral low back and leg pain, knee pain, difficulty with mobility, and fear of falling, leading to the following functional deficits: "limits me from doing anything," cannot make the bed, difficulty walking, ADLs, IADLs, functional mobility. .  Relevant past medical history and comorbidities include ovarian cancer, basal  cell cancer on back 05/2016, parkinson'onianism (dx in last year - states did not do well on parkinson's medication), restless leg syndrome, REM behavior disorder and insomnia, OSA, generalized sensory peripheral neuropathy,  HTN, hypothyroidism,  osteoporosis, congestive heart failure, essential tremor, back surgery (chart shows laminectomy, posterior lumbar facetectomy and foraminotmomy w/decomp n/a 04/19/2015), neck surgery (04/17/11 C3-7 DECOMP LAM AND LEFT SIDED PROXIMAL FORAMINOTOMIES), hx R ankle fracture surgery (pt reports instrumentation removed due to being allergic), exploratory laparotomy with removal of part of small intestine in 2017.  Patient denies hx of stroke, seizures,  lung problem, major cardiac events, diabetes, unexplained weight loss, changes in bowel or bladder problems when pain started (does leak a lot - put bladder in sling). Endorses stumbling and dropping things.    Limitations Standing;Walking;Lifting;House hold activities    How long can you stand comfortably? 15-60 min before she has to sit down    How long can you walk comfortably? < 10 min before she must sit down    Diagnostic tests MRI report 07/31/20: "IMPRESSION:  1. Right paracentral and right foraminal disc protrusion at L3-4  contacting and likely irritating the right L3 nerve root.  2. Wide decompressive laminectomy at L2-3, L4-5 and L5-S1 but no  recurrent disc protrusion, spinal or foraminal stenosis at these  levels.  3. Mild right lateral recess and foraminal stenosis at L1-2."  [facility] report from 06/05/2020: "Impression:   This is an abnormal electrodiagnostic study consistent with a   generalized sensory polyneuropathy."    Patient Stated Goals "to be able to walk like I used to walk"    Currently in Pain? No/denies    Pain Onset --             TREATMENT: Therapeutic exercise:to centralize symptoms and improve ROM, strength, muscular endurance, and activity tolerance required for successful completion of functional activities. - vitals check to assess safety of exercise [facility] see above) -NuStep level2using bilateral upper and lower extremities. Seat/handle setting 7/9. For improved extremity mobility, muscular endurance, and activity tolerance; and to induce the analgesic effect of aerobic exercise, stimulate improved joint nutrition, and prepare body structures and systems for following interventions. x5Minutes. Average SPM = 62 - standing hip abduction with BUE support, 2x10 each side. Cuing for slight hip extension.  - standing march facing bar/counter with BUE support, 2x10 alternating sides.  - seated marching with no UE support, 2x10 each side.  - assessment of hip  mobility in sitting including patient showing how she reaches her feet. ER more limited on R > L to the point she cannot actively cross R leg over left knee. PROM flexion WFL bilaterally.  - hooklying R knee to opposite shoulder piriformis stretch, 2x30 seconds. Attempted figure 4 stretch but not able to get good stretch sensation in correct region.  - supine <> sit mod I with extra time to allow lightheadedness to resolve upon supine to sit. - ambulation ~2x50 feet with SPC and CGA for safety focusing on taking appropriate step lengths. (unsteady).  - Education on HEP including handout   Manual therapy: to reduce pain and tissue tension, improve range of motion, neuromodulation, in order to promote improved ability to complete functional activities. - supine: R hip ER PROM stretch at 90 degrees flexion, 2x 30 seconds.   End of session assessment:  Improved ability to lift R foot towards opposite knee in sitting following manual and self stretching (still not able to get all the way crossed without UE assistance).  Pt required multimodal cuing for proper technique and to facilitate improved neuromuscular control, strength, range of motion, and functional ability resulting in improved performance and form. Requires CGA for all activities that does not include a secure hold on something sturdy.   Access Code: [email or web address] URL: [email or web address] Date: 10/31/2020 Prepared by: Rosita Kea  Exercises Seated March - 1 x daily - 2 sets - 10 reps - 2 seconds hold Standing Hip Abduction with Counter Support - 1 x daily - 2 sets - 10 reps - 2 seconds hold Standing March with Counter Support - 1 x daily - 2 sets - 10 reps - 2 seconds hold Supine Piriformis Stretch with Foot on Ground - 1 x daily - 3 reps - 30-60 seconds hold    PT Education - 10/31/20 1613    Education Details exercise purpose/form. HEP    Person(s) Educated Patient    Methods Explanation;Demonstration;Tactile  cues;Verbal cues;Handout    Comprehension Verbalized understanding;Returned demonstration;Verbal cues required;Tactile cues required;Need further instruction            PT Short Term Goals - 09/12/20 2001      PT SHORT TERM GOAL #1   Title Be independent with initial home exercise program for self-management of symptoms.    Baseline to be initiated at visit 2 as appropriate (09/12/2020);    Time 3    Period Weeks    Status New    Target Date 10/03/20             PT Long Term Goals - 09/12/20 2002      PT LONG TERM GOAL #1   Title Be independent with a long-term home exercise program for self-management of symptoms.    Baseline initial HEP to be provided at visit 2 as appropriate (09/12/2020);    Time 12    Period Weeks    Status New   TARGET DATE FOR ALL LONG TERM GOALS: 12/05/2020     PT LONG TERM GOAL #2   Title Pt will improve ABC by at least 13% in order to demonstrate clinically significant improvement in balance confidence.    Baseline to be measures at visit 2 (09/12/2020);    Time 12    Period Weeks    Status New      PT LONG TERM GOAL #3   Title Demonstrate improved FOTO score by 10 units to demonstrate improvement in overall condition and self-reported functional ability.    Baseline to be assessed at visit 2 as appropriate (09/12/2020);    Time 12    Period Weeks    Status New      PT LONG TERM GOAL #4   Title Patient will improve time on Timed Get up and Go to equal or less than 15 seconds to demonstrate decreased fall risk and improved functional moblity.    Baseline 21 seconds with BUE support off chair and SPC (09/12/2020);    Time 12    Period Weeks    Status New      PT LONG TERM GOAL #5   Title Pt will report less than 4/10 worst low back pain on NPRS in order to demonstrate clinically significant reduction in pain    Baseline 10+/10 worst (09/12/2020);    Time 12    Period Weeks    Status New                 Plan - 10/31/20 1619     Clinical Impression Statement Patient arrived on  time today and tolerated treatment well overall with no unexpected symptoms after recent medication changes. Focused on activity tolerance, LE strengthening, and R hip mobility to improve patient's complaint of difficulty donning footwear. Also established initial HEP to address similar impairments. Patient reported feeling she was really benefiting from PT and showed improved R hip motion and ability to bring foot within reach of hands at end of session.  Patient would benefit from continued management of limiting condition by skilled physical therapist to address remaining impairments and functional limitations to work towards stated goals and return to PLOF or maximal functional independence.    Personal Factors and Comorbidities Age;Time since onset of injury/illness/exacerbation;Comorbidity 3+;Fitness;Past/Current Experience    Comorbidities Relevant past medical history and comorbidities include ovarian cancer, basal cell cancer on back 05/2016, parkinson'onianism (dx in last year - states did not do well on parkinson's medication), restless leg syndrome, REM behavior disorder and insomnia, OSA, generalized sensory peripheral neuropathy,  HTN, hypothyroidism,  osteoporosis, congestive heart failure, essential tremor, back surgery (chart shows laminectomy, posterior lumbar facetectomy and foraminotmomy w/decomp n/a 04/19/2015), neck surgery (04/17/11 C3-7 DECOMP LAM AND LEFT SIDED PROXIMAL FORAMINOTOMIES), hx R ankle fracture surgery (pt reports instrumentation removed due to being allergic), exploratory laparotomy with removal of part of small intestine in 2017.    Examination-Activity Limitations Stairs;Carry;Locomotion Level;Squat;Transfers;Stand;Bed Mobility;Caring for Others;Bend;Lift    Examination-Participation Restrictions Yard Work;Community Activity;Shop;Driving;Cleaning;Laundry;Meal Prep;Interpersonal Relationship    Stability/Clinical Decision  Making Evolving/Moderate complexity    Rehab Potential Fair    PT Frequency 2x / week    PT Duration 12 weeks    PT Treatment/Interventions Patient/family education;Therapeutic exercise;Balance training;Therapeutic activities;Gait training;ADLs/Self Care Home Management;Moist Heat;DME Instruction;Stair training;Functional mobility training;Passive range of motion;Dry needling;Joint Manipulations;Aquatic Therapy;Biofeedback;Canalith Repostioning;Cryotherapy;Neuromuscular re-education;Manual techniques;Vestibular;Electrical Stimulation;Spinal Manipulations    PT Next Visit Plan Continue working on activity tolerance, balance, LE strength, mobility    PT Home Exercise Plan Medbridge Access Code: [email or web address]    Consulted and Agree with Plan of Care Patient;Family member/caregiver    Family Member Consulted Husband, Fritz Pickerel           Patient will benefit from skilled therapeutic intervention in order to improve the following deficits and impairments:  Pain,Difficulty walking,Abnormal gait,Decreased balance,Decreased activity tolerance,Decreased strength,Decreased knowledge of use of DME,Impaired sensation,Decreased coordination,Decreased mobility,Decreased endurance,Decreased range of motion,Hypomobility,Impaired perceived functional ability,Obesity,Impaired flexibility  Visit Diagnosis: Chronic bilateral low back pain with bilateral sciatica  Muscle weakness (generalized)  Unsteadiness on feet  Difficulty in walking, not elsewhere classified  Other abnormalities of gait and mobility  Right knee pain, unspecified chronicity  Left knee pain, unspecified chronicity     Problem List There are no problems to display for this patient.   Everlean Alstrom. Graylon Good, PT, DPT 10/31/20, 4:22 PM  [facility] [address] [place], [address] Phone: [phone]   Fax:  [phone]  Name: YIANNA TERSIGNI MRN: [id number] Date of  Birth: [birth year]

## 2020-11-02 ENCOUNTER — Encounter: Payer: Self-pay | Admitting: Physical Therapy

## 2020-11-02 ENCOUNTER — Other Ambulatory Visit: Payer: Self-pay

## 2020-11-02 ENCOUNTER — Ambulatory Visit: Payer: Medicare Other | Admitting: Physical Therapy

## 2020-11-02 DIAGNOSIS — M5442 Lumbago with sciatica, left side: Secondary | ICD-10-CM | POA: Diagnosis not present

## 2020-11-02 DIAGNOSIS — R2689 Other abnormalities of gait and mobility: Secondary | ICD-10-CM

## 2020-11-02 DIAGNOSIS — M5441 Lumbago with sciatica, right side: Secondary | ICD-10-CM

## 2020-11-02 DIAGNOSIS — R2681 Unsteadiness on feet: Secondary | ICD-10-CM

## 2020-11-02 DIAGNOSIS — R262 Difficulty in walking, not elsewhere classified: Secondary | ICD-10-CM

## 2020-11-02 DIAGNOSIS — G8929 Other chronic pain: Secondary | ICD-10-CM

## 2020-11-02 DIAGNOSIS — M25562 Pain in left knee: Secondary | ICD-10-CM

## 2020-11-02 DIAGNOSIS — M6281 Muscle weakness (generalized): Secondary | ICD-10-CM

## 2020-11-02 DIAGNOSIS — M25561 Pain in right knee: Secondary | ICD-10-CM

## 2020-11-02 NOTE — Therapy (Signed)
[facility] [address], [place], [address] Phone: [phone]   Fax:  [phone]  Physical Therapy Treatment  Patient Details  Name: Susan Salas MRN: [id number] Date of Birth: [birth year] Referring Provider (PT): Jennings Books, ND (neurology)   Encounter Date: 11/02/2020   PT End of Session - 11/02/20 1620    Visit Number 5    Number of Visits 24    Date for PT Re-Evaluation 12/05/20    Authorization Type Medicare reporting period from 09/12/2020    Progress Note Due on Visit 10    PT Start Time 1347    PT Stop Time 1430    PT Time Calculation (min) 43 min    Equipment Utilized During Treatment Gait belt    Activity Tolerance Patient tolerated treatment well    Behavior During Therapy [facility] for tasks assessed/performed           Past Medical History:  Diagnosis Date  . Cancer ([place]) 2017   ovarian  . CHF (congestive heart failure) ([place])   . Complication of anesthesia    hard to wake from general anesthesia, long porcedures nausea and vomiting.  Marland Kitchen Hypertension   . Hypothyroidism   . PONV (postoperative nausea and vomiting)   . Spinal headache    with labor of 33 year old son.    Past Surgical History:  Procedure Laterality Date  . ABDOMINAL HYSTERECTOMY    . AMPUTATION TOE Left 05/02/2017   Procedure: AMPUTATION TOE/Left 4th mpj/28820;  Surgeon: Sharlotte Alamo, DPM;  Location: [facility] ORS;  Service: Podiatry;  Laterality: Left;  . APPENDECTOMY    . BACK SURGERY  2014   lamenectomy  . BREAST SURGERY Bilateral 2003   reduction  . CATARACT EXTRACTION W/ INTRAOCULAR LENS IMPLANT Bilateral 2014  . CHOLECYSTECTOMY    . COLON SURGERY  2017  . FRACTURE SURGERY Right 2003   screws & plates  . TONSILLECTOMY      There were no vitals filed for this visit.   Subjective Assessment - 11/02/20 1617    Subjective Patient reports she is feeling well with no pain upon arrival. States she went home and fell  asleep in front of the TV instead of going for a walk in her neighborhood as planned. Arrivd with her rollator walker today and states she "loves it" and it is her first trip into the community with it. Plans to use it at [facility] later today and get some more walking in. No falls since last session. Continues to have difficulty bringing R foot up to cross over left knee. Tried hip stretch but had a hard time getting up after she layed down.    Patient is accompained by: Family member    Pertinent History Patient is a 85 y.o. female who presents to outpatient physical therapy with a referral for medical diagnosis lumbar radiculopathy. This patient's chief complaints consist of bilateral low back and leg pain, knee pain, difficulty with mobility, and fear of falling, leading to the following functional deficits: "limits me from doing anything," cannot make the bed, difficulty walking, ADLs, IADLs, functional mobility. .  Relevant past medical history and comorbidities include ovarian cancer, basal cell cancer on back 05/2016, parkinson'onianism (dx in last year - states did not do well on parkinson's medication), restless leg syndrome, REM behavior disorder and insomnia, OSA, generalized sensory peripheral neuropathy,  HTN, hypothyroidism,  osteoporosis, congestive heart failure, essential tremor, back surgery (chart shows laminectomy,  posterior lumbar facetectomy and foraminotmomy w/decomp n/a 04/19/2015), neck surgery (04/17/11 C3-7 DECOMP LAM AND LEFT SIDED PROXIMAL FORAMINOTOMIES), hx R ankle fracture surgery (pt reports instrumentation removed due to being allergic), exploratory laparotomy with removal of part of small intestine in 2017.  Patient denies hx of stroke, seizures, lung problem, major cardiac events, diabetes, unexplained weight loss, changes in bowel or bladder problems when pain started (does leak a lot - put bladder in sling). Endorses stumbling and dropping things.    Limitations  Standing;Walking;Lifting;House hold activities    How long can you stand comfortably? 15-60 min before she has to sit down    How long can you walk comfortably? < 10 min before she must sit down    Diagnostic tests MRI report 07/31/20: "IMPRESSION:  1. Right paracentral and right foraminal disc protrusion at L3-4  contacting and likely irritating the right L3 nerve root.  2. Wide decompressive laminectomy at L2-3, L4-5 and L5-S1 but no  recurrent disc protrusion, spinal or foraminal stenosis at these  levels.  3. Mild right lateral recess and foraminal stenosis at L1-2."  [facility] report from 06/05/2020: "Impression:   This is an abnormal electrodiagnostic study consistent with a   generalized sensory polyneuropathy."    Patient Stated Goals "to be able to walk like I used to walk"    Currently in Pain? No/denies              TREATMENT: Therapeutic exercise:to centralize symptoms and improve ROM, strength, muscular endurance, and activity tolerance required for successful completion of functional activities. - Ambulation around the clinic with Rolator and SBA with cuing for larger step distance in 6 minutes. 770 feet + 50 to treatment room.  (manual therapy - see below) - seated R hip ER figure 4 stretch in sitting including 2 reps of placing R ankle over left and back with AAROM using BUE. 2x30-60 seconds. Discussed incorporation into HEP.  - sit <> stand from 22 inch surface with no UE support x 2 - sit <> stand from 22 inch surface while holding 6# DB in both hands on ends like door knob. Included long discussion about irritability and gradual loading, emphasizing the importance of communication to prevent pain and functional difficulty due to overloading tissue. Patient agreeable and reported mild discomfort, especially at left lateral knee by end of set so discontinued for today . - supine <> sit and rolling practice on plinth with mod I due to weakness.  - ambulation~177feet with Rolator  and SBA clinic to vehicle down ramp and off curb.  - Education on HEP including handout   Manual therapy: to reduce pain and tissue tension, improve range of motion, neuromodulation, in order to promote improved ability to complete functional activities. Hooklying:  - R hip joint mobs grade III-IV, 3x30 seconds each  caudal and posterolateral. Mob belt assist.  - R hip ER stretch with contract-relax, 3x45 seconds.  Prone:  - STM to R hip external rotators to decrease tension (tender) - R hip joint mobs PA with belt assist at knee, 2x30 seconds.   End of session assessment:  Improved ability to lift R foot towards opposite knee in sitting following manual and self stretching (still not able to get all the way crossed without UE assistance).   Pt required multimodal cuing for proper technique and to facilitate improved neuromuscular control, strength, range of motion, and functional ability resulting in improved performance and form.Requires CGA for all activities that does not include a secure  hold on something sturdy.  Access Code: [email or web address] URL: [email or web address] Date: 10/31/2020 Prepared by: Rosita Kea  Exercises Seated March - 1 x daily - 2 sets - 10 reps - 2 seconds hold Standing Hip Abduction with Counter Support - 1 x daily - 2 sets - 10 reps - 2 seconds hold Standing March with Counter Support - 1 x daily - 2 sets - 10 reps - 2 seconds hold Supine Piriformis Stretch with Foot on Ground - 1 x daily - 3 reps - 30-60 seconds hold    PT Education - 11/02/20 1619    Education Details exercise purpose/form. HEP. Importance of locking rollator and keeping it with her all the way to her destination to reduce fall risk.    Person(s) Educated Patient    Methods Explanation;Demonstration;Tactile cues;Verbal cues;Handout    Comprehension Verbalized understanding;Returned demonstration;Verbal cues required;Tactile cues required;Need further instruction             PT Short Term Goals - 09/12/20 2001      PT SHORT TERM GOAL #1   Title Be independent with initial home exercise program for self-management of symptoms.    Baseline to be initiated at visit 2 as appropriate (09/12/2020);    Time 3    Period Weeks    Status New    Target Date 10/03/20             PT Long Term Goals - 09/12/20 2002      PT LONG TERM GOAL #1   Title Be independent with a long-term home exercise program for self-management of symptoms.    Baseline initial HEP to be provided at visit 2 as appropriate (09/12/2020);    Time 12    Period Weeks    Status New   TARGET DATE FOR ALL LONG TERM GOALS: 12/05/2020     PT LONG TERM GOAL #2   Title Pt will improve ABC by at least 13% in order to demonstrate clinically significant improvement in balance confidence.    Baseline to be measures at visit 2 (09/12/2020);    Time 12    Period Weeks    Status New      PT LONG TERM GOAL #3   Title Demonstrate improved FOTO score by 10 units to demonstrate improvement in overall condition and self-reported functional ability.    Baseline to be assessed at visit 2 as appropriate (09/12/2020);    Time 12    Period Weeks    Status New      PT LONG TERM GOAL #4   Title Patient will improve time on Timed Get up and Go to equal or less than 15 seconds to demonstrate decreased fall risk and improved functional moblity.    Baseline 21 seconds with BUE support off chair and SPC (09/12/2020);    Time 12    Period Weeks    Status New      PT LONG TERM GOAL #5   Title Pt will report less than 4/10 worst low back pain on NPRS in order to demonstrate clinically significant reduction in pain    Baseline 10+/10 worst (09/12/2020);    Time 12    Period Weeks    Status New                 Plan - 11/02/20 1625    Clinical Impression Statement Patient tolerated treatment well and again showed improvement in R hip AROM ER to get R foot to left knee but still needed  assistance from  hands. Updated HEP to include seated hip ER stretch that appears more accessible than supine stretch. Patient would benefit from addressing balance and short step length next session. Patient would benefit from continued management of limiting condition by skilled physical therapist to address remaining impairments and functional limitations to work towards stated goals and return to PLOF or maximal functional independence.    Personal Factors and Comorbidities Age;Time since onset of injury/illness/exacerbation;Comorbidity 3+;Fitness;Past/Current Experience    Comorbidities Relevant past medical history and comorbidities include ovarian cancer, basal cell cancer on back 05/2016, parkinson'onianism (dx in last year - states did not do well on parkinson's medication), restless leg syndrome, REM behavior disorder and insomnia, OSA, generalized sensory peripheral neuropathy,  HTN, hypothyroidism,  osteoporosis, congestive heart failure, essential tremor, back surgery (chart shows laminectomy, posterior lumbar facetectomy and foraminotmomy w/decomp n/a 04/19/2015), neck surgery (04/17/11 C3-7 DECOMP LAM AND LEFT SIDED PROXIMAL FORAMINOTOMIES), hx R ankle fracture surgery (pt reports instrumentation removed due to being allergic), exploratory laparotomy with removal of part of small intestine in 2017.    Examination-Activity Limitations Stairs;Carry;Locomotion Level;Squat;Transfers;Stand;Bed Mobility;Caring for Others;Bend;Lift    Examination-Participation Restrictions Yard Work;Community Activity;Shop;Driving;Cleaning;Laundry;Meal Prep;Interpersonal Relationship    Stability/Clinical Decision Making Evolving/Moderate complexity    Rehab Potential Fair    PT Frequency 2x / week    PT Duration 12 weeks    PT Treatment/Interventions Patient/family education;Therapeutic exercise;Balance training;Therapeutic activities;Gait training;ADLs/Self Care Home Management;Moist Heat;DME Instruction;Stair training;Functional  mobility training;Passive range of motion;Dry needling;Joint Manipulations;Aquatic Therapy;Biofeedback;Canalith Repostioning;Cryotherapy;Neuromuscular re-education;Manual techniques;Vestibular;Electrical Stimulation;Spinal Manipulations    PT Next Visit Plan Continue working on activity tolerance, balance, LE strength, mobility    PT Home Exercise Plan Medbridge Access Code: [email or web address]    Consulted and Agree with Plan of Care Patient;Family member/caregiver    Family Member Consulted Husband, Fritz Pickerel           Patient will benefit from skilled therapeutic intervention in order to improve the following deficits and impairments:  Pain,Difficulty walking,Abnormal gait,Decreased balance,Decreased activity tolerance,Decreased strength,Decreased knowledge of use of DME,Impaired sensation,Decreased coordination,Decreased mobility,Decreased endurance,Decreased range of motion,Hypomobility,Impaired perceived functional ability,Obesity,Impaired flexibility  Visit Diagnosis: Chronic bilateral low back pain with bilateral sciatica  Muscle weakness (generalized)  Unsteadiness on feet  Difficulty in walking, not elsewhere classified  Other abnormalities of gait and mobility  Right knee pain, unspecified chronicity  Left knee pain, unspecified chronicity     Problem List There are no problems to display for this patient.   Everlean Alstrom. Graylon Good, PT, DPT 11/02/20, 4:25 PM  [facility] [address], [place], [address] Phone: [phone]   Fax:  [phone]  Name: Susan Salas MRN: [id number] Date of Birth: 1936-04-25

## 2020-11-07 ENCOUNTER — Encounter: Payer: Medicare Other | Admitting: Physical Therapy

## 2020-11-14 ENCOUNTER — Encounter: Payer: Self-pay | Admitting: Physical Therapy

## 2020-11-14 ENCOUNTER — Other Ambulatory Visit: Payer: Self-pay

## 2020-11-14 ENCOUNTER — Ambulatory Visit: Payer: Medicare Other | Admitting: Physical Therapy

## 2020-11-14 DIAGNOSIS — M5441 Lumbago with sciatica, right side: Secondary | ICD-10-CM

## 2020-11-14 DIAGNOSIS — M25562 Pain in left knee: Secondary | ICD-10-CM

## 2020-11-14 DIAGNOSIS — R2681 Unsteadiness on feet: Secondary | ICD-10-CM

## 2020-11-14 DIAGNOSIS — R2689 Other abnormalities of gait and mobility: Secondary | ICD-10-CM

## 2020-11-14 DIAGNOSIS — M6281 Muscle weakness (generalized): Secondary | ICD-10-CM

## 2020-11-14 DIAGNOSIS — R262 Difficulty in walking, not elsewhere classified: Secondary | ICD-10-CM

## 2020-11-14 DIAGNOSIS — M5442 Lumbago with sciatica, left side: Secondary | ICD-10-CM | POA: Diagnosis not present

## 2020-11-14 DIAGNOSIS — G8929 Other chronic pain: Secondary | ICD-10-CM

## 2020-11-14 DIAGNOSIS — M25561 Pain in right knee: Secondary | ICD-10-CM

## 2020-11-14 NOTE — Therapy (Signed)
[facility] [address] [place], [address] Phone: [phone]   Fax:  [phone]  Physical Therapy Treatment  Patient Details  Name: Susan Salas MRN: [id number] Date of Birth: Jan 30, 1936 Referring Provider (PT): Jennings Books, ND (neurology)   Encounter Date: 11/14/2020   PT End of Session - 11/14/20 1127    Visit Number 6    Number of Visits 24    Date for PT Re-Evaluation 12/05/20    Authorization Type Medicare reporting period from 09/12/2020    Progress Note Due on Visit 10    PT Start Time 0945    PT Stop Time 1025    PT Time Calculation (min) 40 min    Equipment Utilized During Treatment Gait belt    Activity Tolerance Patient tolerated treatment well    Behavior During Therapy [facility] for tasks assessed/performed           Past Medical History:  Diagnosis Date  . Cancer ([place]) 2017   ovarian  . CHF (congestive heart failure) ([place])   . Complication of anesthesia    hard to wake from general anesthesia, long porcedures nausea and vomiting.  Marland Kitchen Hypertension   . Hypothyroidism   . PONV (postoperative nausea and vomiting)   . Spinal headache    with labor of 46 year old son.    Past Surgical History:  Procedure Laterality Date  . ABDOMINAL HYSTERECTOMY    . AMPUTATION TOE Left 05/02/2017   Procedure: AMPUTATION TOE/Left 4th mpj/28820;  Surgeon: Sharlotte Alamo, DPM;  Location: [facility] ORS;  Service: Podiatry;  Laterality: Left;  . APPENDECTOMY    . BACK SURGERY  2014   lamenectomy  . BREAST SURGERY Bilateral 2003   reduction  . CATARACT EXTRACTION W/ INTRAOCULAR LENS IMPLANT Bilateral 2014  . CHOLECYSTECTOMY    . COLON SURGERY  2017  . FRACTURE SURGERY Right 2003   screws & plates  . TONSILLECTOMY      There were no vitals filed for this visit.   Subjective Assessment - 11/14/20 0948    Subjective Patinet reports she has pain in her lateral left thigh and knee, right ankle, and gut. Rates  pain 5/10. Reports no falls since hse was last here. Has been using her rollator. She has had trouble getting to PT at the reccomended frequency because of her and her husband's medical appointments. States she has been able to move her R leg up a little better.    Patient is accompained by: Family member    Pertinent History Patient is a 85 y.o. female who presents to outpatient physical therapy with a referral for medical diagnosis lumbar radiculopathy. This patient's chief complaints consist of bilateral low back and leg pain, knee pain, difficulty with mobility, and fear of falling, leading to the following functional deficits: "limits me from doing anything," cannot make the bed, difficulty walking, ADLs, IADLs, functional mobility. .  Relevant past medical history and comorbidities include ovarian cancer, basal cell cancer on back 05/2016, parkinson'onianism (dx in last year - states did not do well on parkinson's medication), restless leg syndrome, REM behavior disorder and insomnia, OSA, generalized sensory peripheral neuropathy,  HTN, hypothyroidism,  osteoporosis, congestive heart failure, essential tremor, back surgery (chart shows laminectomy, posterior lumbar facetectomy and foraminotmomy w/decomp n/a 04/19/2015), neck surgery (04/17/11 C3-7 DECOMP LAM AND LEFT SIDED PROXIMAL FORAMINOTOMIES), hx R ankle fracture surgery (pt reports instrumentation removed due to being allergic), exploratory laparotomy with removal of part  of small intestine in 2017.  Patient denies hx of stroke, seizures, lung problem, major cardiac events, diabetes, unexplained weight loss, changes in bowel or bladder problems when pain started (does leak a lot - put bladder in sling). Endorses stumbling and dropping things.    Limitations Standing;Walking;Lifting;House hold activities    How long can you stand comfortably? 15-60 min before she has to sit down    How long can you walk comfortably? < 10 min before she must sit down     Diagnostic tests MRI report 07/31/20: "IMPRESSION:  1. Right paracentral and right foraminal disc protrusion at L3-4  contacting and likely irritating the right L3 nerve root.  2. Wide decompressive laminectomy at L2-3, L4-5 and L5-S1 but no  recurrent disc protrusion, spinal or foraminal stenosis at these  levels.  3. Mild right lateral recess and foraminal stenosis at L1-2."  NCS report from 06/05/2020: "Impression:   This is an abnormal electrodiagnostic study consistent with a   generalized sensory polyneuropathy."    Patient Stated Goals "to be able to walk like I used to walk"    Currently in Pain? Yes    Pain Score 5             TREATMENT: Therapeutic exercise:to centralize symptoms and improve ROM, strength, muscular endurance, and activity tolerance required for successful completion of functional activities. - seated R hip ER figure 4 stretch in sitting 3x30 seconds with AAROM and overpressure from PT.  - Ambulation around the clinic with Rolator and SBA with cuing for speed and larger steps while wearing 2# AW each side.  600  feet + 400 feet. (states wearing the weights feels really good) - sit <> stand from 22 inch surface while holding 6# DB in both hands on ends like door knob. (reports pain in distal quads).  - standing dead lift with 10# KB off of yoga block, 1x10 (patient reports it feels good). Chair behind and SBA for safety.   Neuromuscular Re-education: to improve, balance, postural strength, muscle activation patterns, and stabilization strength required for functional activities: - toe tap/lateral step over 6 inch hurdle activity with min A HHA at gait belt and left hand: tap to cone (varying sizes) in front of patient with each foot, then side step over 6" hurdle. 4 cones and 3 hurdles per stretch, performed 2 times each direction.  - multidirection hurdle step activity with min A HHA at gait belt and left hand: side step over 6 inch hurdle, then forward foot  placement and push back with each foot over 6 inch hurdle in front. 2x3 each direction.  - kicking and following soccer ball around clinic two times (~ 200 feet distance) with CGA- min A at gait belt (started with left HHA but pt was ready for just at gait belt). (patient moves more quickly and naturally after ball, states she loves any activity with a ball and played all kinds of sports she was able to as a young person).   Pt required multimodal cuing for proper technique and to facilitate improved neuromuscular control, strength, range of motion, and functional ability resulting in improved performance and form.Requires CGA for all activities that does not include a secure hold on something sturdy.  Access Code: [email or web address] URL: [email or web address] Date: 10/31/2020 Prepared by: Rosita Kea  Exercises Seated March - 1 x daily - 2 sets - 10 reps - 2 seconds hold Standing Hip Abduction with Counter Support - 1 x daily - 2 sets -  10 reps - 2 seconds hold Standing March with Counter Support - 1 x daily - 2 sets - 10 reps - 2 seconds hold Supine Piriformis Stretch with Foot on Ground - 1 x daily - 3 reps - 30-60 seconds hold    PT Education - 11/14/20 1126    Education Details exercise purpose/form.  Importance of keeping it with her all the way to her destination to reduce fall risk.    Person(s) Educated Patient    Methods Explanation;Tactile cues;Demonstration;Verbal cues    Comprehension Verbalized understanding;Returned demonstration;Verbal cues required;Tactile cues required;Need further instruction            PT Short Term Goals - 09/12/20 2001      PT SHORT TERM GOAL #1   Title Be independent with initial home exercise program for self-management of symptoms.    Baseline to be initiated at visit 2 as appropriate (09/12/2020);    Time 3    Period Weeks    Status New    Target Date 10/03/20             PT Long Term Goals - 09/12/20 2002      PT LONG  TERM GOAL #1   Title Be independent with a long-term home exercise program for self-management of symptoms.    Baseline initial HEP to be provided at visit 2 as appropriate (09/12/2020);    Time 12    Period Weeks    Status New   TARGET DATE FOR ALL LONG TERM GOALS: 12/05/2020     PT LONG TERM GOAL #2   Title Pt will improve ABC by at least 13% in order to demonstrate clinically significant improvement in balance confidence.    Baseline to be measures at visit 2 (09/12/2020);    Time 12    Period Weeks    Status New      PT LONG TERM GOAL #3   Title Demonstrate improved FOTO score by 10 units to demonstrate improvement in overall condition and self-reported functional ability.    Baseline to be assessed at visit 2 as appropriate (09/12/2020);    Time 12    Period Weeks    Status New      PT LONG TERM GOAL #4   Title Patient will improve time on Timed Get up and Go to equal or less than 15 seconds to demonstrate decreased fall risk and improved functional moblity.    Baseline 21 seconds with BUE support off chair and SPC (09/12/2020);    Time 12    Period Weeks    Status New      PT LONG TERM GOAL #5   Title Pt will report less than 4/10 worst low back pain on NPRS in order to demonstrate clinically significant reduction in pain    Baseline 10+/10 worst (09/12/2020);    Time 12    Period Weeks    Status New                 Plan - 11/14/20 1137    Clinical Impression Statement Patient tolerated treatment well except had some mild increased knee/distal thigh pain with sit<>stands so only completed one set. Dead lift better tolerated and will monitor for increasing tolerance at future sessions. Concentrated on dynamic balance and coordination exercises as well as strength. Patient report she felt good at end of session and was happy with what was practiced today. Continues to be high fall risk and has difficulty getting up from a chair.  Moves better with a target and cuing  consistent with parkinson's like symptoms contributing to her balance instability. Patient would benefit from continued management of limiting condition by skilled physical therapist to address remaining impairments and functional limitations to work towards stated goals and return to PLOF or maximal functional independence.    Personal Factors and Comorbidities Age;Time since onset of injury/illness/exacerbation;Comorbidity 3+;Fitness;Past/Current Experience    Comorbidities Relevant past medical history and comorbidities include ovarian cancer, basal cell cancer on back 05/2016, parkinson'onianism (dx in last year - states did not do well on parkinson's medication), restless leg syndrome, REM behavior disorder and insomnia, OSA, generalized sensory peripheral neuropathy,  HTN, hypothyroidism,  osteoporosis, congestive heart failure, essential tremor, back surgery (chart shows laminectomy, posterior lumbar facetectomy and foraminotmomy w/decomp n/a 04/19/2015), neck surgery (04/17/11 C3-7 DECOMP LAM AND LEFT SIDED PROXIMAL FORAMINOTOMIES), hx R ankle fracture surgery (pt reports instrumentation removed due to being allergic), exploratory laparotomy with removal of part of small intestine in 2017.    Examination-Activity Limitations Stairs;Carry;Locomotion Level;Squat;Transfers;Stand;Bed Mobility;Caring for Others;Bend;Lift    Examination-Participation Restrictions Yard Work;Community Activity;Shop;Driving;Cleaning;Laundry;Meal Prep;Interpersonal Relationship    Stability/Clinical Decision Making Evolving/Moderate complexity    Rehab Potential Fair    PT Frequency 2x / week    PT Duration 12 weeks    PT Treatment/Interventions Patient/family education;Therapeutic exercise;Balance training;Therapeutic activities;Gait training;ADLs/Self Care Home Management;Moist Heat;DME Instruction;Stair training;Functional mobility training;Passive range of motion;Dry needling;Joint Manipulations;Aquatic  Therapy;Biofeedback;Canalith Repostioning;Cryotherapy;Neuromuscular re-education;Manual techniques;Vestibular;Electrical Stimulation;Spinal Manipulations    PT Next Visit Plan Continue working on activity tolerance, balance, LE strength, mobility    PT Home Exercise Plan Medbridge Access Code: [email or web address]    Consulted and Agree with Plan of Care Patient;Family member/caregiver    Family Member Consulted Husband, Fritz Pickerel           Patient will benefit from skilled therapeutic intervention in order to improve the following deficits and impairments:  Pain,Difficulty walking,Abnormal gait,Decreased balance,Decreased activity tolerance,Decreased strength,Decreased knowledge of use of DME,Impaired sensation,Decreased coordination,Decreased mobility,Decreased endurance,Decreased range of motion,Hypomobility,Impaired perceived functional ability,Obesity,Impaired flexibility  Visit Diagnosis: Chronic bilateral low back pain with bilateral sciatica  Muscle weakness (generalized)  Unsteadiness on feet  Difficulty in walking, not elsewhere classified  Other abnormalities of gait and mobility  Right knee pain, unspecified chronicity  Left knee pain, unspecified chronicity     Problem List There are no problems to display for this patient.  Everlean Alstrom. Graylon Good, PT, DPT 11/14/20, 11:39 AM  [facility] [address] [place], [address] Phone: [phone]   Fax:  [phone]  Name: Susan Salas MRN: [id number] Date of Birth: 05/31/1936

## 2020-11-16 ENCOUNTER — Ambulatory Visit: Payer: Medicare Other | Admitting: Physical Therapy

## 2020-11-16 ENCOUNTER — Encounter: Payer: Self-pay | Admitting: Physical Therapy

## 2020-11-16 ENCOUNTER — Other Ambulatory Visit: Payer: Self-pay

## 2020-11-16 DIAGNOSIS — R262 Difficulty in walking, not elsewhere classified: Secondary | ICD-10-CM

## 2020-11-16 DIAGNOSIS — M6281 Muscle weakness (generalized): Secondary | ICD-10-CM

## 2020-11-16 DIAGNOSIS — M5442 Lumbago with sciatica, left side: Secondary | ICD-10-CM | POA: Diagnosis not present

## 2020-11-16 DIAGNOSIS — R2681 Unsteadiness on feet: Secondary | ICD-10-CM

## 2020-11-16 DIAGNOSIS — R2689 Other abnormalities of gait and mobility: Secondary | ICD-10-CM

## 2020-11-16 DIAGNOSIS — G8929 Other chronic pain: Secondary | ICD-10-CM

## 2020-11-16 DIAGNOSIS — M25561 Pain in right knee: Secondary | ICD-10-CM

## 2020-11-16 DIAGNOSIS — M25562 Pain in left knee: Secondary | ICD-10-CM

## 2020-11-16 NOTE — Therapy (Signed)
[facility] [address], [place], [address] Phone: [phone]   Fax:  [phone]  Physical Therapy Treatment  Patient Details  Name: Susan Salas MRN: [id number] Date of Birth: 1935-11-28 Referring Provider (PT): Jennings Books, ND (neurology)   Encounter Date: 11/16/2020   PT End of Session - 11/16/20 1832    Visit Number 7    Number of Visits 24    Date for PT Re-Evaluation 12/05/20    Authorization Type Medicare reporting period from 09/12/2020    Progress Note Due on Visit 10    PT Start Time 1751    PT Stop Time 1815    PT Time Calculation (min) 41 min    Equipment Utilized During Treatment Gait belt    Activity Tolerance Patient tolerated treatment well;Patient limited by fatigue    Behavior During Therapy Susan Salas for tasks assessed/performed           Past Medical History:  Diagnosis Date  . Cancer ([place]) 2017   ovarian  . CHF (congestive heart failure) ([place])   . Complication of anesthesia    hard to wake from general anesthesia, long porcedures nausea and vomiting.  Marland Kitchen Hypertension   . Hypothyroidism   . PONV (postoperative nausea and vomiting)   . Spinal headache    with labor of 109 year old son.    Past Surgical History:  Procedure Laterality Date  . ABDOMINAL HYSTERECTOMY    . AMPUTATION TOE Left 05/02/2017   Procedure: AMPUTATION TOE/Left 4th mpj/28820;  Surgeon: Sharlotte Alamo, DPM;  Location: [facility] ORS;  Service: Podiatry;  Laterality: Left;  . APPENDECTOMY    . BACK SURGERY  2014   lamenectomy  . BREAST SURGERY Bilateral 2003   reduction  . CATARACT EXTRACTION W/ INTRAOCULAR LENS IMPLANT Bilateral 2014  . CHOLECYSTECTOMY    . COLON SURGERY  2017  . FRACTURE SURGERY Right 2003   screws & plates  . TONSILLECTOMY      There were no vitals filed for this visit.   Subjective Assessment - 11/16/20 1828    Subjective Patient reports she is feeling well today with no pain upon  arrival. States her B quads hurt after last session but not her knee joints. Reports she was really tired and went home and sat on the couch. States she woke up afraid her husband had passed away because he was really cold and was barely breathing. Hard to wake up but he was fine once he did. She says his GP told her that he has a tumor on his lung pushing on his heart and nothing can be done about it. She lost another family member to a similar condition 3 months ago. She states thinking her husband was dead for a bit was very scary. States her rollator walker is broken so it is being repaired and she must use the [facility] in the time being. She arrived at the clinic with [facility] and husband, Susan Salas assistance.    Patient is accompained by: Family member    Pertinent History Patient is a 85 y.o. female who presents to outpatient physical therapy with a referral for medical diagnosis lumbar radiculopathy. This patient's chief complaints consist of bilateral low back and leg pain, knee pain, difficulty with mobility, and fear of falling, leading to the following functional deficits: "limits me from doing anything," cannot make the bed, difficulty walking, ADLs, IADLs, functional mobility. .  Relevant past medical history and comorbidities include  ovarian cancer, basal cell cancer on back 05/2016, parkinson'onianism (dx in last year - states did not do well on parkinson's medication), restless leg syndrome, REM behavior disorder and insomnia, OSA, generalized sensory peripheral neuropathy,  HTN, hypothyroidism,  osteoporosis, congestive heart failure, essential tremor, back surgery (chart shows laminectomy, posterior lumbar facetectomy and foraminotmomy w/decomp n/a 04/19/2015), neck surgery (04/17/11 C3-7 DECOMP LAM AND LEFT SIDED PROXIMAL FORAMINOTOMIES), hx R ankle fracture surgery (pt reports instrumentation removed due to being allergic), exploratory laparotomy with removal of part of small intestine in 2017.  Patient  denies hx of stroke, seizures, lung problem, major cardiac events, diabetes, unexplained weight loss, changes in bowel or bladder problems when pain started (does leak a lot - put bladder in sling). Endorses stumbling and dropping things.    Limitations Standing;Walking;Lifting;House hold activities    How long can you stand comfortably? 15-60 min before she has to sit down    How long can you walk comfortably? < 10 min before she must sit down    Diagnostic tests MRI report 07/31/20: "IMPRESSION:  1. Right paracentral and right foraminal disc protrusion at L3-4  contacting and likely irritating the right L3 nerve root.  2. Wide decompressive laminectomy at L2-3, L4-5 and L5-S1 but no  recurrent disc protrusion, spinal or foraminal stenosis at these  levels.  3. Mild right lateral recess and foraminal stenosis at L1-2."  NCS report from 06/05/2020: "Impression:   This is an abnormal electrodiagnostic study consistent with a   generalized sensory polyneuropathy."    Patient Stated Goals "to be able to walk like I used to walk"    Currently in Pain? No/denies          TREATMENT: Therapeutic exercise:to centralize symptoms and improve ROM, strength, muscular endurance, and activity tolerance required for successful completion of functional activities.  - Ambulation around the clinic with [facility] and CGA with cuing for speed and larger steps while wearing 2# AW each side.  ~300  + 500 feet. (likes the feeling of the weights, stopped to fix mask so it was not in her eyes) - sit <> stand from 22 inch surface while holding 6# DB in both hands on ends like door knob. (reports pain in B quads).  - standing dead lift with 10# KB off of yoga block, 2x10 (patient reports it feels good). Chair behind and SBA for safety.  - ambulation ~100 feet to vehicle down ramp with CGA for safety.   Manual therapy: to reduce pain and tissue tension, improve range of motion, neuromodulation, in order to promote improved  ability to complete functional activities. - brief seated STM with "the stick" assist to bilateral quads after sit <> stand exercise to decrease pain and tension.   Neuromuscular Re-education: to improve, balance, postural strength, muscle activation patterns, and stabilization strength required for functional activities: - "batting" XL theraball around the clinic with weighted pole and following/controlling ball. CGA for safety. 2 laps around clinic for improved balance/coordination.  - soccer ball kicking against wall, attempting to kick hard enough to be able to catch it with foot on rebound prior to kicking again. Completed about 75% with R LE. CGA- MinA for safety.  - multidirection hurdle step activity with min A HHA at gait belt and left hand: side step over 6 inch hurdle, then forward foot placement and push back with each foot over 6 inch hurdle in front. 3x3 each direction.   Pt required multimodal cuing for proper technique and to facilitate  improved neuromuscular control, strength, range of motion, and functional ability resulting in improved performance and form.Requires CGA-min A for all activities that does not include a secure hold on something sturdy.  Access Code: [email or web address] URL: [email or web address] Date: 10/31/2020 Prepared by: Rosita Kea  Exercises Seated March - 1 x daily - 2 sets - 10 reps - 2 seconds hold Standing Hip Abduction with Counter Support - 1 x daily - 2 sets - 10 reps - 2 seconds hold Standing March with Counter Support - 1 x daily - 2 sets - 10 reps - 2 seconds hold Supine Piriformis Stretch with Foot on Ground - 1 x daily - 3 reps - 30-60 seconds hold    PT Education - 11/16/20 1831    Education Details exercise purpose/form. Parkinson's symptoms    Person(s) Educated Patient    Methods Explanation;Demonstration;Tactile cues;Verbal cues    Comprehension Verbalized understanding;Returned demonstration;Verbal cues required;Tactile cues  required;Need further instruction            PT Short Term Goals - 09/12/20 2001      PT SHORT TERM GOAL #1   Title Be independent with initial home exercise program for self-management of symptoms.    Baseline to be initiated at visit 2 as appropriate (09/12/2020);    Time 3    Period Weeks    Status New    Target Date 10/03/20             PT Long Term Goals - 09/12/20 2002      PT LONG TERM GOAL #1   Title Be independent with a long-term home exercise program for self-management of symptoms.    Baseline initial HEP to be provided at visit 2 as appropriate (09/12/2020);    Time 12    Period Weeks    Status New   TARGET DATE FOR ALL LONG TERM GOALS: 12/05/2020     PT LONG TERM GOAL #2   Title Pt will improve ABC by at least 13% in order to demonstrate clinically significant improvement in balance confidence.    Baseline to be measures at visit 2 (09/12/2020);    Time 12    Period Weeks    Status New      PT LONG TERM GOAL #3   Title Demonstrate improved FOTO score by 10 units to demonstrate improvement in overall condition and self-reported functional ability.    Baseline to be assessed at visit 2 as appropriate (09/12/2020);    Time 12    Period Weeks    Status New      PT LONG TERM GOAL #4   Title Patient will improve time on Timed Get up and Go to equal or less than 15 seconds to demonstrate decreased fall risk and improved functional moblity.    Baseline 21 seconds with BUE support off chair and SPC (09/12/2020);    Time 12    Period Weeks    Status New      PT LONG TERM GOAL #5   Title Pt will report less than 4/10 worst low back pain on NPRS in order to demonstrate clinically significant reduction in pain    Baseline 10+/10 worst (09/12/2020);    Time 12    Period Weeks    Status New                 Plan - 11/16/20 1841    Clinical Impression Statement Patient tolerated treatment well overall but demonstrates increased fatigue by end of session and  is  limited in the amount of sit <> Stands she can complete due to B thigh pain. Demonstrates small step length and unsteadiness associated with parkinsonism. Repors her handwriting has gotten so small that she no longer does the bills and her husband asks if she is okay a lot which she wonders if this is due to muted facial expressions. Patient was able to tolerate more dead lifts this session. She moves more agilely when a ball is involved in her movement. Patient would benefit from continued management of limiting condition by skilled physical therapist to address remaining impairments and functional limitations to work towards stated goals and return to PLOF or maximal functional independence.    Personal Factors and Comorbidities Age;Time since onset of injury/illness/exacerbation;Comorbidity 3+;Fitness;Past/Current Experience    Comorbidities Relevant past medical history and comorbidities include ovarian cancer, basal cell cancer on back 05/2016, parkinson'onianism (dx in last year - states did not do well on parkinson's medication), restless leg syndrome, REM behavior disorder and insomnia, OSA, generalized sensory peripheral neuropathy,  HTN, hypothyroidism,  osteoporosis, congestive heart failure, essential tremor, back surgery (chart shows laminectomy, posterior lumbar facetectomy and foraminotmomy w/decomp n/a 04/19/2015), neck surgery (04/17/11 C3-7 DECOMP LAM AND LEFT SIDED PROXIMAL FORAMINOTOMIES), hx R ankle fracture surgery (pt reports instrumentation removed due to being allergic), exploratory laparotomy with removal of part of small intestine in 2017.    Examination-Activity Limitations Stairs;Carry;Locomotion Level;Squat;Transfers;Stand;Bed Mobility;Caring for Others;Bend;Lift    Examination-Participation Restrictions Yard Work;Community Activity;Shop;Driving;Cleaning;Laundry;Meal Prep;Interpersonal Relationship    Stability/Clinical Decision Making Evolving/Moderate complexity    Rehab Potential  Fair    PT Frequency 2x / week    PT Duration 12 weeks    PT Treatment/Interventions Patient/family education;Therapeutic exercise;Balance training;Therapeutic activities;Gait training;ADLs/Self Care Home Management;Moist Heat;DME Instruction;Stair training;Functional mobility training;Passive range of motion;Dry needling;Joint Manipulations;Aquatic Therapy;Biofeedback;Canalith Repostioning;Cryotherapy;Neuromuscular re-education;Manual techniques;Vestibular;Electrical Stimulation;Spinal Manipulations    PT Next Visit Plan Continue working on activity tolerance, balance, LE strength, mobility    PT Home Exercise Plan Medbridge Access Code: [email or web address]    Consulted and Agree with Plan of Care Patient;Family member/caregiver    Family Member Consulted Husband, Susan Salas           Patient will benefit from skilled therapeutic intervention in order to improve the following deficits and impairments:  Pain,Difficulty walking,Abnormal gait,Decreased balance,Decreased activity tolerance,Decreased strength,Decreased knowledge of use of DME,Impaired sensation,Decreased coordination,Decreased mobility,Decreased endurance,Decreased range of motion,Hypomobility,Impaired perceived functional ability,Obesity,Impaired flexibility  Visit Diagnosis: Chronic bilateral low back pain with bilateral sciatica  Muscle weakness (generalized)  Unsteadiness on feet  Difficulty in walking, not elsewhere classified  Other abnormalities of gait and mobility  Right knee pain, unspecified chronicity  Left knee pain, unspecified chronicity     Problem List There are no problems to display for this patient.  Everlean Alstrom. Graylon Good, PT, DPT 11/16/20, 6:42 PM  [facility] [address] [place], [address] Phone: [phone]   Fax:  [phone]  Name: Susan Salas MRN: [id number] Date of Birth: March 26, 1936

## 2020-11-21 ENCOUNTER — Ambulatory Visit: Payer: Medicare Other | Admitting: Physical Therapy

## 2020-11-21 ENCOUNTER — Encounter: Payer: Medicare Other | Admitting: Physical Therapy

## 2020-11-23 ENCOUNTER — Other Ambulatory Visit: Payer: Self-pay

## 2020-11-23 ENCOUNTER — Encounter: Payer: Self-pay | Admitting: Physical Therapy

## 2020-11-23 ENCOUNTER — Ambulatory Visit: Payer: Medicare Other | Admitting: Physical Therapy

## 2020-11-23 DIAGNOSIS — R262 Difficulty in walking, not elsewhere classified: Secondary | ICD-10-CM

## 2020-11-23 DIAGNOSIS — G8929 Other chronic pain: Secondary | ICD-10-CM

## 2020-11-23 DIAGNOSIS — M25561 Pain in right knee: Secondary | ICD-10-CM

## 2020-11-23 DIAGNOSIS — R2689 Other abnormalities of gait and mobility: Secondary | ICD-10-CM

## 2020-11-23 DIAGNOSIS — M25562 Pain in left knee: Secondary | ICD-10-CM

## 2020-11-23 DIAGNOSIS — M5442 Lumbago with sciatica, left side: Secondary | ICD-10-CM | POA: Diagnosis not present

## 2020-11-23 DIAGNOSIS — R2681 Unsteadiness on feet: Secondary | ICD-10-CM

## 2020-11-23 DIAGNOSIS — M6281 Muscle weakness (generalized): Secondary | ICD-10-CM

## 2020-11-23 NOTE — Therapy (Signed)
[facility] [address], [place], [address] Phone: [phone]   Fax:  [phone]  Physical Therapy Treatment  Patient Details  Name: Susan Salas MRN: [id number] Date of Birth: 06-26-1936 Referring Provider (PT): Jennings Books, ND (neurology)   Encounter Date: 11/23/2020   PT End of Session - 11/23/20 1002    Visit Number 8    Number of Visits 24    Date for PT Re-Evaluation 12/05/20    Authorization Type Medicare reporting period from 09/12/2020    Progress Note Due on Visit 10    PT Start Time 0952    PT Stop Time 1030    PT Time Calculation (min) 38 min    Equipment Utilized During Treatment Gait belt    Activity Tolerance Patient tolerated treatment well;Patient limited by fatigue;Patient limited by pain    Behavior During Therapy [facility] for tasks assessed/performed           Past Medical History:  Diagnosis Date  . Cancer ([place]) 2017   ovarian  . CHF (congestive heart failure) ([place])   . Complication of anesthesia    hard to wake from general anesthesia, long porcedures nausea and vomiting.  Marland Kitchen Hypertension   . Hypothyroidism   . PONV (postoperative nausea and vomiting)   . Spinal headache    with labor of 80 year old son.    Past Surgical History:  Procedure Laterality Date  . ABDOMINAL HYSTERECTOMY    . AMPUTATION TOE Left 05/02/2017   Procedure: AMPUTATION TOE/Left 4th [id number]/28820;  Surgeon: Sharlotte Alamo, DPM;  Location: [facility] ORS;  Service: Podiatry;  Laterality: Left;  . APPENDECTOMY    . BACK SURGERY  2014   lamenectomy  . BREAST SURGERY Bilateral 2003   reduction  . CATARACT EXTRACTION W/ INTRAOCULAR LENS IMPLANT Bilateral 2014  . CHOLECYSTECTOMY    . COLON SURGERY  2017  . FRACTURE SURGERY Right 2003   screws & plates  . TONSILLECTOMY      There were no vitals filed for this visit.   Subjective Assessment - 11/23/20 0953    Subjective Patient reports she has had soreness in  her left leg since her last PT session. States it is 1-2/10 when sitting and 5-6/10 when standing. She was at the clinic for a long time for her husband's appointment with a frustrating outcome. Hours of waiting. Forgot her cane this morning. She came in with her husband's hand held assistance. She states her husband has noticed she is taking bigger steps. Her husband raised her toilet seat even more so she doens't hurt as much getting up/down. Patient states that picking up the weight and standing up and down made her pain worse last session. States her left knee is "killing" her getting up and down but some days she can do it and it doesn't hurt.    Patient is accompained by: Family member    Pertinent History Patient is a 85 y.o. female who presents to outpatient physical therapy with a referral for medical diagnosis lumbar radiculopathy. This patient's chief complaints consist of bilateral low back and leg pain, knee pain, difficulty with mobility, and fear of falling, leading to the following functional deficits: "limits me from doing anything," cannot make the bed, difficulty walking, ADLs, IADLs, functional mobility. .  Relevant past medical history and comorbidities include ovarian cancer, basal cell cancer on back 05/2016, parkinson'onianism (dx in last year - states did not do well on parkinson's  medication), restless leg syndrome, REM behavior disorder and insomnia, OSA, generalized sensory peripheral neuropathy,  HTN, hypothyroidism,  osteoporosis, congestive heart failure, essential tremor, back surgery (chart shows laminectomy, posterior lumbar facetectomy and foraminotmomy w/decomp n/a 04/19/2015), neck surgery (04/17/11 C3-7 DECOMP LAM AND LEFT SIDED PROXIMAL FORAMINOTOMIES), hx R ankle fracture surgery (pt reports instrumentation removed due to being allergic), exploratory laparotomy with removal of part of small intestine in 2017.  Patient denies hx of stroke, seizures, lung problem, major cardiac  events, diabetes, unexplained weight loss, changes in bowel or bladder problems when pain started (does leak a lot - put bladder in sling). Endorses stumbling and dropping things.    Limitations Standing;Walking;Lifting;House hold activities    How long can you stand comfortably? 15-60 min before she has to sit down    How long can you walk comfortably? < 10 min before she must sit down    Diagnostic tests MRI report 07/31/20: "IMPRESSION:  1. Right paracentral and right foraminal disc protrusion at L3-4  contacting and likely irritating the right L3 nerve root.  2. Wide decompressive laminectomy at L2-3, L4-5 and L5-S1 but no  recurrent disc protrusion, spinal or foraminal stenosis at these  levels.  3. Mild right lateral recess and foraminal stenosis at L1-2."  [facility] report from 06/05/2020: "Impression:   This is an abnormal electrodiagnostic study consistent with a   generalized sensory polyneuropathy."    Patient Stated Goals "to be able to walk like I used to walk"    Currently in Pain? Yes    Pain Score 2     Pain Location Leg    Pain Orientation Left             TREATMENT: Therapeutic exercise:to centralize symptoms and improve ROM, strength, muscular endurance, and activity tolerance required for successful completion of functional activities. -NuStep level3using bilateral upper and lower extremities. Seat/handle setting 7/9. For improved extremity mobility, muscular endurance, and activity tolerance; and to induce the analgesic effect of aerobic exercise, stimulate improved joint nutrition, and prepare body structures and systems for following interventions. x5Minutes. Average SPM =67 - supine <> sit with mod I for increased time.   (manual therapy - see below) - seated long arc quad, 5#AW, 3x10 each side.  - ambulation ~100 feet to vehicle down ramp with SPC and CGA for safety.   Neuromuscular Re-education: to improve, balance, postural strength, muscle activation patterns,  and stabilization strength required for functional activities: - standing toe tap each side on 8 inch cone, step sideways over 6 inch hurdle, 1x10 each side with hand held min A for balance.   Manual therapy: to reduce pain and tissue tension, improve range of motion, neuromodulation, in order to promote improved ability to complete functional activities. - hooklying STM with "the stick" assist to  L quads, lateral thigh, adductors, hamstrings  - hookling LAD through R and L hip 2x30 seconds each - hooklying R knee PA and AP tibiofemoral joint mobilizations grade II, 2x30 seconds each way for pain relief (no pain following).   Pt required multimodal cuing for proper technique and to facilitate improved neuromuscular control, strength, range of motion, and functional ability resulting in improved performance and form.Requires CGA-min A for all activities that does not include a secure hold on something sturdy. Provided clinic [facility] for use with ambulation during session.   HOME EXERCISE PROGRAM Access Code: [email or web address] URL: [email or web address] Date: 10/31/2020 Prepared by: Rosita Kea  Exercises Seated March - 1 x daily - 2 sets -  10 reps - 2 seconds hold Standing Hip Abduction with Counter Support - 1 x daily - 2 sets - 10 reps - 2 seconds hold Standing March with Counter Support - 1 x daily - 2 sets - 10 reps - 2 seconds hold Supine Piriformis Stretch with Foot on Ground - 1 x daily - 3 reps - 30-60 seconds hold    PT Education - 11/23/20 1002    Education Details exercise purpose/form.    Person(s) Educated Patient    Methods Explanation;Demonstration;Tactile cues;Verbal cues    Comprehension Verbalized understanding;Returned demonstration;Verbal cues required;Tactile cues required;Need further instruction            PT Short Term Goals - 09/12/20 2001      PT SHORT TERM GOAL #1   Title Be independent with initial home exercise program for self-management of  symptoms.    Baseline to be initiated at visit 2 as appropriate (09/12/2020);    Time 3    Period Weeks    Status New    Target Date 10/03/20             PT Long Term Goals - 09/12/20 2002      PT LONG TERM GOAL #1   Title Be independent with a long-term home exercise program for self-management of symptoms.    Baseline initial HEP to be provided at visit 2 as appropriate (09/12/2020);    Time 12    Period Weeks    Status New   TARGET DATE FOR ALL LONG TERM GOALS: 12/05/2020     PT LONG TERM GOAL #2   Title Pt will improve ABC by at least 13% in order to demonstrate clinically significant improvement in balance confidence.    Baseline to be measures at visit 2 (09/12/2020);    Time 12    Period Weeks    Status New      PT LONG TERM GOAL #3   Title Demonstrate improved FOTO score by 10 units to demonstrate improvement in overall condition and self-reported functional ability.    Baseline to be assessed at visit 2 as appropriate (09/12/2020);    Time 12    Period Weeks    Status New      PT LONG TERM GOAL #4   Title Patient will improve time on Timed Get up and Go to equal or less than 15 seconds to demonstrate decreased fall risk and improved functional moblity.    Baseline 21 seconds with BUE support off chair and SPC (09/12/2020);    Time 12    Period Weeks    Status New      PT LONG TERM GOAL #5   Title Pt will report less than 4/10 worst low back pain on NPRS in order to demonstrate clinically significant reduction in pain    Baseline 10+/10 worst (09/12/2020);    Time 12    Period Weeks    Status New                 Plan - 11/23/20 1419    Clinical Impression Statement Patient complains of more pain today, especially with weight bearing, compared to past sessions. Modified exercises to improve tolerance and utilized manual therapy for pain reduction. patient reported no pain with walking by end of session. Patient continues to lack balance and functional LE  strength but has difficulty advancing exercises for LE functional strengthening due to pain response. Patient would benefit from continued management of limiting condition by skilled physical  therapist to address remaining impairments and functional limitations to work towards stated goals and return to PLOF or maximal functional independence.    Personal Factors and Comorbidities Age;Time since onset of injury/illness/exacerbation;Comorbidity 3+;Fitness;Past/Current Experience    Comorbidities Relevant past medical history and comorbidities include ovarian cancer, basal cell cancer on back 05/2016, parkinson'onianism (dx in last year - states did not do well on parkinson's medication), restless leg syndrome, REM behavior disorder and insomnia, OSA, generalized sensory peripheral neuropathy,  HTN, hypothyroidism,  osteoporosis, congestive heart failure, essential tremor, back surgery (chart shows laminectomy, posterior lumbar facetectomy and foraminotmomy w/decomp n/a 04/19/2015), neck surgery (04/17/11 C3-7 DECOMP LAM AND LEFT SIDED PROXIMAL FORAMINOTOMIES), hx R ankle fracture surgery (pt reports instrumentation removed due to being allergic), exploratory laparotomy with removal of part of small intestine in 2017.    Examination-Activity Limitations Stairs;Carry;Locomotion Level;Squat;Transfers;Stand;Bed Mobility;Caring for Others;Bend;Lift    Examination-Participation Restrictions Yard Work;Community Activity;Shop;Driving;Cleaning;Laundry;Meal Prep;Interpersonal Relationship    Stability/Clinical Decision Making Evolving/Moderate complexity    Rehab Potential Fair    PT Frequency 2x / week    PT Duration 12 weeks    PT Treatment/Interventions Patient/family education;Therapeutic exercise;Balance training;Therapeutic activities;Gait training;ADLs/Self Care Home Management;Moist Heat;DME Instruction;Stair training;Functional mobility training;Passive range of motion;Dry needling;Joint Manipulations;Aquatic  Therapy;Biofeedback;Canalith Repostioning;Cryotherapy;Neuromuscular re-education;Manual techniques;Vestibular;Electrical Stimulation;Spinal Manipulations    PT Next Visit Plan Continue working on activity tolerance, balance, LE strength, mobility    PT Home Exercise Plan Medbridge Access Code: [email or web address]    Consulted and Agree with Plan of Care Patient;Family member/caregiver    Family Member Consulted Husband, Fritz Pickerel           Patient will benefit from skilled therapeutic intervention in order to improve the following deficits and impairments:  Pain,Difficulty walking,Abnormal gait,Decreased balance,Decreased activity tolerance,Decreased strength,Decreased knowledge of use of DME,Impaired sensation,Decreased coordination,Decreased mobility,Decreased endurance,Decreased range of motion,Hypomobility,Impaired perceived functional ability,Obesity,Impaired flexibility  Visit Diagnosis: Chronic bilateral low back pain with bilateral sciatica  Muscle weakness (generalized)  Unsteadiness on feet  Difficulty in walking, not elsewhere classified  Other abnormalities of gait and mobility  Right knee pain, unspecified chronicity  Left knee pain, unspecified chronicity     Problem List There are no problems to display for this patient.   Everlean Alstrom. Graylon Good, PT, DPT 11/23/20, 2:21 PM  [facility] [address], [place], [address] Phone: [phone]   Fax:  [phone]  Name: Susan Salas MRN: [id number] Date of Birth: 04-10-1936

## 2020-11-28 ENCOUNTER — Ambulatory Visit: Payer: Medicare Other | Admitting: Physical Therapy

## 2020-11-28 ENCOUNTER — Encounter: Payer: Self-pay | Admitting: Physical Therapy

## 2020-11-28 ENCOUNTER — Other Ambulatory Visit: Payer: Self-pay

## 2020-11-28 DIAGNOSIS — G8929 Other chronic pain: Secondary | ICD-10-CM

## 2020-11-28 DIAGNOSIS — M5442 Lumbago with sciatica, left side: Secondary | ICD-10-CM | POA: Diagnosis not present

## 2020-11-28 DIAGNOSIS — R2689 Other abnormalities of gait and mobility: Secondary | ICD-10-CM

## 2020-11-28 DIAGNOSIS — M6281 Muscle weakness (generalized): Secondary | ICD-10-CM

## 2020-11-28 DIAGNOSIS — M5441 Lumbago with sciatica, right side: Secondary | ICD-10-CM

## 2020-11-28 DIAGNOSIS — R262 Difficulty in walking, not elsewhere classified: Secondary | ICD-10-CM

## 2020-11-28 DIAGNOSIS — M25562 Pain in left knee: Secondary | ICD-10-CM

## 2020-11-28 DIAGNOSIS — M25561 Pain in right knee: Secondary | ICD-10-CM

## 2020-11-28 DIAGNOSIS — R2681 Unsteadiness on feet: Secondary | ICD-10-CM

## 2020-11-28 NOTE — Therapy (Signed)
[place] [facility] [address], [place], [address] Phone: [phone]   Fax:  [phone]  Physical Therapy Treatment  Patient Details  Name: Susan Salas MRN: [id number] Date of Birth: May 27, 1936 Referring Provider (PT): Jennings Books, ND (neurology)   Encounter Date: 11/28/2020   PT End of Session - 11/28/20 1944    Visit Number 9    Number of Visits 24    Date for PT Re-Evaluation 12/05/20    Authorization Type Medicare reporting period from 09/12/2020    Progress Note Due on Visit 10    PT Start Time 1430    PT Stop Time 1510    PT Time Calculation (min) 40 min    Equipment Utilized During Treatment Gait belt    Activity Tolerance Patient tolerated treatment well;Patient limited by fatigue;Patient limited by pain    Behavior During Therapy [facility] for tasks assessed/performed           Past Medical History:  Diagnosis Date  . Cancer ([place]) 2017   ovarian  . CHF (congestive heart failure) ([place])   . Complication of anesthesia    hard to wake from general anesthesia, long porcedures nausea and vomiting.  Marland Kitchen Hypertension   . Hypothyroidism   . PONV (postoperative nausea and vomiting)   . Spinal headache    with labor of 28 year old son.    Past Surgical History:  Procedure Laterality Date  . ABDOMINAL HYSTERECTOMY    . AMPUTATION TOE Left 05/02/2017   Procedure: AMPUTATION TOE/Left 4th mpj/28820;  Surgeon: Sharlotte Alamo, DPM;  Location: [facility] ORS;  Service: Podiatry;  Laterality: Left;  . APPENDECTOMY    . BACK SURGERY  2014   lamenectomy  . BREAST SURGERY Bilateral 2003   reduction  . CATARACT EXTRACTION W/ INTRAOCULAR LENS IMPLANT Bilateral 2014  . CHOLECYSTECTOMY    . COLON SURGERY  2017  . FRACTURE SURGERY Right 2003   screws & plates  . TONSILLECTOMY      There were no vitals filed for this visit.   Subjective Assessment - 11/28/20 1443    Subjective Patient reports she has 5/10 pain in her  legs, L > R, when attempting to get up after prolonged sititng. No falls since last time. Continues to use SPC due to rollator being broken. States she will have to pay ~ $150 (half off) for a new rollator because her husband tried to fix her original one before they took it to be repaired.    Patient is accompained by: Family member    Pertinent History Patient is a 85 y.o. female who presents to outpatient physical therapy with a referral for medical diagnosis lumbar radiculopathy. This patient's chief complaints consist of bilateral low back and leg pain, knee pain, difficulty with mobility, and fear of falling, leading to the following functional deficits: "limits me from doing anything," cannot make the bed, difficulty walking, ADLs, IADLs, functional mobility. .  Relevant past medical history and comorbidities include ovarian cancer, basal cell cancer on back 05/2016, parkinson'onianism (dx in last year - states did not do well on parkinson's medication), restless leg syndrome, REM behavior disorder and insomnia, OSA, generalized sensory peripheral neuropathy,  HTN, hypothyroidism,  osteoporosis, congestive heart failure, essential tremor, back surgery (chart shows laminectomy, posterior lumbar facetectomy and foraminotmomy w/decomp n/a 04/19/2015), neck surgery (04/17/11 C3-7 DECOMP LAM AND LEFT SIDED PROXIMAL FORAMINOTOMIES), hx R ankle fracture surgery (pt reports instrumentation removed due to being allergic), exploratory  laparotomy with removal of part of small intestine in 2017.  Patient denies hx of stroke, seizures, lung problem, major cardiac events, diabetes, unexplained weight loss, changes in bowel or bladder problems when pain started (does leak a lot - put bladder in sling). Endorses stumbling and dropping things.    Limitations Standing;Walking;Lifting;House hold activities    How long can you stand comfortably? 15-60 min before she has to sit down    How long can you walk comfortably? < 10  min before she must sit down    Diagnostic tests MRI report 07/31/20: "IMPRESSION:  1. Right paracentral and right foraminal disc protrusion at L3-4  contacting and likely irritating the right L3 nerve root.  2. Wide decompressive laminectomy at L2-3, L4-5 and L5-S1 but no  recurrent disc protrusion, spinal or foraminal stenosis at these  levels.  3. Mild right lateral recess and foraminal stenosis at L1-2."  NCS report from 06/05/2020: "Impression:   This is an abnormal electrodiagnostic study consistent with a   generalized sensory polyneuropathy."    Patient Stated Goals "to be able to walk like I used to walk"    Currently in Pain? Yes    Pain Score 5              TREATMENT: Therapeutic exercise:to centralize symptoms and improve ROM, strength, muscular endurance, and activity tolerance required for successful completion of functional activities. - Ambulation around the clinic with [facility] and CGA with cuing forspeed andlarger steps while wearing 2# AW each side (last 250 feet). ~750 feet. - seated long arc quad, 5#AW, 3x10 each side.  - standing deadlift: RDL picking up 10#KB from yoga block between feet. CGA for safety. 1x10 - seated hamstring curl rolling 3kg med ball with foot on floor, 3x10 each side.  - ambulation ~100 feet to vehicle down ramp with SPC and CGA for safety.  Neuromuscular Re-education: to improve, balance, postural strength, muscle activation patterns, and stabilization strength required for functional activities: - standing toe tap each side on 8 inch cone, step sideways over 6 inch hurdle, 1x10 each side with hand held min A for balance.  - multidirection hurdle step activity with min A HHA at gait belt and left hand: side step over 6 inch hurdle, then forward foot placement and push back with each foot over 6 inch hurdle in front. 2x3 each direction - shag dance steps (FW/BW and kick) with min A to prevent falling  Pt required multimodal cuing for proper  technique and to facilitate improved neuromuscular control, strength, range of motion, and functional ability resulting in improved performance and form.Requires CGA-min Afor all activities that does not include a secure hold on something sturdy. Provided clinic [facility] for use with ambulation during session.   HOME EXERCISE PROGRAM Access Code: [email or web address] URL: [email or web address] Date: 10/31/2020 Prepared by: Rosita Kea  Exercises Seated Susan - 1 x daily - 2 sets - 10 reps - 2 seconds hold Standing Hip Abduction with Counter Support - 1 x daily - 2 sets - 10 reps - 2 seconds hold Standing Susan with Counter Support - 1 x daily - 2 sets - 10 reps - 2 seconds hold Supine Piriformis Stretch with Foot on Ground - 1 x daily - 3 reps - 30-60 seconds hold     PT Education - 11/28/20 1945    Education Details exercise purpose/form. importance of using a walker    Person(s) Educated Patient    Methods Explanation;Demonstration;Verbal cues;Tactile cues    Comprehension  Verbalized understanding;Returned demonstration;Verbal cues required;Tactile cues required;Need further instruction            PT Short Term Goals - 09/12/20 2001      PT SHORT TERM GOAL #1   Title Be independent with initial home exercise program for self-management of symptoms.    Baseline to be initiated at visit 2 as appropriate (09/12/2020);    Time 3    Period Weeks    Status New    Target Date 10/03/20             PT Long Term Goals - 09/12/20 2002      PT LONG TERM GOAL #1   Title Be independent with a long-term home exercise program for self-management of symptoms.    Baseline initial HEP to be provided at visit 2 as appropriate (09/12/2020);    Time 12    Period Weeks    Status New   TARGET DATE FOR ALL LONG TERM GOALS: 12/05/2020     PT LONG TERM GOAL #2   Title Pt will improve ABC by at least 13% in order to demonstrate clinically significant improvement in balance confidence.     Baseline to be measures at visit 2 (09/12/2020);    Time 12    Period Weeks    Status New      PT LONG TERM GOAL #3   Title Demonstrate improved FOTO score by 10 units to demonstrate improvement in overall condition and self-reported functional ability.    Baseline to be assessed at visit 2 as appropriate (09/12/2020);    Time 12    Period Weeks    Status New      PT LONG TERM GOAL #4   Title Patient will improve time on Timed Get up and Go to equal or less than 15 seconds to demonstrate decreased fall risk and improved functional moblity.    Baseline 21 seconds with BUE support off chair and SPC (09/12/2020);    Time 12    Period Weeks    Status New      PT LONG TERM GOAL #5   Title Pt will report less than 4/10 worst low back pain on NPRS in order to demonstrate clinically significant reduction in pain    Baseline 10+/10 worst (09/12/2020);    Time 12    Period Weeks    Status New                 Plan - 11/28/20 1944    Clinical Impression Statement Patient tolerated treatment well overall with no increase in pain by end of session. Continued to work on improved LE and balance. Demonstrates inability to get up from chair without use of B UE and reports pain in knees with sit <> stand. Continues to be high fall risk and continue to recommend she uses her RW or rollator (which continues to be out of use due to being broken and possibly needing replacement with financial barriers). Patient would benefit from continued management of limiting condition by skilled physical therapist to address remaining impairments and functional limitations to work towards stated goals and return to PLOF or maximal functional independence.    Personal Factors and Comorbidities Age;Time since onset of injury/illness/exacerbation;Comorbidity 3+;Fitness;Past/Current Experience    Comorbidities Relevant past medical history and comorbidities include ovarian cancer, basal cell cancer on back 05/2016,  parkinson'onianism (dx in last year - states did not do well on parkinson's medication), restless leg syndrome, REM behavior disorder and insomnia,  OSA, generalized sensory peripheral neuropathy,  HTN, hypothyroidism,  osteoporosis, congestive heart failure, essential tremor, back surgery (chart shows laminectomy, posterior lumbar facetectomy and foraminotmomy w/decomp n/a 04/19/2015), neck surgery (04/17/11 C3-7 DECOMP LAM AND LEFT SIDED PROXIMAL FORAMINOTOMIES), hx R ankle fracture surgery (pt reports instrumentation removed due to being allergic), exploratory laparotomy with removal of part of small intestine in 2017.    Examination-Activity Limitations Stairs;Carry;Locomotion Level;Squat;Transfers;Stand;Bed Mobility;Caring for Others;Bend;Lift    Examination-Participation Restrictions Yard Work;Community Activity;Shop;Driving;Cleaning;Laundry;Meal Prep;Interpersonal Relationship    Stability/Clinical Decision Making Evolving/Moderate complexity    Rehab Potential Fair    PT Frequency 2x / week    PT Duration 12 weeks    PT Treatment/Interventions Patient/family education;Therapeutic exercise;Balance training;Therapeutic activities;Gait training;ADLs/Self Care Home Management;Moist Heat;DME Instruction;Stair training;Functional mobility training;Passive range of motion;Dry needling;Joint Manipulations;Aquatic Therapy;Biofeedback;Canalith Repostioning;Cryotherapy;Neuromuscular re-education;Manual techniques;Vestibular;Electrical Stimulation;Spinal Manipulations    PT Next Visit Plan Continue working on activity tolerance, balance, LE strength, mobility    PT Home Exercise Plan Medbridge Access Code: [email or web address]    Consulted and Agree with Plan of Care Patient;Family member/caregiver    Family Member Consulted Husband, Fritz Pickerel           Patient will benefit from skilled therapeutic intervention in order to improve the following deficits and impairments:  Pain,Difficulty walking,Abnormal gait,Decreased  balance,Decreased activity tolerance,Decreased strength,Decreased knowledge of use of DME,Impaired sensation,Decreased coordination,Decreased mobility,Decreased endurance,Decreased range of motion,Hypomobility,Impaired perceived functional ability,Obesity,Impaired flexibility  Visit Diagnosis: Chronic bilateral low back pain with bilateral sciatica  Muscle weakness (generalized)  Unsteadiness on feet  Difficulty in walking, not elsewhere classified  Other abnormalities of gait and mobility  Right knee pain, unspecified chronicity  Left knee pain, unspecified chronicity     Problem List There are no problems to display for this patient.   Everlean Alstrom. Graylon Good, PT, DPT 11/28/20, 7:47 PM  [place] [facility] [address] [place], [address] Phone: [phone]   Fax:  [phone]  Name: TULA SCHRYVER MRN: [id number] Date of Birth: [birth year]

## 2020-11-30 ENCOUNTER — Encounter: Payer: Medicare Other | Admitting: Physical Therapy

## 2020-12-04 ENCOUNTER — Encounter: Payer: Medicare Other | Admitting: Physical Therapy

## 2020-12-07 ENCOUNTER — Encounter: Payer: Medicare Other | Admitting: Physical Therapy

## 2020-12-11 ENCOUNTER — Ambulatory Visit: Payer: Medicare Other | Admitting: Physical Therapy

## 2020-12-14 ENCOUNTER — Encounter: Payer: Medicare Other | Admitting: Physical Therapy

## 2020-12-18 ENCOUNTER — Encounter: Payer: Medicare Other | Admitting: Physical Therapy

## 2020-12-21 ENCOUNTER — Encounter: Payer: Medicare Other | Admitting: Physical Therapy

## 2020-12-25 ENCOUNTER — Encounter: Payer: Medicare Other | Admitting: Physical Therapy

## 2020-12-28 ENCOUNTER — Encounter: Payer: Medicare Other | Admitting: Physical Therapy

## 2021-01-03 ENCOUNTER — Encounter: Payer: Self-pay | Admitting: Physical Therapy

## 2021-01-03 DIAGNOSIS — R2689 Other abnormalities of gait and mobility: Secondary | ICD-10-CM

## 2021-01-03 DIAGNOSIS — G8929 Other chronic pain: Secondary | ICD-10-CM

## 2021-01-03 DIAGNOSIS — M5442 Lumbago with sciatica, left side: Secondary | ICD-10-CM

## 2021-01-03 DIAGNOSIS — M25561 Pain in right knee: Secondary | ICD-10-CM

## 2021-01-03 DIAGNOSIS — M25562 Pain in left knee: Secondary | ICD-10-CM

## 2021-01-03 DIAGNOSIS — M6281 Muscle weakness (generalized): Secondary | ICD-10-CM

## 2021-01-03 DIAGNOSIS — R2681 Unsteadiness on feet: Secondary | ICD-10-CM

## 2021-01-03 DIAGNOSIS — R262 Difficulty in walking, not elsewhere classified: Secondary | ICD-10-CM

## 2021-01-03 NOTE — Therapy (Signed)
[place] [facility] [address] [place], [address] Phone: [phone]   Fax:  [phone]  Physical Therapy No-Visit Discharge Summary Dates of reporting: 09/12/2020 - 01/03/2021  Patient Details  Name: Susan Salas MRN: [id number] Date of Birth: [birth year] Referring Provider (PT): Jennings Books, ND (neurology)   Encounter Date: 01/03/2021    Past Medical History:  Diagnosis Date  . Cancer ([place]) 2017   ovarian  . CHF (congestive heart failure) ([place])   . Complication of anesthesia    hard to wake from general anesthesia, long porcedures nausea and vomiting.  Marland Kitchen Hypertension   . Hypothyroidism   . PONV (postoperative nausea and vomiting)   . Spinal headache    with labor of 65 year old son.    Past Surgical History:  Procedure Laterality Date  . ABDOMINAL HYSTERECTOMY    . AMPUTATION TOE Left 05/02/2017   Procedure: AMPUTATION TOE/Left 4th mpj/28820;  Surgeon: Sharlotte Alamo, DPM;  Location: [facility] ORS;  Service: Podiatry;  Laterality: Left;  . APPENDECTOMY    . BACK SURGERY  2014   lamenectomy  . BREAST SURGERY Bilateral 2003   reduction  . CATARACT EXTRACTION W/ INTRAOCULAR LENS IMPLANT Bilateral 2014  . CHOLECYSTECTOMY    . COLON SURGERY  2017  . FRACTURE SURGERY Right 2003   screws & plates  . TONSILLECTOMY      There were no vitals filed for this visit.   Subjective Assessment - 01/03/21 1243    Subjective Patient called and reported MD reccomended she discontinue PT.    Patient is accompained by: Family member    Pertinent History Patient is a 85 y.o. female who presents to outpatient physical therapy with a referral for medical diagnosis lumbar radiculopathy. This patient's chief complaints consist of bilateral low back and leg pain, knee pain, difficulty with mobility, and fear of falling, leading to the following functional deficits: "limits me from doing anything," cannot make the bed, difficulty  walking, ADLs, IADLs, functional mobility. .  Relevant past medical history and comorbidities include ovarian cancer, basal cell cancer on back 05/2016, parkinson'onianism (dx in last year - states did not do well on parkinson's medication), restless leg syndrome, REM behavior disorder and insomnia, OSA, generalized sensory peripheral neuropathy,  HTN, hypothyroidism,  osteoporosis, congestive heart failure, essential tremor, back surgery (chart shows laminectomy, posterior lumbar facetectomy and foraminotmomy w/decomp n/a 04/19/2015), neck surgery (04/17/11 C3-7 DECOMP LAM AND LEFT SIDED PROXIMAL FORAMINOTOMIES), hx R ankle fracture surgery (pt reports instrumentation removed due to being allergic), exploratory laparotomy with removal of part of small intestine in 2017.  Patient denies hx of stroke, seizures, lung problem, major cardiac events, diabetes, unexplained weight loss, changes in bowel or bladder problems when pain started (does leak a lot - put bladder in sling). Endorses stumbling and dropping things.    Limitations Standing;Walking;Lifting;House hold activities    How long can you stand comfortably? 15-60 min before she has to sit down    How long can you walk comfortably? < 10 min before she must sit down    Diagnostic tests MRI report 07/31/20: "IMPRESSION:  1. Right paracentral and right foraminal disc protrusion at L3-4  contacting and likely irritating the right L3 nerve root.  2. Wide decompressive laminectomy at L2-3, L4-5 and L5-S1 but no  recurrent disc protrusion, spinal or foraminal stenosis at these  levels.  3. Mild right lateral recess and foraminal stenosis at L1-2."  NCS report from 06/05/2020: "  Impression:   This is an abnormal electrodiagnostic study consistent with a   generalized sensory polyneuropathy."    Patient Stated Goals "to be able to walk like I used to walk"           OBJECTIVE Patient is not present for examination at this time. Please see previous documentation  for latest objective data.      PT Short Term Goals - 01/03/21 1244      PT SHORT TERM GOAL #1   Title Be independent with initial home exercise program for self-management of symptoms.    Baseline to be initiated at visit 2 as appropriate (09/12/2020);    Time 3    Period Weeks    Status Partially Met    Target Date 10/03/20             PT Long Term Goals - 01/03/21 1244      PT LONG TERM GOAL #1   Title Be independent with a long-term home exercise program for self-management of symptoms.    Baseline initial HEP to be provided at visit 2 as appropriate (09/12/2020);    Time 12    Period Weeks    Status Partially Met   TARGET DATE FOR ALL LONG TERM GOALS: 12/05/2020     PT LONG TERM GOAL #2   Title Pt will improve ABC by at least 13% in order to demonstrate clinically significant improvement in balance confidence.    Baseline to be measures at visit 2 (09/12/2020); 23.1% (10/10/2020);    Time 12    Period Weeks    Status Unable to assess      PT LONG TERM GOAL #3   Title Demonstrate improved FOTO score by 10 units to demonstrate improvement in overall condition and self-reported functional ability.    Baseline to be assessed at visit 2 as appropriate (09/12/2020); 41 (10/10/2020);    Time 12    Period Weeks    Status Unable to assess      PT LONG TERM GOAL #4   Title Patient will improve time on Timed Get up and Go to equal or less than 15 seconds to demonstrate decreased fall risk and improved functional moblity.    Baseline 21 seconds with BUE support off chair and SPC (09/12/2020);    Time 12    Period Weeks    Status Unable to assess      PT LONG TERM GOAL #5   Title Pt will report less than 4/10 worst low back pain on NPRS in order to demonstrate clinically significant reduction in pain    Baseline 10+/10 worst (09/12/2020);    Time 12    Period Weeks    Status Not Met                 Plan - 01/03/21 1249    Clinical Impression Statement Patient attended 9  physical therapy sessions and struggled to make progress towards goals due to pain, difficulty getting to PT due to her and her husband's many other medical appointments. She demo improved mobility with rollator walker but continued to demonstrate high fall risk. Patient's MD recommended discontinuing PT just before her 10th visit which would have been a more formalized progress note with updated goals. Patient is now discharged due to MD recommendation.    Personal Factors and Comorbidities Age;Time since onset of injury/illness/exacerbation;Comorbidity 3+;Fitness;Past/Current Experience    Comorbidities Relevant past medical history and comorbidities include ovarian cancer, basal cell cancer on back  05/2016, parkinson'onianism (dx in last year - states did not do well on parkinson's medication), restless leg syndrome, REM behavior disorder and insomnia, OSA, generalized sensory peripheral neuropathy,  HTN, hypothyroidism,  osteoporosis, congestive heart failure, essential tremor, back surgery (chart shows laminectomy, posterior lumbar facetectomy and foraminotmomy w/decomp n/a 04/19/2015), neck surgery (04/17/11 C3-7 DECOMP LAM AND LEFT SIDED PROXIMAL FORAMINOTOMIES), hx R ankle fracture surgery (pt reports instrumentation removed due to being allergic), exploratory laparotomy with removal of part of small intestine in 2017.    Examination-Activity Limitations Stairs;Carry;Locomotion Level;Squat;Transfers;Stand;Bed Mobility;Caring for Others;Bend;Lift    Examination-Participation Restrictions Yard Work;Community Activity;Shop;Driving;Cleaning;Laundry;Meal Prep;Interpersonal Relationship    Stability/Clinical Decision Making Evolving/Moderate complexity    Rehab Potential Fair    PT Frequency 2x / week    PT Duration 12 weeks    PT Treatment/Interventions Patient/family education;Therapeutic exercise;Balance training;Therapeutic activities;Gait training;ADLs/Self Care Home Management;Moist Heat;DME  Instruction;Stair training;Functional mobility training;Passive range of motion;Dry needling;Joint Manipulations;Aquatic Therapy;Biofeedback;Canalith Repostioning;Cryotherapy;Neuromuscular re-education;Manual techniques;Vestibular;Electrical Stimulation;Spinal Manipulations    PT Next Visit Plan Patient is now discharged    PT [place] Access Code: [email or web address]    Consulted and Agree with Plan of Care Patient;Family member/caregiver    Family Member Consulted Husband, Fritz Pickerel           Patient will benefit from skilled therapeutic intervention in order to improve the following deficits and impairments:  Pain,Difficulty walking,Abnormal gait,Decreased balance,Decreased activity tolerance,Decreased strength,Decreased knowledge of use of DME,Impaired sensation,Decreased coordination,Decreased mobility,Decreased endurance,Decreased range of motion,Hypomobility,Impaired perceived functional ability,Obesity,Impaired flexibility  Visit Diagnosis: Chronic bilateral low back pain with bilateral sciatica  Muscle weakness (generalized)  Unsteadiness on feet  Difficulty in walking, not elsewhere classified  Other abnormalities of gait and mobility  Right knee pain, unspecified chronicity  Left knee pain, unspecified chronicity     Problem List There are no problems to display for this patient.   Everlean Alstrom. Graylon Good, PT, DPT 01/03/21, 12:50 PM   [place] [facility] [address] [place], [address] Phone: [phone]   Fax:  [phone]  Name: FEATHER BERRIE MRN: [id number] Date of Birth: 05-Apr-1936

## 2021-02-21 ENCOUNTER — Other Ambulatory Visit: Payer: Self-pay

## 2021-02-21 ENCOUNTER — Other Ambulatory Visit: Payer: Self-pay | Admitting: Physical Medicine & Rehabilitation

## 2021-02-21 ENCOUNTER — Ambulatory Visit
Admission: RE | Admit: 2021-02-21 | Discharge: 2021-02-21 | Disposition: A | Discharge status: Home / Self Care | Payer: Medicare Other | Source: Ambulatory Visit | Attending: Physical Medicine & Rehabilitation | Admitting: Physical Medicine & Rehabilitation

## 2021-02-21 DIAGNOSIS — M48062 Spinal stenosis, lumbar region with neurogenic claudication: Secondary | ICD-10-CM | POA: Diagnosis not present

## 2021-02-23 ENCOUNTER — Other Ambulatory Visit: Payer: Self-pay | Admitting: Orthopedic Surgery

## 2021-02-25 ENCOUNTER — Encounter: Payer: Self-pay | Admitting: Emergency Medicine

## 2021-02-25 ENCOUNTER — Other Ambulatory Visit: Payer: Self-pay

## 2021-02-25 ENCOUNTER — Inpatient Hospital Stay
Admission: EM | Admit: 2021-02-25 | Discharge: 2021-03-06 | DRG: 478 | Disposition: A | Discharge status: Skilled Nursing Facility | Payer: Medicare Other | Attending: Internal Medicine | Admitting: Internal Medicine

## 2021-02-25 DIAGNOSIS — Z89422 Acquired absence of other left toe(s): Secondary | ICD-10-CM

## 2021-02-25 DIAGNOSIS — M545 Low back pain, unspecified: Secondary | ICD-10-CM | POA: Diagnosis not present

## 2021-02-25 DIAGNOSIS — Z79899 Other long term (current) drug therapy: Secondary | ICD-10-CM

## 2021-02-25 DIAGNOSIS — S32040A Wedge compression fracture of fourth lumbar vertebra, initial encounter for closed fracture: Secondary | ICD-10-CM | POA: Diagnosis not present

## 2021-02-25 DIAGNOSIS — R410 Disorientation, unspecified: Secondary | ICD-10-CM | POA: Diagnosis present

## 2021-02-25 DIAGNOSIS — G20 Parkinson's disease: Secondary | ICD-10-CM

## 2021-02-25 DIAGNOSIS — Z87891 Personal history of nicotine dependence: Secondary | ICD-10-CM

## 2021-02-25 DIAGNOSIS — Z20822 Contact with and (suspected) exposure to covid-19: Secondary | ICD-10-CM | POA: Diagnosis present

## 2021-02-25 DIAGNOSIS — M810 Age-related osteoporosis without current pathological fracture: Secondary | ICD-10-CM | POA: Diagnosis present

## 2021-02-25 DIAGNOSIS — R400 Somnolence: Secondary | ICD-10-CM | POA: Diagnosis present

## 2021-02-25 DIAGNOSIS — R451 Restlessness and agitation: Secondary | ICD-10-CM | POA: Diagnosis present

## 2021-02-25 DIAGNOSIS — Z79890 Hormone replacement therapy (postmenopausal): Secondary | ICD-10-CM

## 2021-02-25 DIAGNOSIS — E669 Obesity, unspecified: Secondary | ICD-10-CM | POA: Diagnosis present

## 2021-02-25 DIAGNOSIS — Z885 Allergy status to narcotic agent status: Secondary | ICD-10-CM

## 2021-02-25 DIAGNOSIS — F419 Anxiety disorder, unspecified: Secondary | ICD-10-CM | POA: Diagnosis present

## 2021-02-25 DIAGNOSIS — I10 Essential (primary) hypertension: Secondary | ICD-10-CM | POA: Diagnosis present

## 2021-02-25 DIAGNOSIS — Z6830 Body mass index (BMI) 30.0-30.9, adult: Secondary | ICD-10-CM

## 2021-02-25 DIAGNOSIS — M62838 Other muscle spasm: Secondary | ICD-10-CM | POA: Diagnosis present

## 2021-02-25 DIAGNOSIS — R1031 Right lower quadrant pain: Secondary | ICD-10-CM

## 2021-02-25 DIAGNOSIS — G20C Parkinsonism, unspecified: Secondary | ICD-10-CM

## 2021-02-25 DIAGNOSIS — I5032 Chronic diastolic (congestive) heart failure: Secondary | ICD-10-CM | POA: Diagnosis present

## 2021-02-25 DIAGNOSIS — Z7982 Long term (current) use of aspirin: Secondary | ICD-10-CM

## 2021-02-25 DIAGNOSIS — Z419 Encounter for procedure for purposes other than remedying health state, unspecified: Secondary | ICD-10-CM

## 2021-02-25 DIAGNOSIS — M8008XA Age-related osteoporosis with current pathological fracture, vertebra(e), initial encounter for fracture: Secondary | ICD-10-CM | POA: Diagnosis present

## 2021-02-25 DIAGNOSIS — Z9049 Acquired absence of other specified parts of digestive tract: Secondary | ICD-10-CM

## 2021-02-25 DIAGNOSIS — W1830XA Fall on same level, unspecified, initial encounter: Secondary | ICD-10-CM | POA: Diagnosis present

## 2021-02-25 DIAGNOSIS — Z888 Allergy status to other drugs, medicaments and biological substances status: Secondary | ICD-10-CM

## 2021-02-25 DIAGNOSIS — E039 Hypothyroidism, unspecified: Secondary | ICD-10-CM | POA: Diagnosis present

## 2021-02-25 DIAGNOSIS — Z90710 Acquired absence of both cervix and uterus: Secondary | ICD-10-CM

## 2021-02-25 DIAGNOSIS — S32040G Wedge compression fracture of fourth lumbar vertebra, subsequent encounter for fracture with delayed healing: Secondary | ICD-10-CM

## 2021-02-25 DIAGNOSIS — Z6832 Body mass index (BMI) 32.0-32.9, adult: Secondary | ICD-10-CM

## 2021-02-25 DIAGNOSIS — M48061 Spinal stenosis, lumbar region without neurogenic claudication: Secondary | ICD-10-CM | POA: Diagnosis present

## 2021-02-25 DIAGNOSIS — Z882 Allergy status to sulfonamides status: Secondary | ICD-10-CM

## 2021-02-25 DIAGNOSIS — I110 Hypertensive heart disease with heart failure: Secondary | ICD-10-CM | POA: Diagnosis present

## 2021-02-25 LAB — COMPREHENSIVE METABOLIC PANEL
ALT: 27 U/L (ref 0–44)
AST: 21 U/L (ref 15–41)
Albumin: 4.4 g/dL (ref 3.5–5.0)
Alkaline Phosphatase: 113 U/L (ref 38–126)
Anion gap: 8 (ref 5–15)
BUN: 23 mg/dL (ref 8–23)
CO2: 31 mmol/L (ref 22–32)
Calcium: 9.9 mg/dL (ref 8.9–10.3)
Chloride: 101 mmol/L (ref 98–111)
Creatinine, Ser: 0.74 mg/dL (ref 0.44–1.00)
GFR, Estimated: >60 mL/min (ref >60)
Glucose, Bld: 120 mg/dL — ABNORMAL HIGH (ref 70–99)
Potassium: 3.8 mmol/L (ref 3.5–5.1)
Sodium: 140 mmol/L (ref 135–145)
Total Bilirubin: 1.1 mg/dL (ref 0.3–1.2)
Total Protein: 7.4 g/dL (ref 6.5–8.1)

## 2021-02-25 LAB — CBC WITH DIFFERENTIAL/PLATELET
Abs Immature Granulocytes: 0.03 10*3/uL (ref 0.00–0.07)
Basophils Absolute: 0 10*3/uL (ref 0.0–0.1)
Basophils Relative: 0 %
Eosinophils Absolute: 0.4 10*3/uL (ref 0.0–0.5)
Eosinophils Relative: 4 %
HCT: 44.9 % (ref 36.0–46.0)
Hemoglobin: 15.1 g/dL — ABNORMAL HIGH (ref 12.0–15.0)
Immature Granulocytes: 0 %
Lymphocytes Relative: 15 %
Lymphs Abs: 1.4 10*3/uL (ref 0.7–4.0)
MCH: 31.5 pg (ref 26.0–34.0)
MCHC: 33.6 g/dL (ref 30.0–36.0)
MCV: 93.5 fL (ref 80.0–100.0)
Monocytes Absolute: 1 10*3/uL (ref 0.1–1.0)
Monocytes Relative: 10 %
Neutro Abs: 6.5 10*3/uL (ref 1.7–7.7)
Neutrophils Relative %: 71 %
Platelets: 287 10*3/uL (ref 150–400)
RBC: 4.8 MIL/uL (ref 3.87–5.11)
RDW: 14.1 % (ref 11.5–15.5)
WBC: 9.2 10*3/uL (ref 4.0–10.5)
nRBC: 0 % (ref 0.0–0.2)

## 2021-02-25 LAB — LIPASE, BLOOD: Lipase: 25 U/L (ref 11–51)

## 2021-02-25 MED ORDER — LIDOCAINE 5 % EX PTCH
1.0000 | MEDICATED_PATCH | CUTANEOUS | Status: DC
  Administered 2021-02-26 – 2021-03-05 (×8): 1 via TRANSDERMAL
  Filled 2021-02-25 (×9): qty 1

## 2021-02-25 MED ORDER — MORPHINE SULFATE (PF) 4 MG/ML IV SOLN
4.0000 mg | Freq: Once | INTRAVENOUS | Status: AC
  Administered 2021-02-25: 4 mg via INTRAVENOUS
  Filled 2021-02-25: qty 1

## 2021-02-25 NOTE — ED Triage Notes (Signed)
Pt to ED via EMS, per son pt is scheduled for Kyphoplasty on 6/27. Per son pt was seen at [facility] last week. Pt's son reports that patient has been unable walk and due to pain. Pt's son reports that patient was asking to go to the hospital. Pt reports fell 2-3 weeks ago and was seen at [facility] after the fall, pt states known fracture in her back and that's why she is having surgery tomorrow at [facility].   Pt's son reports percocet at approx 1800, 2 ibuprofen and 2 tylenol PTA and fentanyl en route.   Pt denies loss of bowel/bladder, denies numbness/tingling.

## 2021-02-25 NOTE — ED Notes (Signed)
Per ems: pt from home with two falls in last two weeks. Per ems pt with L 4 fracture, scheduled for sugery tomorrow. Per ems pt has had unbearable pain, history of parkinson's.

## 2021-02-25 NOTE — ED Provider Notes (Signed)
Emergency Medicine Provider Triage Evaluation Note  Susan Salas , a 85 y.o. female  was evaluated in triage.  Pt complains of back pain wrapping to right abdomen. Pt has hx of fall with lumbar fracture and is scheduled for kyphoplasty tomorrow with Dr. Rudene Christians. Patient reports worsening of pain today wrapping into abdomen and caused her to present due to failing home medications including ibuprofen, tylenol and percocet. Denies new trauma or falls, denies any other symptoms. Reports last multiple recent bowel movements,   Review of Systems  Positive: Back pain wrapping to right abdomen Negative: Chest pain, shortness of breath, new trauma  Physical Exam  BP (!) 186/97 (BP Location: Right Arm)   Pulse 78   Temp 98.2 F (36.8 C) (Oral)   Resp 20   Ht 5\' 4"  (1.626 m)   Wt 90.3 kg   SpO2 98%   BMI 34.16 kg/m  Gen:   Awake, no distress   Resp:  Normal effort  MSK:   Moves extremities without difficulty Other:  Abd: mild ttp in RUQ and RLQ noted  Medical Decision Making  Medically screening exam initiated at 8:30 PM.  Appropriate orders placed.  BRITTNAE ASCHENBRENNER was informed that the remainder of the evaluation will be completed by another provider, this initial triage assessment does not replace that evaluation, and the importance of remaining in the ED until their evaluation is complete.     Marlana Salvage, PA 02/25/21 2039    Lucrezia Starch, MD 02/25/21 2249

## 2021-02-25 NOTE — ED Provider Notes (Signed)
[facility] Emergency Department Provider Note   ____________________________________________   Event Date/Time   First MD Initiated Contact with Patient 02/25/21 2258     (approximate)  I have reviewed the triage vital signs and the nursing notes.   HISTORY  Chief Complaint Back Pain    HPI Susan Salas is a 85 y.o. female with past medical history of hypertension, CHF, hypothyroidism, and parkinsonism who presents to the ED complaining of back pain.  Patient reports that he she had a fall 2 to 3 weeks ago, subsequently has been following with orthopedics for L4 compression fracture with 50% height loss.  She has been scheduled for kyphoplasty tomorrow morning but reports increasing pain over the past couple of days.  Pain is in her lower back and radiates around both sides of her back.  Pain is described as sharp and constant, worse with certain positions.  She complains of associated pain radiating down her right leg with chronic weakness in her right leg that is unchanged following her recent fall.  She denies any bowel or bladder incontinence.  She has been taking oxycodone for pain without significant relief, states pain has been so severe that she has been unable to walk for the past couple of days.        Past Medical History:  Diagnosis Date   Cancer ([place]) 2017   ovarian   CHF (congestive heart failure) (HCC)    Complication of anesthesia    hard to wake from general anesthesia, long porcedures nausea and vomiting.   Hypertension    Hypothyroidism    PONV (postoperative nausea and vomiting)    Spinal headache    with labor of 31 year old son.    There are no problems to display for this patient.   Past Surgical History:  Procedure Laterality Date   ABDOMINAL HYSTERECTOMY     AMPUTATION TOE Left 05/02/2017   Procedure: AMPUTATION TOE/Left 4th mpj/28820;  Surgeon: Sharlotte Alamo, DPM;  Location: [facility] ORS;  Service: Podiatry;  Laterality:  Left;   APPENDECTOMY     BACK SURGERY  2014   lamenectomy   BREAST SURGERY Bilateral 2003   reduction   CATARACT EXTRACTION W/ INTRAOCULAR LENS IMPLANT Bilateral 2014   CHOLECYSTECTOMY     COLON SURGERY  2017   FRACTURE SURGERY Right 2003   screws & plates   TONSILLECTOMY      Prior to Admission medications   Medication Sig Start Date End Date Taking? Authorizing Provider  acetaminophen (TYLENOL) 500 MG tablet Take 500-1,000 mg by mouth every 6 (six) hours as needed (for pain.).    [provider]  aspirin EC 81 MG tablet Take 81 mg by mouth at bedtime.    [provider]  COREG CR 10 MG 24 hr capsule Take 10 mg by mouth at bedtime. 02/23/17   [provider]  diclofenac sodium (VOLTAREN) 1 % GEL Apply 2-4 g topically daily as needed. For shoulder pain. 03/17/17   [provider]  DULoxetine (CYMBALTA) 60 MG capsule Take 60 mg by mouth daily.    [provider]  furosemide (LASIX) 40 MG tablet Take 40 mg by mouth daily.    [provider]  HYDROcodone-acetaminophen (NORCO/VICODIN) 5-325 MG tablet Take 1 tablet by mouth every 4 (four) hours as needed for moderate pain. 09/25/20 09/25/21  Blake Divine, MD  letrozole [facility]) 2.5 MG tablet Take 2.5 mg by mouth daily with breakfast. 03/25/17   [provider]  lidocaine (LIDODERM) 5 % Place 1 patch onto the skin every 12 (twelve) hours. Remove & Discard patch within 12 hours or as directed by MD 09/25/20 09/25/21  Blake Divine, MD  losartan (COZAAR) 50 MG tablet Take 50 mg by mouth daily.    [provider]  Multiple Vitamin (MULTIVITAMIN WITH MINERALS) TABS tablet Take 1 tablet by mouth daily. ONE-A-DAY    [provider]  Probiotic Product (PROBIOTIC PO) Take 1 capsule by mouth daily.    [provider]  SYNTHROID 88 MCG tablet Take 88 mcg by mouth daily before breakfast. 01/28/17   [provider]  VITAMIN E PO Take by mouth.    [provider]  zolpidem (AMBIEN) 5 MG tablet Take 2.5 mg by mouth at bedtime as needed for sleep.    [provider]    Allergies Other, Sulfa antibiotics, Ibandronic acid, Statins, Hydrochlorothiazide, Morphine, Ramipril, and Tape  History reviewed. No pertinent family history.  Social History Social History   Tobacco Use   Smoking status: Former    Pack years: 0.00    Types: Cigarettes    Quit date: 04/29/1983    Years since quitting: 37.8   Smokeless tobacco: Never  Vaping Use   Vaping Use: Never used  Substance Use Topics   Alcohol use: No   Drug use: No    Review of Systems  Constitutional: No fever/chills Eyes: No visual changes. ENT: No sore throat. Cardiovascular: Denies chest pain. Respiratory: Denies shortness of breath. Gastrointestinal: No abdominal pain.  No nausea, no vomiting.  No diarrhea.  No constipation. Genitourinary: Negative for dysuria. Musculoskeletal: Positive for back pain. Skin: Negative for rash. Neurological: Negative for headaches, focal weakness or numbness.  ____________________________________________   PHYSICAL EXAM:  VITAL SIGNS: ED Triage Vitals  Enc Vitals Group     BP 02/25/21 2021 (!) 186/97     Pulse Rate 02/25/21 2021 78     Resp 02/25/21 2021 20     Temp 02/25/21 2021 98.2 F (36.8 C)     Temp Source 02/25/21 2021 Oral     SpO2 02/25/21 2021 98 %     Weight 02/25/21 2022 199 lb (90.3 kg)     Height 02/25/21 2022 5\' 4"  (1.626 m)     Head Circumference --      Peak Flow --      Pain Score 02/25/21 2022 8     Pain Loc --      Pain Edu? --      Excl. in [place]? --     Constitutional: Alert and oriented. Eyes: Conjunctivae are normal. Head: Atraumatic. Nose: No congestion/rhinnorhea. Mouth/Throat: Mucous membranes are moist. Neck: Normal ROM Cardiovascular: Normal rate, regular rhythm. Grossly normal heart sounds.  2+ radial and DP pulses bilaterally. Respiratory: Normal respiratory effort.  No  retractions. Lungs CTAB. Gastrointestinal: Soft and nontender. No distention. Genitourinary: deferred Musculoskeletal: No lower extremity tenderness nor edema.  Midline lumbar spine tenderness to palpation. Neurologic:  Normal speech and language. No gross focal neurologic deficits are appreciated. Skin:  Skin is warm, dry and intact. No rash noted. Psychiatric: Mood and affect are normal. Speech and behavior are normal.  ____________________________________________   LABS (all labs ordered are listed, but only abnormal results are displayed)  Labs Reviewed  CBC WITH DIFFERENTIAL/PLATELET - Abnormal; Notable for the following components:      Result Value   Hemoglobin 15.1 (*)    All other components within normal limits  COMPREHENSIVE  METABOLIC PANEL - Abnormal; Notable for the following components:   Glucose, Bld 120 (*)    All other components within normal limits  RESP PANEL BY RT-PCR (FLU A&B, COVID) ARPGX2  LIPASE, BLOOD  URINALYSIS, COMPLETE (UACMP) WITH MICROSCOPIC    PROCEDURES  Procedure(s) performed (including Critical Care):  Procedures   ____________________________________________   INITIAL IMPRESSION / ASSESSMENT AND PLAN / ED COURSE      85 year old female with past medical history of hypertension, CHF, hypothyroidism, and parkinsonism who presents to the ED complaining of worsening back pain following recent diagnosis of lumbar compression fracture.  MRI from 4 days ago reviewed, which demonstrates her L4 compression fracture with mild retropulsion.  Patient additionally with severe right foraminal stenosis at L3-L4 which seems to be contributing to the pain radiating down her right leg.  Patient is having significant difficulty with pain control despite taking oxycodone at home, plan to discuss with orthopedics to determine whether she will require admission versus observation in the ED until kyphoplasty may be performed.  Labs are unremarkable, we will treat  pain with IV morphine.  Patient with mild allergic reaction to morphine, describes some itching with no visible rash or signs of anaphylaxis.  We will give dose of IV Benadryl and continue to monitor for any worsening allergic reaction.  Patient does report pain is improved following IV morphine.  Case discussed with Dr. Roland Rack of orthopedics, who agrees with plan for admission to hospitalist service for pain control, patient will be able to proceed with kyphoplasty later this morning as long as she remains n.p.o.  Case discussed with hospitalist for admission.      ____________________________________________   FINAL CLINICAL IMPRESSION(S) / ED DIAGNOSES  Final diagnoses:  Compression fracture of L4 vertebra with delayed healing, subsequent encounter     ED Discharge Orders     None        Note:  This document was prepared using Dragon voice recognition software and may include unintentional dictation errors.    Blake Divine, MD 02/26/21 [phone]

## 2021-02-25 NOTE — ED Notes (Signed)
Pt was wet, changed pt into blue scrubs, changed sheet on bed. Pt is currently in the middle in triage. Lab is attempting to get blood work. Husband is with Pt.

## 2021-02-26 ENCOUNTER — Inpatient Hospital Stay: Payer: Medicare Other

## 2021-02-26 ENCOUNTER — Encounter: Payer: Self-pay | Admitting: Internal Medicine

## 2021-02-26 ENCOUNTER — Ambulatory Visit: Admission: RE | Admit: 2021-02-26 | Payer: Medicare Other | Source: Home / Self Care | Admitting: Orthopedic Surgery

## 2021-02-26 ENCOUNTER — Inpatient Hospital Stay: Payer: Medicare Other | Admitting: Anesthesiology

## 2021-02-26 ENCOUNTER — Encounter
Admission: EM | Disposition: A | Discharge status: Skilled Nursing Facility | Payer: Self-pay | Source: Home / Self Care | Attending: Internal Medicine

## 2021-02-26 DIAGNOSIS — I10 Essential (primary) hypertension: Secondary | ICD-10-CM

## 2021-02-26 DIAGNOSIS — Z6832 Body mass index (BMI) 32.0-32.9, adult: Secondary | ICD-10-CM | POA: Diagnosis not present

## 2021-02-26 DIAGNOSIS — M545 Low back pain, unspecified: Secondary | ICD-10-CM | POA: Diagnosis present

## 2021-02-26 DIAGNOSIS — S32040A Wedge compression fracture of fourth lumbar vertebra, initial encounter for closed fracture: Principal | ICD-10-CM

## 2021-02-26 DIAGNOSIS — S32040G Wedge compression fracture of fourth lumbar vertebra, subsequent encounter for fracture with delayed healing: Secondary | ICD-10-CM | POA: Diagnosis not present

## 2021-02-26 DIAGNOSIS — M48061 Spinal stenosis, lumbar region without neurogenic claudication: Secondary | ICD-10-CM | POA: Diagnosis present

## 2021-02-26 DIAGNOSIS — E039 Hypothyroidism, unspecified: Secondary | ICD-10-CM | POA: Diagnosis present

## 2021-02-26 DIAGNOSIS — Z90710 Acquired absence of both cervix and uterus: Secondary | ICD-10-CM | POA: Diagnosis not present

## 2021-02-26 DIAGNOSIS — M62838 Other muscle spasm: Secondary | ICD-10-CM | POA: Diagnosis present

## 2021-02-26 DIAGNOSIS — G20 Parkinson's disease: Secondary | ICD-10-CM | POA: Diagnosis present

## 2021-02-26 DIAGNOSIS — Z87891 Personal history of nicotine dependence: Secondary | ICD-10-CM | POA: Diagnosis not present

## 2021-02-26 DIAGNOSIS — Z6830 Body mass index (BMI) 30.0-30.9, adult: Secondary | ICD-10-CM | POA: Diagnosis not present

## 2021-02-26 DIAGNOSIS — M8008XA Age-related osteoporosis with current pathological fracture, vertebra(e), initial encounter for fracture: Secondary | ICD-10-CM | POA: Diagnosis present

## 2021-02-26 DIAGNOSIS — R400 Somnolence: Secondary | ICD-10-CM | POA: Diagnosis present

## 2021-02-26 DIAGNOSIS — R410 Disorientation, unspecified: Secondary | ICD-10-CM | POA: Diagnosis present

## 2021-02-26 DIAGNOSIS — I5032 Chronic diastolic (congestive) heart failure: Secondary | ICD-10-CM | POA: Diagnosis present

## 2021-02-26 DIAGNOSIS — W1830XA Fall on same level, unspecified, initial encounter: Secondary | ICD-10-CM | POA: Diagnosis present

## 2021-02-26 DIAGNOSIS — Z882 Allergy status to sulfonamides status: Secondary | ICD-10-CM | POA: Diagnosis not present

## 2021-02-26 DIAGNOSIS — Z79899 Other long term (current) drug therapy: Secondary | ICD-10-CM | POA: Diagnosis not present

## 2021-02-26 DIAGNOSIS — Z9049 Acquired absence of other specified parts of digestive tract: Secondary | ICD-10-CM | POA: Diagnosis not present

## 2021-02-26 DIAGNOSIS — Z79890 Hormone replacement therapy (postmenopausal): Secondary | ICD-10-CM | POA: Diagnosis not present

## 2021-02-26 DIAGNOSIS — Z20822 Contact with and (suspected) exposure to covid-19: Secondary | ICD-10-CM | POA: Diagnosis present

## 2021-02-26 DIAGNOSIS — M810 Age-related osteoporosis without current pathological fracture: Secondary | ICD-10-CM | POA: Diagnosis present

## 2021-02-26 DIAGNOSIS — Z89422 Acquired absence of other left toe(s): Secondary | ICD-10-CM | POA: Diagnosis not present

## 2021-02-26 DIAGNOSIS — I110 Hypertensive heart disease with heart failure: Secondary | ICD-10-CM | POA: Diagnosis present

## 2021-02-26 DIAGNOSIS — Z888 Allergy status to other drugs, medicaments and biological substances status: Secondary | ICD-10-CM | POA: Diagnosis not present

## 2021-02-26 DIAGNOSIS — Z7982 Long term (current) use of aspirin: Secondary | ICD-10-CM | POA: Diagnosis not present

## 2021-02-26 DIAGNOSIS — Z885 Allergy status to narcotic agent status: Secondary | ICD-10-CM | POA: Diagnosis not present

## 2021-02-26 DIAGNOSIS — E669 Obesity, unspecified: Secondary | ICD-10-CM | POA: Diagnosis present

## 2021-02-26 HISTORY — PX: KYPHOPLASTY: SHX5884

## 2021-02-26 LAB — MAGNESIUM: Magnesium: 2.5 mg/dL — ABNORMAL HIGH (ref 1.7–2.4)

## 2021-02-26 LAB — RESP PANEL BY RT-PCR (FLU A&B, COVID) ARPGX2
Influenza A by PCR: NEGATIVE
Influenza B by PCR: NEGATIVE
SARS Coronavirus 2 by RT PCR: NEGATIVE

## 2021-02-26 LAB — CBC
HCT: 44.5 % (ref 36.0–46.0)
Hemoglobin: 14.8 g/dL (ref 12.0–15.0)
MCH: 30.8 pg (ref 26.0–34.0)
MCHC: 33.3 g/dL (ref 30.0–36.0)
MCV: 92.7 fL (ref 80.0–100.0)
Platelets: 315 10*3/uL (ref 150–400)
RBC: 4.8 MIL/uL (ref 3.87–5.11)
RDW: 13.8 % (ref 11.5–15.5)
WBC: 11.3 10*3/uL — ABNORMAL HIGH (ref 4.0–10.5)
nRBC: 0 % (ref 0.0–0.2)

## 2021-02-26 LAB — PROTIME-INR
INR: 1 (ref 0.8–1.2)
Prothrombin Time: 12.8 seconds (ref 11.4–15.2)

## 2021-02-26 LAB — COMPREHENSIVE METABOLIC PANEL
ALT: 32 U/L (ref 0–44)
AST: 28 U/L (ref 15–41)
Albumin: 4.3 g/dL (ref 3.5–5.0)
Alkaline Phosphatase: 113 U/L (ref 38–126)
Anion gap: 8 (ref 5–15)
BUN: 16 mg/dL (ref 8–23)
CO2: 29 mmol/L (ref 22–32)
Calcium: 9.9 mg/dL (ref 8.9–10.3)
Chloride: 106 mmol/L (ref 98–111)
Creatinine, Ser: 0.68 mg/dL (ref 0.44–1.00)
GFR, Estimated: >60 mL/min (ref >60)
Glucose, Bld: 110 mg/dL — ABNORMAL HIGH (ref 70–99)
Potassium: 3.6 mmol/L (ref 3.5–5.1)
Sodium: 143 mmol/L (ref 135–145)
Total Bilirubin: 1.4 mg/dL — ABNORMAL HIGH (ref 0.3–1.2)
Total Protein: 7.3 g/dL (ref 6.5–8.1)

## 2021-02-26 LAB — URINALYSIS, COMPLETE (UACMP) WITH MICROSCOPIC
Bilirubin Urine: NEGATIVE
Glucose, UA: NEGATIVE mg/dL
Hgb urine dipstick: NEGATIVE
Ketones, ur: NEGATIVE mg/dL
Leukocytes,Ua: NEGATIVE
Nitrite: NEGATIVE
Protein, ur: NEGATIVE mg/dL
Specific Gravity, Urine: 1.01 (ref 1.005–1.030)
pH: 7 (ref 5.0–8.0)

## 2021-02-26 LAB — VITAMIN D 25 HYDROXY (VIT D DEFICIENCY, FRACTURES): Vit D, 25-Hydroxy: 29.85 ng/mL — ABNORMAL LOW (ref 30–100)

## 2021-02-26 SURGERY — KYPHOPLASTY
Anesthesia: General

## 2021-02-26 MED ORDER — HYDROMORPHONE HCL 1 MG/ML IJ SOLN
1.0000 mg | INTRAMUSCULAR | Status: DC | PRN
  Administered 2021-02-26 – 2021-02-28 (×8): 1 mg via INTRAVENOUS
  Filled 2021-02-26 (×8): qty 1

## 2021-02-26 MED ORDER — ONDANSETRON HCL 4 MG/2ML IJ SOLN
4.0000 mg | Freq: Once | INTRAMUSCULAR | Status: AC
  Administered 2021-02-26: 4 mg via INTRAVENOUS
  Filled 2021-02-26: qty 2

## 2021-02-26 MED ORDER — LACTATED RINGERS IV SOLN: INTRAVENOUS | Status: DC | PRN

## 2021-02-26 MED ORDER — HYDROMORPHONE HCL 1 MG/ML IJ SOLN
0.5000 mg | Freq: Once | INTRAMUSCULAR | Status: AC
  Administered 2021-02-26: 0.5 mg via INTRAVENOUS
  Filled 2021-02-26: qty 1

## 2021-02-26 MED ORDER — DIPHENHYDRAMINE HCL 50 MG/ML IJ SOLN
25.0000 mg | Freq: Once | INTRAMUSCULAR | Status: AC
  Administered 2021-02-26: 25 mg via INTRAVENOUS
  Filled 2021-02-26: qty 1

## 2021-02-26 MED ORDER — DULOXETINE HCL 60 MG PO CPEP
60.0000 mg | ORAL_CAPSULE | Freq: Every day | ORAL | Status: DC
  Administered 2021-02-27 – 2021-03-06 (×8): 60 mg via ORAL
  Filled 2021-02-26 (×8): qty 1

## 2021-02-26 MED ORDER — HALOPERIDOL LACTATE 5 MG/ML IJ SOLN: 5.0000 mg | Freq: Once | INTRAMUSCULAR | Status: AC

## 2021-02-26 MED ORDER — FENTANYL CITRATE (PF) 100 MCG/2ML IJ SOLN
25.0000 ug | INTRAMUSCULAR | Status: DC | PRN
  Administered 2021-02-26 (×2): 25 ug via INTRAVENOUS
  Filled 2021-02-26 (×3): qty 2

## 2021-02-26 MED ORDER — LIDOCAINE HCL (PF) 1 % IJ SOLN
INTRAMUSCULAR | Status: AC
  Filled 2021-02-26: qty 30

## 2021-02-26 MED ORDER — CALCITONIN (SALMON) 200 UNIT/ACT NA SOLN
1.0000 | Freq: Every day | NASAL | Status: DC
  Administered 2021-03-02 – 2021-03-06 (×5): 1 via NASAL
  Filled 2021-02-26: qty 3.7

## 2021-02-26 MED ORDER — FENTANYL CITRATE (PF) 100 MCG/2ML IJ SOLN
50.0000 ug | INTRAMUSCULAR | Status: DC | PRN
Start: 2021-02-26 — End: 2021-02-27
  Administered 2021-02-27: 50 ug via INTRAVENOUS
  Filled 2021-02-26: qty 2

## 2021-02-26 MED ORDER — ROPINIROLE HCL 0.25 MG PO TABS
0.2500 mg | ORAL_TABLET | Freq: Three times a day (TID) | ORAL | Status: DC
  Administered 2021-02-26 – 2021-03-06 (×22): 0.25 mg via ORAL
  Filled 2021-02-26 (×28): qty 1

## 2021-02-26 MED ORDER — LABETALOL HCL 5 MG/ML IV SOLN
5.0000 mg | INTRAVENOUS | Status: DC | PRN
  Administered 2021-02-26: 5 mg via INTRAVENOUS

## 2021-02-26 MED ORDER — NALOXONE HCL 2 MG/2ML IJ SOSY
0.4000 mg | PREFILLED_SYRINGE | INTRAMUSCULAR | Status: DC | PRN
  Filled 2021-02-26: qty 2

## 2021-02-26 MED ORDER — LABETALOL HCL 5 MG/ML IV SOLN
INTRAVENOUS | Status: AC
  Administered 2021-02-26: 5 mg via INTRAVENOUS
  Filled 2021-02-26: qty 4

## 2021-02-26 MED ORDER — MIDAZOLAM HCL 2 MG/2ML IJ SOLN
INTRAMUSCULAR | Status: DC | PRN
  Administered 2021-02-26: 1 mg via INTRAVENOUS

## 2021-02-26 MED ORDER — ONDANSETRON HCL 4 MG/2ML IJ SOLN: 4.0000 mg | Freq: Four times a day (QID) | INTRAMUSCULAR | Status: DC | PRN

## 2021-02-26 MED ORDER — LIDOCAINE HCL 1 % IJ SOLN
INTRAMUSCULAR | Status: DC | PRN
  Administered 2021-02-26 (×2): 10 mL

## 2021-02-26 MED ORDER — HALOPERIDOL LACTATE 5 MG/ML IJ SOLN
INTRAMUSCULAR | Status: AC
  Administered 2021-02-26: 5 mg via INTRAVENOUS
  Filled 2021-02-26: qty 1

## 2021-02-26 MED ORDER — CHLORHEXIDINE GLUCONATE 0.12 % MT SOLN
OROMUCOSAL | Status: AC
  Administered 2021-02-26: 15 mL via OROMUCOSAL
  Filled 2021-02-26: qty 15

## 2021-02-26 MED ORDER — IOHEXOL 180 MG/ML  SOLN
INTRAMUSCULAR | Status: DC | PRN
  Administered 2021-02-26: 7 mL

## 2021-02-26 MED ORDER — BUPIVACAINE-EPINEPHRINE (PF) 0.5% -1:200000 IJ SOLN
INTRAMUSCULAR | Status: AC
  Filled 2021-02-26: qty 30

## 2021-02-26 MED ORDER — CHLORHEXIDINE GLUCONATE 0.12 % MT SOLN: 15.0000 mL | Freq: Once | OROMUCOSAL | Status: AC

## 2021-02-26 MED ORDER — ONDANSETRON HCL 4 MG/2ML IJ SOLN
INTRAMUSCULAR | Status: DC | PRN
  Administered 2021-02-26: 4 mg via INTRAVENOUS

## 2021-02-26 MED ORDER — ASPIRIN EC 81 MG PO TBEC
81.0000 mg | DELAYED_RELEASE_TABLET | Freq: Every day | ORAL | Status: DC
  Administered 2021-02-27 – 2021-03-06 (×8): 81 mg via ORAL
  Filled 2021-02-26 (×8): qty 1

## 2021-02-26 MED ORDER — ACETAMINOPHEN 10 MG/ML IV SOLN
INTRAVENOUS | Status: DC | PRN
  Administered 2021-02-26: 1000 mg via INTRAVENOUS

## 2021-02-26 MED ORDER — FENTANYL CITRATE (PF) 100 MCG/2ML IJ SOLN
25.0000 ug | Freq: Once | INTRAMUSCULAR | Status: DC
  Filled 2021-02-26: qty 2

## 2021-02-26 MED ORDER — ACETAMINOPHEN 325 MG PO TABS: 650.0000 mg | ORAL_TABLET | Freq: Four times a day (QID) | ORAL | Status: DC | PRN

## 2021-02-26 MED ORDER — LIDOCAINE HCL (PF) 1 % IJ SOLN
INTRAMUSCULAR | Status: AC
  Filled 2021-02-26: qty 60

## 2021-02-26 MED ORDER — CEFAZOLIN SODIUM-DEXTROSE 2-4 GM/100ML-% IV SOLN
2.0000 g | INTRAVENOUS | Status: AC
  Administered 2021-02-26: 2 g via INTRAVENOUS
  Filled 2021-02-26: qty 100

## 2021-02-26 MED ORDER — LOSARTAN POTASSIUM 25 MG PO TABS
25.0000 mg | ORAL_TABLET | Freq: Every day | ORAL | Status: DC
  Administered 2021-02-27 – 2021-03-06 (×8): 25 mg via ORAL
  Filled 2021-02-26 (×8): qty 1

## 2021-02-26 MED ORDER — BUPIVACAINE-EPINEPHRINE (PF) 0.5% -1:200000 IJ SOLN
INTRAMUSCULAR | Status: DC | PRN
  Administered 2021-02-26: 10 mL

## 2021-02-26 MED ORDER — CARVEDILOL PHOSPHATE ER 10 MG PO CP24
10.0000 mg | ORAL_CAPSULE | Freq: Every day | ORAL | Status: DC
  Administered 2021-02-26 – 2021-03-05 (×8): 10 mg via ORAL
  Filled 2021-02-26 (×10): qty 1

## 2021-02-26 MED ORDER — PROPOFOL 500 MG/50ML IV EMUL
INTRAVENOUS | Status: DC | PRN
  Administered 2021-02-26: 50 ug/kg/min via INTRAVENOUS

## 2021-02-26 MED ORDER — ACETAMINOPHEN 650 MG RE SUPP: 650.0000 mg | Freq: Four times a day (QID) | RECTAL | Status: DC | PRN

## 2021-02-26 MED ORDER — PROPOFOL 10 MG/ML IV BOLUS
INTRAVENOUS | Status: DC | PRN
  Administered 2021-02-26 (×4): 20 mg via INTRAVENOUS

## 2021-02-26 MED ORDER — CEFAZOLIN SODIUM-DEXTROSE 2-4 GM/100ML-% IV SOLN
INTRAVENOUS | Status: AC
  Filled 2021-02-26: qty 100

## 2021-02-26 SURGICAL SUPPLY — 22 items
CEMENT KYPHON CX01A KIT/MIXER (Cement) ×2 IMPLANT
COVER WAND RF STERILE (DRAPES) ×2 IMPLANT
DERMABOND ADVANCED (GAUZE/BANDAGES/DRESSINGS) ×1
DERMABOND ADVANCED .7 DNX12 (GAUZE/BANDAGES/DRESSINGS) ×1 IMPLANT
DEVICE BIOPSY BONE KYPHX (INSTRUMENTS) ×2 IMPLANT
DRAPE C-ARM XRAY 36X54 (DRAPES) ×2 IMPLANT
DRAPE U-SHAPE 47X51 STRL (DRAPES) ×2 IMPLANT
DURAPREP 26ML APPLICATOR (WOUND CARE) ×2 IMPLANT
GAUZE 4X4 16PLY ~~LOC~~+RFID DBL (SPONGE) ×2 IMPLANT
GLOVE SURG SYN 9.0  PF PI (GLOVE) ×1
GLOVE SURG SYN 9.0 PF PI (GLOVE) ×1 IMPLANT
GOWN SRG 2XL LVL 4 RGLN SLV (GOWNS) ×1 IMPLANT
GOWN STRL NON-REIN 2XL LVL4 (GOWNS) ×1
GOWN STRL REUS W/ TWL LRG LVL3 (GOWN DISPOSABLE) ×1 IMPLANT
GOWN STRL REUS W/TWL LRG LVL3 (GOWN DISPOSABLE) ×1
MANIFOLD NEPTUNE II (INSTRUMENTS) ×2 IMPLANT
PACK KYPHOPLASTY (MISCELLANEOUS) ×2 IMPLANT
RENTAL RFA GENERATOR (MISCELLANEOUS) IMPLANT
STRAP SAFETY 5IN WIDE (MISCELLANEOUS) ×2 IMPLANT
SWABSTK COMLB BENZOIN TINCTURE (MISCELLANEOUS) ×2 IMPLANT
TRAY KYPHOPAK 15/3 EXPRESS 1ST (MISCELLANEOUS) IMPLANT
TRAY KYPHOPAK 20/3 EXPRESS 1ST (MISCELLANEOUS) ×2 IMPLANT

## 2021-02-26 NOTE — ED Notes (Signed)
This RN attempted to call husband and son with no answer.

## 2021-02-26 NOTE — ED Notes (Signed)
RN gave report to [facility] RN

## 2021-02-26 NOTE — Progress Notes (Signed)
Patient came to the emergency room yesterday because of severe pain.  She does have spinal stenosis as well as the L4 compression fracture.  She is having predominantly back and leg pain.  I discussed this with her that we will hopefully get some resolution of the back pain and get her moving after surgery.  Left arm is marked for L4 kyphoplasty later today.

## 2021-02-26 NOTE — ED Notes (Signed)
Family at bedside. 

## 2021-02-26 NOTE — Progress Notes (Signed)
Brief note regarding plan, with full H&P to follow:  85 year old female who is being admitted for kyphoplasty in the setting of acute L4 compression fracture.  She experienced a ground-level mechanical fall as an outpatient few weeks ago, and has been experiencing severe low back pain in the interval, with suboptimal pain control in spite of as needed oxycodone as an outpatient over that interval.  MRI performed as an outpatient 4 days ago confirmed L4 compression fracture without evidence of significant central canal stenosis.  She denies any weakness of the lower extremities nor any saddle anesthesia.  Overall no red flag signs.  Case/imaging were discussed with on-call orthopedic surgeon, with plan for kyphoplasty later today.  NPO.  As needed IV fentanyl.    Babs Bertin, DO Hospitalist

## 2021-02-26 NOTE — ED Notes (Signed)
Lab at bedside

## 2021-02-26 NOTE — ED Notes (Signed)
Pt moaning and restless in bed.

## 2021-02-26 NOTE — H&P (Signed)
History and Physical    PLEASE NOTE THAT DRAGON DICTATION SOFTWARE WAS USED IN THE CONSTRUCTION OF THIS NOTE.   Susan Salas UQJ:[id number] DOB: 1936-02-10 DOA: 02/25/2021  PCP: Adin Hector, MD Patient coming from: home   I have personally briefly reviewed patient's old medical records in [place]  Chief Complaint: back pain  HPI: Susan Salas is a 85 y.o. female with medical history significant for chronic diastolic heart failure, essential pretension, acquired hypothyroidism, osteoporosis, who is admitted to [facility] on 02/25/2021 with L4 compression fracture after presenting from home to [facility] ED complaining of back pain.   The patient experienced a ground-level mechanical fall approximately 2 to 3 weeks ago when she tripped at home while attempting to ambulate.  She did not hit her head escapade of this fall, and denies any associated loss of consciousness.  She also denied any associated or immediately preceding chest pain, shortness of breath, nausea/vomiting, palpitations, diaphoresis, dizziness, presyncope, or syncope.  However, at the time of this fall, she notes near constant nonradiating low back discomfort, that worsens when standing as well as with ambulation.  Overall, over the last 2 weeks, the severity of this discomfort has intensified progressively, to the point where the patient is very limited in her ability to ambulate, purely on the basis of associated poor pain control as an outpatient in spite of compliance with as needed Percocet as well as Lidoderm patch in the interval.  She denies any associated acute lower extremity weakness, numbness, or paresthesias, and specifically denies any saddle anesthesia, urinary retention, or fecal incontinence.  In the setting of the progressive low back pain and associated diminished ambulatory abilities on the basis of poor pain control, she reports that she has been nearly 100% dependent upon her  husband, for completion of her ADLs.   The setting of progressive low back pain, she underwent outpatient MRI of the lumbar spine on 02/21/2021, which demonstrated L4 compression fracture without reported significant central canal stenosis.  She met with orthopedic surgery as an outpatient, who recommended kyphoplasty, and she was scheduled for this procedure to occur at [facility] on 02/26/2021.  However, in the setting of ongoing poor pain control as it relates to her low back discomfort, she presents to [facility] ED this evening for further evaluation and management.   Denies any subjective fever, chills, rigors, or generalized myalgias. Denies any recent headache, neck stiffness, rhinitis, rhinorrhea, sore throat, sob, wheezing, cough, nausea, vomiting, abdominal pain, diarrhea, or rash. No recent traveling or known COVID-19 exposures. Denies dysuria, gross hematuria, or change in urinary urgency/frequency.  Denies any recent chest pain, diaphoresis, or palpitations.  Also denies any recent orthopnea, PND, or worsening of peripheral edema.  She notes that she is typically on a daily baby aspirin as an outpatient, but has been holding this medication for the last several days in anticipation of kyphoplasty, as above.  Otherwise, she is not on any blood thinners at home.     ED Course:  Vital signs in the ED were notable for the following: Temperature max 98.2, heart rate 78-85; initial blood pressure 186/97, which is improved to 163/80 following improvement in pain control; respiratory rate 18-20, oxygen saturation 98 to 100% on room air.  Labs were notable for the following: CMP was notable for the following: Sodium 140, potassium 3.8, creatinine 0.70, and liver enzymes were found to be within normal limits.  CBC notable for white blood cell count of  9200, hemoglobin 15, platelets 287.  Urinalysis showed no white blood cells, nitrate negative, and leukocyte Estrace negative.  The EDP discussed the patient's  case with the on-call orthopedic surgeon, Dr. Rudene Christians, who recommends admission to the hospital service for further evaluation management of her presenting L4 compression fracture, with plan for formal orthopedic surgery consultation, and confirms plan to proceed with kyphoplasty at [facility] today (02/26/21).   While in the ED, the following were administered: Morphine 4 mg IV x1.     Review of Systems: As per HPI otherwise 10 point review of systems negative.   Past Medical History:  Diagnosis Date   Cancer ([place]) 2017   ovarian   CHF (congestive heart failure) (HCC)    Complication of anesthesia    hard to wake from general anesthesia, long porcedures nausea and vomiting.   Hypertension    Hypothyroidism    PONV (postoperative nausea and vomiting)    Spinal headache    with labor of 66 year old son.    Past Surgical History:  Procedure Laterality Date   ABDOMINAL HYSTERECTOMY     AMPUTATION TOE Left 05/02/2017   Procedure: AMPUTATION TOE/Left 4th [id number];  Surgeon: Sharlotte Alamo, DPM;  Location: [facility] ORS;  Service: Podiatry;  Laterality: Left;   APPENDECTOMY     BACK SURGERY  2014   lamenectomy   BREAST SURGERY Bilateral 2003   reduction   CATARACT EXTRACTION W/ INTRAOCULAR LENS IMPLANT Bilateral 2014   CHOLECYSTECTOMY     COLON SURGERY  2017   FRACTURE SURGERY Right 2003   screws & plates   TONSILLECTOMY      Social History:  reports that she quit smoking about 37 years ago. She has never used smokeless tobacco. She reports that she does not drink alcohol and does not use drugs.   Allergies  Allergen Reactions   Other Hives and Rash    "filler in all generic drugs"   Dissolvable stitches-do not dissolve   Sulfa Antibiotics Shortness Of Breath   Ibandronic Acid Other (See Comments)    (BONIVA) Muscle Pain   Statins Other (See Comments)    Muscle weakness   Hydrochlorothiazide Rash   Morphine Rash   Ramipril Rash   Tape Rash    Pt can tolerate paper tape.     History reviewed. No pertinent family history.  Family history reviewed and not pertinent    Prior to Admission medications   Medication Sig Start Date End Date Taking? Authorizing Provider  acetaminophen (TYLENOL) 500 MG tablet Take 500-1,000 mg by mouth every 6 (six) hours as needed (for pain.).    [provider]  aspirin EC 81 MG tablet Take 81 mg by mouth at bedtime.    [provider]  COREG CR 10 MG 24 hr capsule Take 10 mg by mouth at bedtime. 02/23/17   [provider]  diclofenac sodium (VOLTAREN) 1 % GEL Apply 2-4 g topically daily as needed. For shoulder pain. 03/17/17   [provider]  DULoxetine (CYMBALTA) 60 MG capsule Take 60 mg by mouth daily.    [provider]  furosemide (LASIX) 40 MG tablet Take 40 mg by mouth daily.    [provider]  HYDROcodone-acetaminophen (NORCO/VICODIN) 5-325 MG tablet Take 1 tablet by mouth every 4 (four) hours as needed for moderate pain. 09/25/20 09/25/21  Blake Divine, MD  letrozole [facility]) 2.5 MG tablet Take 2.5 mg by mouth daily with breakfast. 03/25/17   [provider]  lidocaine ([place])  5 % Place 1 patch onto the skin every 12 (twelve) hours. Remove & Discard patch within 12 hours or as directed by MD 09/25/20 09/25/21  Blake Divine, MD  losartan (COZAAR) 50 MG tablet Take 50 mg by mouth daily.    [provider]  Multiple Vitamin (MULTIVITAMIN WITH MINERALS) TABS tablet Take 1 tablet by mouth daily. ONE-A-DAY    [provider]  Probiotic Product (PROBIOTIC PO) Take 1 capsule by mouth daily.    [provider]  SYNTHROID 88 MCG tablet Take 88 mcg by mouth daily before breakfast. 01/28/17   [provider]  VITAMIN E PO Take by mouth.    [provider]  zolpidem (AMBIEN) 5 MG tablet Take 2.5 mg by mouth at bedtime as needed for sleep.    [provider]     Objective    Physical Exam: Vitals:   02/25/21 2021  02/25/21 2022 02/26/21 0016  BP: (!) 186/97  (!) 163/80  Pulse: 78  85  Resp: 20  18  Temp: 98.2 F (36.8 C)    TempSrc: Oral    SpO2: 98%  100%  Weight:  90.3 kg   Height:  5' 4" (1.626 m)     General: appears to be stated age; alert, oriented Skin: warm, dry, no rash Head:  AT/ Mouth:  Oral mucosa membranes appear moist, normal dentition Neck: supple; trachea midline Heart:  RRR; did not appreciate any M/R/G Lungs: CTAB, did not appreciate any wheezes, rales, or rhonchi Abdomen: + BS; soft, ND, NT Vascular: 2+ pedal pulses b/l; 2+ radial pulses b/l Extremities: no peripheral edema, no muscle wasting Neuro: sensation intact in upper and lower extremities b/l; strength intact in bilateral upper extremities; unable to fully assess strength of the bilateral lower extremities at this time in the setting of suboptimal pain control as it relates to her presenting L4 compression fracture.  No evidence of associated saddle anesthesia.     Labs on Admission: I have personally reviewed following labs and imaging studies  CBC: Recent Labs  Lab 02/25/21 2050  WBC 9.2  NEUTROABS 6.5  HGB 15.1*  HCT 44.9  MCV 93.5  PLT 458   Basic Metabolic Panel: Recent Labs  Lab 02/25/21 2050  NA 140  K 3.8  CL 101  CO2 31  GLUCOSE 120*  BUN 23  CREATININE 0.74  CALCIUM 9.9   GFR: Estimated Creatinine Clearance: 58 mL/min (by C-G formula based on SCr of 0.74 mg/dL). Liver Function Tests: Recent Labs  Lab 02/25/21 2050  AST 21  ALT 27  ALKPHOS 113  BILITOT 1.1  PROT 7.4  ALBUMIN 4.4   Recent Labs  Lab 02/25/21 2050  LIPASE 25   No results for input(s): AMMONIA in the last 168 hours. Coagulation Profile: No results for input(s): INR, PROTIME in the last 168 hours. Cardiac Enzymes: No results for input(s): CKTOTAL, CKMB, CKMBINDEX, TROPONINI in the last 168 hours. BNP (last 3 results) No results for input(s): PROBNP in the last 8760 hours. HbA1C: No results for  input(s): HGBA1C in the last 72 hours. CBG: No results for input(s): GLUCAP in the last 168 hours. Lipid Profile: No results for input(s): CHOL, HDL, LDLCALC, TRIG, CHOLHDL, LDLDIRECT in the last 72 hours. Thyroid Function Tests: No results for input(s): TSH, T4TOTAL, FREET4, T3FREE, THYROIDAB in the last 72 hours. Anemia Panel: No results for input(s): VITAMINB12, FOLATE, FERRITIN, TIBC, IRON, RETICCTPCT in the last 72 hours. Urine analysis:    Component Value  Date/Time   COLORURINE Colorless 12/31/2013 1542   APPEARANCEUR Clear 12/31/2013 1542   LABSPEC 1.004 12/31/2013 1542   PHURINE 7.0 12/31/2013 1542   GLUCOSEU Negative 12/31/2013 1542   HGBUR Negative 12/31/2013 1542   BILIRUBINUR Negative 12/31/2013 1542   KETONESUR Negative 12/31/2013 1542   PROTEINUR Negative 12/31/2013 1542   NITRITE Negative 12/31/2013 1542   LEUKOCYTESUR Negative 12/31/2013 1542    Radiological Exams on Admission: No results found.    Assessment/Plan   SERENITIE VINTON is a 85 y.o. female with medical history significant for chronic diastolic heart failure, essential pretension, acquired hypothyroidism, osteoporosis, who is admitted to [facility] on 02/25/2021 with L4 compression fracture after presenting from home to [facility] ED complaining of back pain.    Principal Problem:   Closed compression fracture of L4 lumbar vertebra, initial encounter ([place]) Active Problems:   Low back pain   Chronic diastolic CHF (congestive heart failure) (HCC)   Hypertension   Osteoporosis   Hypothyroidism     #) Acute L4 compression fracture: Stemming from ground-level mechanical fall without loss of consciousness or hitting head 2 to 3 weeks ago, with ensuing constant/progressive low back pain in the absence of red flag symptoms, with MRI lumbar spine on 02/21/2021 demonstrating L4 compression fracture without associated evidence of severe central canal stenosis.  Within the confines of  limited ability to evaluate strength of the bilateral lower extremities due to the presence of suboptimal pain control at this time, the bilateral lower extremities otherwise appear to be neurovascularly intact at this time, without any evidence of associated saddle anesthesia.  Case was discussed with the on-call orthopedic surgeon, Dr. Rudene Christians, who recommends admission to the hospitalist service for further evaluation management of her presenting L4 compression fracture, with plan for formal orthopedic surgery consultation, and confirms plan to proceed with kyphoplasty at [facility] today (02/26/21). Npo.  Patient is on baby aspirin at home, but has been holding this medication recently in anticipation of a plasty, as further detailed above.  Otherwise not on any blood thinners at home.  Lyndel Safe Score for this patient in the context of anticipated aforementioned orthopedic surgery conveys a 0.87% perioperative risk for significant cardiac event. No evidence to suggest acutely decompensated heart failure or acute MI. Consequently, no absolute contraindications to proceeding with proposed orthopedic surgery at this time.    Plan: Formal orthopedic surgery consult for definitive surgical management, with plan to take patient to the OR today for kyphoplasty . NPO after MN in anticipation of this procedure.  No pharmacologic anticoagulation leading up to this anticipated surgery. SCD's. Prn IV fentanyl. Anticipate postoperative PT consult. Check INR. Pre-op EKG has been ordered. Check 25-hydroxy vit D level.  Continue holding home aspirin.  Continue Lidoderm patch.       #) Chronic diastolic heart failure: Documented history of such, although I have been unable to find any prior echocardiogram results, or notes alluding to prior echocardiogram results via chart review, including review via care everywhere.  She does not appear to be on any scheduled diuretic medications as an outpatient, with her Lasix listed as as  needed only.  Outpatient cardiac regimen is also notable for Coreg and losartan.  No clinical evidence to suggest acute decompensated heart failure at this time.  Plan: Monitor strict I's and O's and daily weights.  In the setting of current n.p.o. status, will hold home Coreg and losartan, with plan for prioritizing postoperative resumption Coreg due to associated postoperative mortality benefit  of associated beta-blocker resumption.       #) Essential hypertension: Outpatient hypertensive regimen includes losartan and Coreg.  Mildly hypertensive in the ED this evening, although this is improving with further optimization of pain control as relates to her L4 compression fracture, as further detailed above.  Plan: Holding home and hypertensive medications for now in the context of current n.p.o. status due to anticipated kyphoplasty later today.  Close monitoring of ensuing blood pressure via routine vital signs.       #) Acquired hypothyroidism: On Synthroid as an outpatient.  Plan: Hold home Synthroid for now in the setting of current n.p.o. status.      #) Osteoporosis: Documented history of such, on Prolia injections as an outpatient.  Plan: In the setting of recent L4 compression fracture, will also check 25-hydroxy vitamin D level for further optimization.       DVT prophylaxis: scd's  Code Status: Full code Family Communication: none Disposition Plan: Per Rounding Team Consults called: on-call orthopedic surgeon consulted, as further described above;   Admission status: Inpatient; MedSurg     Of note, this patient was added by me to the following Admit List/Treatment Team: armcadmits.      PLEASE NOTE THAT DRAGON DICTATION SOFTWARE WAS USED IN THE CONSTRUCTION OF THIS NOTE.   [place] [facility] Pager [phone] From 6PM - 6AM  Otherwise, please contact night-coverage  [email or web address] Password [facility]   02/26/2021, 12:51 AM

## 2021-02-26 NOTE — Anesthesia Preprocedure Evaluation (Signed)
Anesthesia Evaluation  Patient identified by MRN, date of birth, ID band Patient awake    Reviewed: Allergy & Precautions, NPO status , Patient's Chart, lab work & pertinent test results  History of Anesthesia Complications (+) PONV, POST - OP SPINAL HEADACHE and history of anesthetic complications  Airway Mallampati: II       Dental  (+) Upper Dentures, Dental Advidsory Given   Pulmonary neg sleep apnea, neg COPD, former smoker,           Cardiovascular hypertension, Pt. on medications and Pt. on home beta blockers (-) angina+CHF  (-) Past MI (-) dysrhythmias (-) Valvular Problems/Murmurs     Neuro/Psych neg Seizures    GI/Hepatic Neg liver ROS, neg GERD  ,  Endo/Other  Hypothyroidism   Renal/GU negative Renal ROS     Musculoskeletal   Abdominal   Peds  Hematology   Anesthesia Other Findings Past Medical History: 2017: Cancer ([place])     Comment:  ovarian No date: CHF (congestive heart failure) ([place]) No date: Complication of anesthesia     Comment:  hard to wake from general anesthesia, long porcedures               nausea and vomiting. No date: Hypertension No date: Hypothyroidism No date: PONV (postoperative nausea and vomiting) No date: Spinal headache     Comment:  with labor of 46 year old son.   Reproductive/Obstetrics                             Anesthesia Physical  Anesthesia Plan  ASA: 3  Anesthesia Plan: General   Post-op Pain Management:    Induction: Intravenous  PONV Risk Score and Plan: 3 and Propofol infusion and TIVA  Airway Management Planned: Simple Face Mask and Natural Airway  Additional Equipment:   Intra-op Plan:   Post-operative Plan:   Informed Consent: I have reviewed the patients History and Physical, chart, labs and discussed the procedure including the risks, benefits and alternatives for the proposed anesthesia with the patient or  authorized representative who has indicated his/her understanding and acceptance.       Plan Discussed with:   Anesthesia Plan Comments:         Anesthesia Quick Evaluation

## 2021-02-26 NOTE — Transfer of Care (Addendum)
Immediate Anesthesia Transfer of Care Note  Patient: Susan Salas  Procedure(s) Performed: L4 KYPHOPLASTY  Patient Location: PACU  Anesthesia Type:General  Level of Consciousness: awake, drowsy and patient cooperative  Airway & Oxygen Therapy: Patient Spontanous Breathing; Nasal Canula 3LPM.  Post-op Assessment: Report given to RN and Post -op Vital signs reviewed and stable  Post vital signs: Reviewed and stable  Last Vitals:  Vitals Value Taken Time  BP 185/91 02/26/21 1330  Temp 36.4 C 02/26/21 1317  Pulse 86 02/26/21 1332  Resp 16 02/26/21 1317  SpO2 100 % 02/26/21 1332  Vitals shown include unvalidated device data.  Last Pain:  Vitals:   02/26/21 1141  TempSrc: Oral  PainSc: 6        Dr Rosey Bath called to PACU d/t elevated diastolic BP.    Complications: No notable events documented.

## 2021-02-26 NOTE — Op Note (Signed)
02/26/2021  1:15 PM  PATIENT:  Susan Salas   MRN: [id number]   PRE-OPERATIVE DIAGNOSIS:  closed wedge compression fracture of L4   POST-OPERATIVE DIAGNOSIS:  closed wedge compression fracture of L4   PROCEDURE:  Procedure(s): KYPHOPLASTY L4  SURGEON: Laurene Footman, MD   ASSISTANTS: None   ANESTHESIA:   local and MAC   EBL:  No intake/output data recorded.   BLOOD ADMINISTERED:none   DRAINS: none    LOCAL MEDICATIONS USED:  MARCAINE    and XYLOCAINE    SPECIMEN: L4 vertebral body biopsy   DISPOSITION OF SPECIMEN: Pathology   COUNTS:  YES   TOURNIQUET:  * No tourniquets in log *   IMPLANTS: Bone cement   DICTATION: .Dragon Dictation  patient was brought to the operating room and after adequate anesthesia was obtained the patient was placed prone.  C arm was brought in in good visualization of the affected level obtained on both AP and lateral projections.  After patient identification and timeout procedures were completed, local anesthetic was infiltrated with 10 cc 1% Xylocaine infiltrated subcutaneously.  This is done the area on the each side of the planned approach.  The back was then prepped and draped in the usual sterile manner and repeat timeout procedure carried out.  A spinal needle was brought down to the pedicle on the right side of L4 and a 50-50 mix of 1% Xylocaine half percent Sensorcaine with epinephrine total of 20 cc injected on the right side.  After allowing this to set a small incision was made and the trocar was advanced into the vertebral body in an extrapedicular fashion.  Biopsy was obtained and biopsy across the midline so second stick was not required.  Drilling was carried out balloon inserted with inflation to 5 cc on the right  When the cement was appropriate consistency 6.5 cc were injected on the right into  the vertebral body with some extravasation to the left from the vertebral body but not at all posterior towards the nerve roots., good  fill superior to inferior endplates and from right to left sides along the inferior endplate.  After the cement had set the trochar was removed and permanent C-arm views obtained.  The wound was closed with Dermabond followed by Band-Aid   PLAN OF CARE: Continue as inpatient   PATIENT DISPOSITION:  PACU - hemodynamically stable.

## 2021-02-26 NOTE — Progress Notes (Addendum)
PROGRESS NOTE    Susan Salas  SAY:[id number] DOB: 10/20/1935 DOA: 02/25/2021 PCP: Adin Hector, MD  Outpatient Specialists: PM & R    Brief Narrative:   Hx HTN, parkinson's, back pain, presenting for pain control and kyphoplasty 2/2 subacute L4 compression fracture. Mechanical fall 2-3 weeks ago.    Assessment & Plan:   Principal Problem:   Closed compression fracture of L4 lumbar vertebra, initial encounter (HCC) Active Problems:   Low back pain   Chronic diastolic CHF (congestive heart failure) ([place])   Hypertension   Osteoporosis   Hypothyroidism   Parkinsonism ([place])   # L4 Compression fracture # Osteoporosis Evaluated by PM & R and ortho as outpatient, original outpatient plan was kyphoplasty today with orthopedics. Patient presented to ED yesterday 2/2 pain. MRI on 6/22 w/o signs cord compression. - for kyphoplasty later today w/ orthopedics - pain control hydromorphone IV while npo, plan to transition to orals and oral bowel regimen after surgery. Also starting calcitonin - npo for now - pt/ot consults after surgery, may need SNF - is on prolia as outpt  # hypothyroid - home synthroid  # HTN - home losartan  # Parkinsonism - home requip  DVT prophylaxis: SCDs for now Code Status: full Family Communication:  none @ bedside  Level of care: Med-Surg Status is: Inpatient  Remains inpatient appropriate because:Inpatient level of care appropriate due to severity of illness  Dispo: The patient is from: Home              Anticipated d/c is to:  tbd              Patient currently is not medically stable to d/c.   Difficult to place patient No    Consultants:  orthopedics  Procedures: none  Antimicrobials:  none    Subjective: This morning low back pain improved with IV pain meds  Objective: Vitals:   02/26/21 0540 02/26/21 0700 02/26/21 0730 02/26/21 0830  BP: (!) 156/86 135/81 (!) 136/94 (!) 166/93  Pulse: 98  95 92  Resp: 20 14 16  14   Temp:      TempSrc:      SpO2: 96%  99% 99%  Weight:      Height:       No intake or output data in the 24 hours ending 02/26/21 0859 Filed Weights   02/25/21 2022  Weight: 90.3 kg    Examination:  General exam: Appears calm and comfortable  Respiratory system: Clear to auscultation. Respiratory effort normal. Cardiovascular system: S1 & S2 heard, RRR. No JVD, murmurs, rubs, gallops or clicks. No pedal edema. Gastrointestinal system: Abdomen is obese, soft and nontender. No organomegaly or masses felt. Normal bowel sounds heard. Central nervous system: Alert and oriented. No focal neurological deficits. Extremities: Symmetric 5 x 5 power dorsiflexion and plantarflexion Skin: No rashes, lesions or ulcers Psychiatry: Judgement and insight appear normal. Mood & affect appropriate.     Data Reviewed: I have personally reviewed following labs and imaging studies  CBC: Recent Labs  Lab 02/25/21 2050 02/26/21 0739  WBC 9.2 11.3*  NEUTROABS 6.5  --   HGB 15.1* 14.8  HCT 44.9 44.5  MCV 93.5 92.7  PLT 287 235   Basic Metabolic Panel: Recent Labs  Lab 02/25/21 2050 02/26/21 0739  NA 140 143  K 3.8 3.6  CL 101 106  CO2 31 29  GLUCOSE 120* 110*  BUN 23 16  CREATININE 0.74 0.68  CALCIUM 9.9 9.9  MG  --  2.5*   GFR: Estimated Creatinine Clearance: 58 mL/min (by C-G formula based on SCr of 0.68 mg/dL). Liver Function Tests: Recent Labs  Lab 02/25/21 2050 02/26/21 0739  AST 21 28  ALT 27 32  ALKPHOS 113 113  BILITOT 1.1 1.4*  PROT 7.4 7.3  ALBUMIN 4.4 4.3   Recent Labs  Lab 02/25/21 2050  LIPASE 25   No results for input(s): AMMONIA in the last 168 hours. Coagulation Profile: No results for input(s): INR, PROTIME in the last 168 hours. Cardiac Enzymes: No results for input(s): CKTOTAL, CKMB, CKMBINDEX, TROPONINI in the last 168 hours. BNP (last 3 results) No results for input(s): PROBNP in the last 8760 hours. HbA1C: No results for input(s): HGBA1C  in the last 72 hours. CBG: No results for input(s): GLUCAP in the last 168 hours. Lipid Profile: No results for input(s): CHOL, HDL, LDLCALC, TRIG, CHOLHDL, LDLDIRECT in the last 72 hours. Thyroid Function Tests: No results for input(s): TSH, T4TOTAL, FREET4, T3FREE, THYROIDAB in the last 72 hours. Anemia Panel: No results for input(s): VITAMINB12, FOLATE, FERRITIN, TIBC, IRON, RETICCTPCT in the last 72 hours. Urine analysis:    Component Value Date/Time   COLORURINE YELLOW (A) 02/26/2021 0222   APPEARANCEUR HAZY (A) 02/26/2021 0222   APPEARANCEUR Clear 12/31/2013 1542   LABSPEC 1.010 02/26/2021 0222   LABSPEC 1.004 12/31/2013 1542   PHURINE 7.0 02/26/2021 0222   GLUCOSEU NEGATIVE 02/26/2021 0222   GLUCOSEU Negative 12/31/2013 1542   HGBUR NEGATIVE 02/26/2021 0222   BILIRUBINUR NEGATIVE 02/26/2021 0222   BILIRUBINUR Negative 12/31/2013 [place] 02/26/2021 0222   PROTEINUR NEGATIVE 02/26/2021 0222   NITRITE NEGATIVE 02/26/2021 0222   LEUKOCYTESUR NEGATIVE 02/26/2021 0222   LEUKOCYTESUR Negative 12/31/2013 1542   Sepsis Labs: @LABRCNTIP (procalcitonin:4,lacticidven:4)  ) Recent Results (from the past 240 hour(s))  Resp Panel by RT-PCR (Flu A&B, Covid)     Status: None   Collection Time: 02/26/21  2:22 AM   Specimen: Nasopharyngeal(NP) swabs in vial transport medium  Result Value Ref Range Status   SARS Coronavirus 2 by RT PCR NEGATIVE NEGATIVE Final    Comment: (NOTE) SARS-CoV-2 target nucleic acids are NOT DETECTED.  The SARS-CoV-2 RNA is generally detectable in upper respiratory specimens during the acute phase of infection. The lowest concentration of SARS-CoV-2 viral copies this assay can detect is 138 copies/mL. A negative result does not preclude SARS-Cov-2 infection and should not be used as the sole basis for treatment or other patient management decisions. A negative result may occur with  improper specimen collection/handling, submission of  specimen other than nasopharyngeal swab, presence of viral mutation(s) within the areas targeted by this assay, and inadequate number of viral copies(<138 copies/mL). A negative result must be combined with clinical observations, patient history, and epidemiological information. The expected result is Negative.  Fact Sheet for Patients:  EntrepreneurPulse.com.au  Fact Sheet for Healthcare Providers:  IncredibleEmployment.be  This test is no t yet approved or cleared by the [place] FDA and  has been authorized for detection and/or diagnosis of SARS-CoV-2 by FDA under an Emergency Use Authorization (EUA). This EUA will remain  in effect (meaning this test can be used) for the duration of the COVID-19 declaration under Section 564(b)(1) of the Act, 21 U.S.C.section 360bbb-3(b)(1), unless the authorization is terminated  or revoked sooner.       Influenza A by PCR NEGATIVE NEGATIVE Final   Influenza B by PCR NEGATIVE NEGATIVE Final    Comment: (NOTE)  The Xpert Xpress SARS-CoV-2/FLU/RSV plus assay is intended as an aid in the diagnosis of influenza from Nasopharyngeal swab specimens and should not be used as a sole basis for treatment. Nasal washings and aspirates are unacceptable for Xpert Xpress SARS-CoV-2/FLU/RSV testing.  Fact Sheet for Patients: EntrepreneurPulse.com.au  Fact Sheet for Healthcare Providers: IncredibleEmployment.be  This test is not yet approved or cleared by the [place] FDA and has been authorized for detection and/or diagnosis of SARS-CoV-2 by FDA under an Emergency Use Authorization (EUA). This EUA will remain in effect (meaning this test can be used) for the duration of the COVID-19 declaration under Section 564(b)(1) of the Act, 21 U.S.C. section 360bbb-3(b)(1), unless the authorization is terminated or revoked.  Performed at [facility], [address] [place], [place] [address]          Radiology Studies: No results found.      Scheduled Meds:  lidocaine  1 patch Transdermal Q24H   Continuous Infusions:   ceFAZolin (ANCEF) IV       LOS: 0 days    Time spent: 35 min    Desma Maxim, MD [facility]   If 7PM-7AM, please contact night-coverage [email or web address] Password [facility] 02/26/2021, 8:59 AM

## 2021-02-26 NOTE — ED Notes (Signed)
Pt with 1:1 sitter present for pt safety.

## 2021-02-26 NOTE — ED Notes (Signed)
Pt trying to get out of bed. EDT and RN at bedside. Pt removed brief and purewick and soiled bed linens.

## 2021-02-27 ENCOUNTER — Inpatient Hospital Stay: Payer: Medicare Other

## 2021-02-27 ENCOUNTER — Encounter: Payer: Self-pay | Admitting: Orthopedic Surgery

## 2021-02-27 LAB — CBC
HCT: 42.3 % (ref 36.0–46.0)
Hemoglobin: 13.9 g/dL (ref 12.0–15.0)
MCH: 30.5 pg (ref 26.0–34.0)
MCHC: 32.9 g/dL (ref 30.0–36.0)
MCV: 93 fL (ref 80.0–100.0)
Platelets: 283 10*3/uL (ref 150–400)
RBC: 4.55 MIL/uL (ref 3.87–5.11)
RDW: 13.9 % (ref 11.5–15.5)
WBC: 9 10*3/uL (ref 4.0–10.5)
nRBC: 0 % (ref 0.0–0.2)

## 2021-02-27 LAB — BASIC METABOLIC PANEL
Anion gap: 8 (ref 5–15)
BUN: 14 mg/dL (ref 8–23)
CO2: 30 mmol/L (ref 22–32)
Calcium: 9.6 mg/dL (ref 8.9–10.3)
Chloride: 104 mmol/L (ref 98–111)
Creatinine, Ser: 0.62 mg/dL (ref 0.44–1.00)
GFR, Estimated: >60 mL/min (ref >60)
Glucose, Bld: 117 mg/dL — ABNORMAL HIGH (ref 70–99)
Potassium: 3.5 mmol/L (ref 3.5–5.1)
Sodium: 142 mmol/L (ref 135–145)

## 2021-02-27 LAB — SURGICAL PATHOLOGY

## 2021-02-27 MED ORDER — HYDRALAZINE HCL 20 MG/ML IJ SOLN
10.0000 mg | INTRAMUSCULAR | Status: DC | PRN
  Administered 2021-03-06: 10 mg via INTRAVENOUS
  Filled 2021-02-27: qty 1

## 2021-02-27 MED ORDER — ENOXAPARIN SODIUM 60 MG/0.6ML IJ SOSY
0.5000 mg/kg | PREFILLED_SYRINGE | INTRAMUSCULAR | Status: DC
  Administered 2021-02-27 – 2021-03-03 (×5): 45 mg via SUBCUTANEOUS
  Filled 2021-02-27 (×5): qty 0.6

## 2021-02-27 MED ORDER — ENOXAPARIN SODIUM 40 MG/0.4ML IJ SOSY: 40.0000 mg | PREFILLED_SYRINGE | INTRAMUSCULAR | Status: DC

## 2021-02-27 MED ORDER — ADULT MULTIVITAMIN W/MINERALS CH
1.0000 | ORAL_TABLET | Freq: Every day | ORAL | Status: DC
  Administered 2021-02-27 – 2021-03-06 (×8): 1 via ORAL
  Filled 2021-02-27 (×8): qty 1

## 2021-02-27 MED ORDER — OXYCODONE HCL 5 MG PO TABS
5.0000 mg | ORAL_TABLET | ORAL | Status: DC | PRN
  Administered 2021-02-27 – 2021-03-02 (×5): 5 mg via ORAL
  Filled 2021-02-27 (×6): qty 1

## 2021-02-27 MED ORDER — ENSURE ENLIVE PO LIQD
237.0000 mL | Freq: Two times a day (BID) | ORAL | Status: DC
  Administered 2021-02-27 – 2021-03-05 (×10): 237 mL via ORAL

## 2021-02-27 MED ORDER — TRAMADOL HCL 50 MG PO TABS
50.0000 mg | ORAL_TABLET | Freq: Two times a day (BID) | ORAL | Status: DC
  Administered 2021-02-27 – 2021-02-28 (×3): 50 mg via ORAL
  Filled 2021-02-27 (×3): qty 1

## 2021-02-27 NOTE — NC FL2 (Signed)
[place] LEVEL OF CARE SCREENING TOOL     IDENTIFICATION  Patient Name: Susan Salas Birthdate: 1936-03-02 Sex: female Admission Date (Current Location): 02/25/2021  [facility] and [place] Number:  Engineering geologist and Address:  [facility], [address] [place], [place] [address]      Provider Number: [id number]  Attending Physician Name and Address:  Gwynne Edinger, MD  Relative Name and Phone Number:  Yevonne Aline [phone]    Current Level of Care: Hospital Recommended Level of Care: [place] Prior Approval Number:    Date Approved/Denied:   PASRR Number: [id number]  Discharge Plan: SNF    Current Diagnoses: Patient Active Problem List   Diagnosis Date Noted   Closed compression fracture of L4 lumbar vertebra, initial encounter ([place]) 02/26/2021   Low back pain 02/26/2021   Chronic diastolic CHF (congestive heart failure) ([place]) 02/26/2021   Parkinsonism ([place]) 02/26/2021   Hypertension    Osteoporosis    Hypothyroidism    OSA (obstructive sleep apnea) 03/17/2019   Amputated toe of left foot ([place]) 05/29/2017   Lumbar stenosis with neurogenic claudication 04/10/2015   Bowel perforation ([place]) 01/11/2014    Orientation RESPIRATION BLADDER Height & Weight     Self, Time, Situation, Place  Normal Continent Weight: 90.3 kg Height:  5\' 4"  (162.6 cm)  BEHAVIORAL SYMPTOMS/MOOD NEUROLOGICAL BOWEL NUTRITION STATUS      Continent Diet (regular)  AMBULATORY STATUS COMMUNICATION OF NEEDS Skin   Extensive Assist Verbally Normal, Surgical wounds                       Personal Care Assistance Level of Assistance  Bathing, Dressing Bathing Assistance: Limited assistance   Dressing Assistance: Limited assistance     Functional Limitations Info             SPECIAL CARE FACTORS FREQUENCY  PT (By licensed PT)     PT Frequency: 5 times per week              Contractures Contractures  Info: Not present    Additional Factors Info  Code Status, Allergies Code Status Info: Full code Allergies Info: Statins, Sulfa Antibiotics, Sulfasalazine, Ibandronic Acid, Sertraline, Hydrochlorothiazide, Lidocaine, Morphine, Ramipril, Tape           Current Medications (02/27/2021):  This is the current hospital active medication list Current Facility-Administered Medications  Medication Dose Route Frequency Provider Last Rate Last Admin   acetaminophen (TYLENOL) tablet 650 mg  650 mg Oral Q6H PRN Hessie Knows, MD       Or   acetaminophen (TYLENOL) suppository 650 mg  650 mg Rectal Q6H PRN Hessie Knows, MD       aspirin EC tablet 81 mg  81 mg Oral Daily Hessie Knows, MD   81 mg at 02/27/21 1142   calcitonin (salmon) (MIACALCIN/FORTICAL) nasal spray 1 spray  1 spray Alternating Nares Daily Wouk, Ailene Rud, MD       carvedilol (COREG CR) 24 hr capsule 10 mg  10 mg Oral QHS Hessie Knows, MD   10 mg at 02/26/21 2231   DULoxetine (CYMBALTA) DR capsule 60 mg  60 mg Oral Daily Hessie Knows, MD   60 mg at 02/27/21 0750   enoxaparin (LOVENOX) injection 45 mg  0.5 mg/kg Subcutaneous Q24H Wouk, Ailene Rud, MD       feeding supplement (ENSURE ENLIVE / ENSURE PLUS) liquid 237 mL  237 mL Oral BID BM Wouk,  Ailene Rud, MD   237 mL at 02/27/21 1309   hydrALAZINE (APRESOLINE) injection 10 mg  10 mg Intravenous Q2H PRN Wouk, Ailene Rud, MD       HYDROmorphone (DILAUDID) injection 1 mg  1 mg Intravenous Q3H PRN Hessie Knows, MD   1 mg at 02/27/21 1143   lidocaine (LIDODERM) 5 % 1 patch  1 patch Transdermal Q24H Hessie Knows, MD   1 patch at 02/27/21 0121   losartan (COZAAR) tablet 25 mg  25 mg Oral Daily Hessie Knows, MD   25 mg at 02/27/21 0749   multivitamin with minerals tablet 1 tablet  1 tablet Oral Daily Gwynne Edinger, MD   1 tablet at 02/27/21 1309   naloxone [facility]) injection 0.4 mg  0.4 mg Intravenous PRN Hessie Knows, MD       ondansetron [facility]) injection 4 mg  4 mg  Intravenous Q6H PRN Hessie Knows, MD       oxyCODONE (Oxy IR/ROXICODONE) immediate release tablet 5 mg  5 mg Oral Q4H PRN Gwynne Edinger, MD   5 mg at 02/27/21 1515   rOPINIRole (REQUIP) tablet 0.25 mg  0.25 mg Oral TID Hessie Knows, MD   0.25 mg at 02/27/21 1515   traMADol (ULTRAM) tablet 50 mg  50 mg Oral Q12H Gwynne Edinger, MD   50 mg at 02/27/21 1309     Discharge Medications: Please see discharge summary for a list of discharge medications.  Relevant Imaging Results:  Relevant Lab Results:   Additional Information SS# [id number]  Jameis Newsham Jen Mow, RN

## 2021-02-27 NOTE — TOC Progression Note (Signed)
Transition of Care [facility]) - Progression Note    Patient Details  Name: Susan Salas MRN: [id number] Date of Birth: [birth year]  [facility]) CM/SW [place], RN Phone Number: 02/27/2021, 3:37 PM  Clinical Narrative:      Met with the patient and she is agreeable to go to SNF, Bedsearch was sent, PASSR obtained, Fl2 Completed, awaiting bed offers       Expected Discharge Plan and Services                                                 Social Determinants of Health (SDOH) Interventions    Readmission Risk Interventions No flowsheet data found.

## 2021-02-27 NOTE — Evaluation (Signed)
Physical Therapy Evaluation Patient Details Name: Susan Salas MRN: [id number] DOB: 05/21/1936 Today's Date: 02/27/2021   History of Present Illness  85 y/o female who had a fall ~3 weeks ago, now s/p L4 kyphoplasty 6/27.   Medical history significant for chronic diastolic heart failure, essential pretension, acquired hypothyroidism, osteoporosis, Parkinsonin like symptoms.  Clinical Impression  Pt groaning in pain on arrival, does indicate that she expects to be able to do very little.  Did coordinate with nursing before session to receive pain meds.  Once we talked and PT was able to encourage participation she was able to do better than she or this PT expected given her initial commends, concerns and recent limitations.  She did have LE weakness (R weaker than L) with testing but was generally functional and though she needed some assist with bed mobility and (more so) during transition to standing.  She did have some inconsistent R knee buckling with walking/WBing that did require occasional assist to maintain safety.  Pt was able to slowly and cautiously ambulate ~25 ft but again had pain and safety concerns t/o the effort.  Pt will benefit from continued PT to address these mobility/safety issues.  Expect the safest and best d/c disposition is for rehab.      Follow Up Recommendations SNF    Equipment Recommendations  None recommended by PT    Recommendations for Other Services       Precautions / Restrictions Precautions Precautions: Fall Restrictions Weight Bearing Restrictions: No      Mobility  Bed Mobility Overal bed mobility: Needs Assistance Bed Mobility: Sidelying to Sit;Rolling Rolling: Min assist Sidelying to sit: Min assist       General bed mobility comments: Pt needed some light assist to get R knee bent to assit with rolling, however she did most of the movement w/o direct assist.  Similarly needed only light assist with transition sidelying to sit     Transfers Overall transfer level: Needs assistance Equipment used: Rolling walker (2 wheeled) Transfers: Sit to/from Stand Sit to Stand: Min assist;Mod assist         General transfer comment: Cues for set up and hand placement, AD use.  She struggled to initiate upward movement, did need some assist t/o the transition to fully standing.  Ambulation/Gait Ambulation/Gait assistance: Min assist Gait Distance (Feet): 25 Feet Assistive device: Rolling walker (2 wheeled)       General Gait Details: Pt with very slow, halting gait with heavy walker reliance.  She did have a few episodes of R knee buckling needing some assist to arrest possible LOBs.  She was able to lengthen step length with VCs but quickly reverted to Parkinsonian shuffling when self selecting.  Pt c/o vague pain t/o the effort w/o excessive/limiting pain.  Stairs            Wheelchair Mobility    Modified Rankin (Stroke Patients Only)       Balance Overall balance assessment: Needs assistance Sitting-balance support: Single extremity supported Sitting balance-Leahy Scale: Good Sitting balance - Comments: c/o pain, but able to maintain sitting balance w/o assist     Standing balance-Leahy Scale: Fair Standing balance comment: Pt highly reliant on AD, had a few buckling episodes needing assist to maintain standing.  Able to improve safety with consistent cuing.                             Pertinent Vitals/Pain Pain  Assessment: 0-10 Pain Score: 6  Pain Location: low back, groaning in pain on arrival and talks of how limiting it is, however ultimately moved relatively well - did organize with nursing giving meds before PT session    [place] expects to be discharged to:: [place]: Spouse/significant other     Home Access: Level entry     Home Layout: One level Home Equipment: Walker - 4 wheels;Bedside commode;Shower seat;Grab bars  - toilet      Prior Function Level of Independence: Independent with assistive device(s)         Comments: Pt has been more limited over the last few weeks 2/2 pain, apparently with 4WW has been able to manage in the home with minimal OOH activity.     Hand Dominance        Extremity/Trunk Assessment   Upper Extremity Assessment Upper Extremity Assessment: Generalized weakness    Lower Extremity Assessment Lower Extremity Assessment: Generalized weakness (R LE grossly 3+/5, L grossly 4-/5 - pain hesitancy with all movement)       Communication   Communication: No difficulties  Cognition Arousal/Alertness: Awake/alert Behavior During Therapy: WFL for tasks assessed/performed Overall Cognitive Status: Within Functional Limits for tasks assessed                                        General Comments      Exercises     Assessment/Plan    PT Assessment Patient needs continued PT services  PT Problem List Decreased strength;Decreased range of motion;Decreased activity tolerance;Decreased balance;Decreased mobility;Decreased knowledge of use of DME;Decreased safety awareness;Pain       PT Treatment Interventions DME instruction;Gait training;Stair training;Functional mobility training;Therapeutic activities;Therapeutic exercise;Balance training;Patient/family education    PT Goals (Current goals can be found in the Care Plan section)  Acute Rehab PT Goals Patient Stated Goal: control pain PT Goal Formulation: With patient Time For Goal Achievement: 03/13/21 Potential to Achieve Goals: Fair    Frequency 7X/week   Barriers to discharge        Co-evaluation               AM-PAC PT "6 Clicks" Mobility  Outcome Measure Help needed turning from your back to your side while in a flat bed without using bedrails?: A Little Help needed moving from lying on your back to sitting on the side of a flat bed without using bedrails?: A Little Help  needed moving to and from a bed to a chair (including a wheelchair)?: A Little Help needed standing up from a chair using your arms (e.g., wheelchair or bedside chair)?: A Little Help needed to walk in hospital room?: A Lot Help needed climbing 3-5 steps with a railing? : A Lot 6 Click Score: 16    End of Session Equipment Utilized During Treatment: Gait belt Activity Tolerance: Patient limited by pain;Patient limited by fatigue Patient left: with chair alarm set;with call bell/phone within reach;with nursing/sitter in room;with family/visitor present Nurse Communication: Mobility status PT Visit Diagnosis: Unsteadiness on feet (R26.81);Muscle weakness (generalized) (M62.81);Difficulty in walking, not elsewhere classified (R26.2);Pain Pain - part of body:  (lumbago)    Time: 1610-9604 PT Time Calculation (min) (ACUTE ONLY): 44 min   Charges:   PT Evaluation $PT Eval Low Complexity: 1 Low PT Treatments $Gait Training: 8-22 mins $Therapeutic Activity: 8-22 mins  Kreg Shropshire, DPT 02/27/2021, 10:51 AM

## 2021-02-27 NOTE — Progress Notes (Signed)
PHARMACIST - PHYSICIAN COMMUNICATION  CONCERNING:  Enoxaparin (Lovenox) for DVT Prophylaxis    RECOMMENDATION: Patient was prescribed enoxaprin 40mg  q24 hours for VTE prophylaxis.   Filed Weights   02/25/21 2022  Weight: 90.3 kg (199 lb)    Body mass index is 34.16 kg/m.  Estimated Creatinine Clearance: 58 mL/min (by C-G formula based on SCr of 0.62 mg/dL).   Based on [place] patient is candidate for enoxaparin 0.5mg /kg TBW SQ every 24 hours based on BMI being >30.  Patient is candidate for enoxaparin 30mg  every 24 hours based on CrCl <73ml/min or Weight <45kg  DESCRIPTION: Pharmacy has adjusted enoxaparin dose per [facility] policy.  Patient is now receiving enoxaparin 45 mg every 24 hours    Berta Minor, PharmD Clinical Pharmacist  02/27/2021 12:25 PM

## 2021-02-27 NOTE — Progress Notes (Addendum)
PROGRESS NOTE    Susan Salas  LFY:[id number] DOB: Mar 12, 1936 DOA: 02/25/2021 PCP: Adin Hector, MD  Outpatient Specialists: PM & R    Brief Narrative:   Hx HTN, parkinson's, back pain, presenting for pain control and kyphoplasty 2/2 subacute L4 compression fracture. Mechanical fall 2-3 weeks ago.    Assessment & Plan:   Principal Problem:   Closed compression fracture of L4 lumbar vertebra, initial encounter [facility]) Active Problems:   Low back pain   Chronic diastolic CHF (congestive heart failure) ([place])   Hypertension   Osteoporosis   Hypothyroidism   Parkinsonism ([place])   # L4 Compression fracture # Osteoporosis MRI on 6/22 w/o signs cord compression. S/p L4 kyphoplasty on 6/27 - pain control tramadol standing, oxycodone prn, hydromorphone prn. Have also started Also starting calcitonin - pt/ot advising snf, bed search underway - is on prolia as outpt  # Right groin pain No abnormality on physical exam - f/u u/s  # left facial numbness This afternoon complained of sensation of numbness left side of face lateral to the nose. Normal neuro exam. - monitor  # Delirium 2/2 pain, procedure, narcotics. improving - sitter at bedside.   # hypothyroid - home synthroid  # HTN - home losartan  # Parkinsonism - home requip  DVT prophylaxis: lovenox Code Status: full Family Communication:  husband updated @ bedside 6/28 Level of care: Med-Surg Status is: Inpatient  Remains inpatient appropriate because:Inpatient level of care appropriate due to severity of illness  Dispo: The patient is from: Home              Anticipated d/c is to: snf              Patient currently is not medically stable to d/c.   Difficult to place patient No    Consultants:  orthopedics  Procedures: none  Antimicrobials:  none    Subjective: This morning low back pain persists, also with right groin pain  Objective: Vitals:   02/27/21 0742 02/27/21 0754 02/27/21 0756  02/27/21 1126  BP:  (!) 148/84 (!) 148/84 109/64  Pulse: 76  69 75  Resp: 18  18 16   Temp:   98.2 F (36.8 C) 97.9 F (36.6 C)  TempSrc:   Oral   SpO2: 100%  97% 99%  Weight:      Height:        Intake/Output Summary (Last 24 hours) at 02/27/2021 1208 Last data filed at 02/27/2021 1036 Gross per 24 hour  Intake 980 ml  Output 901 ml  Net 79 ml   Filed Weights   02/25/21 2022  Weight: 90.3 kg    Examination:  General exam: Appears in mild pain, NAD Respiratory system: Clear to auscultation. Respiratory effort normal. Cardiovascular system: S1 & S2 heard, RRR. No JVD, murmurs, rubs, gallops or clicks. No pedal edema. Gastrointestinal system: Abdomen is obese, soft and nontender. No organomegaly or masses felt. Normal bowel sounds heard. No right groin lesion or mass Central nervous system: Alert and oriented. No focal neurological deficits. Cn 2-12 grossly intact. 5/5 upper and lower strength Extremities: warm Skin: No rashes, lesions or ulcers Psychiatry: Judgement and insight appear normal. Mood & affect appropriate.     Data Reviewed: I have personally reviewed following labs and imaging studies  CBC: Recent Labs  Lab 02/25/21 2050 02/26/21 0739 02/27/21 0507  WBC 9.2 11.3* 9.0  NEUTROABS 6.5  --   --   HGB 15.1* 14.8 13.9  HCT 44.9  44.5 42.3  MCV 93.5 92.7 93.0  PLT 287 315 962   Basic Metabolic Panel: Recent Labs  Lab 02/25/21 2050 02/26/21 0739 02/27/21 0507  NA 140 143 142  K 3.8 3.6 3.5  CL 101 106 104  CO2 31 29 30   GLUCOSE 120* 110* 117*  BUN 23 16 14   CREATININE 0.74 0.68 0.62  CALCIUM 9.9 9.9 9.6  MG  --  2.5*  --    GFR: Estimated Creatinine Clearance: 58 mL/min (by C-G formula based on SCr of 0.62 mg/dL). Liver Function Tests: Recent Labs  Lab 02/25/21 2050 02/26/21 0739  AST 21 28  ALT 27 32  ALKPHOS 113 113  BILITOT 1.1 1.4*  PROT 7.4 7.3  ALBUMIN 4.4 4.3   Recent Labs  Lab 02/25/21 2050  LIPASE 25   No results for  input(s): AMMONIA in the last 168 hours. Coagulation Profile: Recent Labs  Lab 02/26/21 0739  INR 1.0   Cardiac Enzymes: No results for input(s): CKTOTAL, CKMB, CKMBINDEX, TROPONINI in the last 168 hours. BNP (last 3 results) No results for input(s): PROBNP in the last 8760 hours. HbA1C: No results for input(s): HGBA1C in the last 72 hours. CBG: No results for input(s): GLUCAP in the last 168 hours. Lipid Profile: No results for input(s): CHOL, HDL, LDLCALC, TRIG, CHOLHDL, LDLDIRECT in the last 72 hours. Thyroid Function Tests: No results for input(s): TSH, T4TOTAL, FREET4, T3FREE, THYROIDAB in the last 72 hours. Anemia Panel: No results for input(s): VITAMINB12, FOLATE, FERRITIN, TIBC, IRON, RETICCTPCT in the last 72 hours. Urine analysis:    Component Value Date/Time   COLORURINE YELLOW (A) 02/26/2021 0222   APPEARANCEUR HAZY (A) 02/26/2021 0222   APPEARANCEUR Clear 12/31/2013 1542   LABSPEC 1.010 02/26/2021 0222   LABSPEC 1.004 12/31/2013 1542   PHURINE 7.0 02/26/2021 0222   GLUCOSEU NEGATIVE 02/26/2021 0222   GLUCOSEU Negative 12/31/2013 1542   HGBUR NEGATIVE 02/26/2021 0222   BILIRUBINUR NEGATIVE 02/26/2021 0222   BILIRUBINUR Negative 12/31/2013 [place] 02/26/2021 0222   PROTEINUR NEGATIVE 02/26/2021 0222   NITRITE NEGATIVE 02/26/2021 0222   LEUKOCYTESUR NEGATIVE 02/26/2021 0222   LEUKOCYTESUR Negative 12/31/2013 1542   Sepsis Labs: @LABRCNTIP (procalcitonin:4,lacticidven:4)  ) Recent Results (from the past 240 hour(s))  Resp Panel by RT-PCR (Flu A&B, Covid)     Status: None   Collection Time: 02/26/21  2:22 AM   Specimen: Nasopharyngeal(NP) swabs in vial transport medium  Result Value Ref Range Status   SARS Coronavirus 2 by RT PCR NEGATIVE NEGATIVE Final    Comment: (NOTE) SARS-CoV-2 target nucleic acids are NOT DETECTED.  The SARS-CoV-2 RNA is generally detectable in upper respiratory specimens during the acute phase of infection. The  lowest concentration of SARS-CoV-2 viral copies this assay can detect is 138 copies/mL. A negative result does not preclude SARS-Cov-2 infection and should not be used as the sole basis for treatment or other patient management decisions. A negative result may occur with  improper specimen collection/handling, submission of specimen other than nasopharyngeal swab, presence of viral mutation(s) within the areas targeted by this assay, and inadequate number of viral copies(<138 copies/mL). A negative result must be combined with clinical observations, patient history, and epidemiological information. The expected result is Negative.  Fact Sheet for Patients:  EntrepreneurPulse.com.au  Fact Sheet for Healthcare Providers:  IncredibleEmployment.be  This test is no t yet approved or cleared by the [place] FDA and  has been authorized for detection and/or diagnosis of SARS-CoV-2 by FDA  under an Emergency Use Authorization (EUA). This EUA will remain  in effect (meaning this test can be used) for the duration of the COVID-19 declaration under Section 564(b)(1) of the Act, 21 U.S.C.section 360bbb-3(b)(1), unless the authorization is terminated  or revoked sooner.       Influenza A by PCR NEGATIVE NEGATIVE Final   Influenza B by PCR NEGATIVE NEGATIVE Final    Comment: (NOTE) The Xpert Xpress SARS-CoV-2/FLU/RSV plus assay is intended as an aid in the diagnosis of influenza from Nasopharyngeal swab specimens and should not be used as a sole basis for treatment. Nasal washings and aspirates are unacceptable for Xpert Xpress SARS-CoV-2/FLU/RSV testing.  Fact Sheet for Patients: EntrepreneurPulse.com.au  Fact Sheet for Healthcare Providers: IncredibleEmployment.be  This test is not yet approved or cleared by the [place] FDA and has been authorized for detection and/or diagnosis of SARS-CoV-2 by FDA under  an Emergency Use Authorization (EUA). This EUA will remain in effect (meaning this test can be used) for the duration of the COVID-19 declaration under Section 564(b)(1) of the Act, 21 U.S.C. section 360bbb-3(b)(1), unless the authorization is terminated or revoked.  Performed at [facility], [address] [place], [place] [address]          Radiology Studies: DG Lumbar Spine 2-3 Views  Result Date: 02/26/2021 CLINICAL DATA:  Surgery, elective. Additional history provided: Kypho lumbar. Provided fluoroscopy time 1 minutes, 30 seconds. EXAM: LUMBAR SPINE - 2-3 VIEW; DG C-ARM 1-60 MIN COMPARISON:  Lumbar spine MRI 02/21/2021. FINDINGS: Seven intraprocedural fluoroscopic images of the lumbar spine are submitted (oblique and lateral projection). The lowest well-formed intervertebral disc space is designated L5-S1. On the provided images, a trocar extends into the L4 lumbar vertebra (site of known compression fracture). Kyphoplasty material is present within the L4 vertebra. On the later acquired images, some of the kyphoplasty material extends beyond the margins of the L4 vertebra (laterally and towards the L4-L5 disc space). IMPRESSION: Seven intraprocedural fluoroscopic images of the lumbar spine from L4 kyphoplasty, as described. Electronically Signed   By: Kellie Simmering DO   On: 02/26/2021 14:36   DG C-Arm 1-60 Min  Result Date: 02/26/2021 CLINICAL DATA:  Surgery, elective. Additional history provided: Kypho lumbar. Provided fluoroscopy time 1 minutes, 30 seconds. EXAM: LUMBAR SPINE - 2-3 VIEW; DG C-ARM 1-60 MIN COMPARISON:  Lumbar spine MRI 02/21/2021. FINDINGS: Seven intraprocedural fluoroscopic images of the lumbar spine are submitted (oblique and lateral projection). The lowest well-formed intervertebral disc space is designated L5-S1. On the provided images, a trocar extends into the L4 lumbar vertebra (site of known compression fracture). Kyphoplasty material is present within  the L4 vertebra. On the later acquired images, some of the kyphoplasty material extends beyond the margins of the L4 vertebra (laterally and towards the L4-L5 disc space). IMPRESSION: Seven intraprocedural fluoroscopic images of the lumbar spine from L4 kyphoplasty, as described. Electronically Signed   By: Kellie Simmering DO   On: 02/26/2021 14:36        Scheduled Meds:  aspirin EC  81 mg Oral Daily   calcitonin (salmon)  1 spray Alternating Nares Daily   carvedilol  10 mg Oral QHS   DULoxetine  60 mg Oral Daily   feeding supplement  237 mL Oral BID BM   fentaNYL (SUBLIMAZE) injection  25 mcg Intravenous Once   lidocaine  1 patch Transdermal Q24H   losartan  25 mg Oral Daily   multivitamin with minerals  1 tablet Oral Daily  rOPINIRole  0.25 mg Oral TID   Continuous Infusions:     LOS: 1 day    Time spent: 25 min    Desma Maxim, MD [facility]   If 7PM-7AM, please contact night-coverage [email or web address] Password [facility] 02/27/2021, 12:08 PM

## 2021-02-27 NOTE — Evaluation (Signed)
Occupational Therapy Evaluation Patient Details Name: Susan Salas MRN: [id number] DOB: 1935-11-08 Today's Date: 02/27/2021    History of Present Illness 85 y/o female who had a fall ~3 weeks ago, now s/p L4 kyphoplasty 6/27.   Medical history significant for chronic diastolic heart failure, essential pretension, acquired hypothyroidism, osteoporosis, Parkinsonin like symptoms.   Clinical Impression   Susan Salas was seen for OT evaluation this date, POD#1 from L4 kyphoplasty. Prior to hospital admission, pt received assist from her spouse for some BADL management including assist with LB dressing. Pt reports that since her fall, she has been much more limited with IADL and leisure tasks. Pt is eager to get back to playing cards and gardening. Pt educated in back precautions for comfort, self care skills, bed mobility and functional transfer training, AE/DME for bathing, dressing, and toileting needs, and home/routines modifications and falls prevention strategies to maximize safety and functional independence while minimizing falls risk and maintaining precautions. Pt currently requires MOD Assist for LB ADL management including bathing and dressing as well as close CGA for functional mobility.  Pt would benefit from skilled OT services to address noted impairments and functional limitations (see below for any additional details) in order to maximize safety and independence while minimizing falls risk and caregiver burden. Upon hospital discharge, STR to maximize pt safety and return to PLOF.      Follow Up Recommendations  SNF;Supervision - Intermittent    Equipment Recommendations  3 in 1 bedside commode    Recommendations for Other Services       Precautions / Restrictions Precautions Precautions: Fall Restrictions Weight Bearing Restrictions: No      Mobility Bed Mobility Overal bed mobility: Needs Assistance Bed Mobility: Sidelying to Sit;Rolling Rolling: Min assist Sidelying  to sit: Min assist            Transfers Overall transfer level: Needs assistance Equipment used: Rolling walker (2 wheeled) Transfers: Sit to/from Stand Sit to Stand: Min assist;+2 physical assistance         General transfer comment: Cues for set up and hand placement, AD use.  Close CGA provided t/o session with +2 CGA for functional mobility for pt comfort.    Balance Overall balance assessment: Needs assistance Sitting-balance support: Feet supported;No upper extremity supported Sitting balance-Susan Salas Scale: Good Sitting balance - Comments: Steady static sitting, reaching within BOS.     Standing balance-Susan Salas Scale: Fair Standing balance comment: Pt highly reliant on AD, had a few buckling episodes needing assist to maintain standing.  Able to improve safety with consistent cuing.                           ADL either performed or assessed with clinical judgement   ADL Overall ADL's : Needs assistance/impaired                         Toilet Transfer: BSC;Supervision/safety;+2 for safety/equipment;Min guard;RW   Toileting- Clothing Manipulation and Hygiene: Sitting/lateral lean;Set up;Supervision/safety Toileting - Clothing Manipulation Details (indicate cue type and reason): Pt reports she requires assist from her husband at baseline for peri-care after a BM. Educated on toileting aid.     Functional mobility during ADLs: Min guard;Cueing for safety;Rolling walker General ADL Comments: Pt functionally limited by increased pain, decreased activity tolerance, and generalized weakness. Requires min cueing for safety t/o functional tasks. Close CGA +2 provided for functional mobilty for pt comfort (She endorses her  RLE buckles on occasion).     Vision Baseline Vision/History: Wears glasses Wears Glasses: At all times Patient Visual Report: No change from baseline       Perception     Praxis      Pertinent Vitals/Pain Pain Assessment:  0-10 Pain Score: 7  Pain Location: Low back/RLE Pain Descriptors / Indicators: Aching;Crying;Grimacing;Guarding;Sore Pain Intervention(s): Limited activity within patient's tolerance;Monitored during session;Repositioned;RN gave pain meds during session     Hand Dominance     Extremity/Trunk Assessment Upper Extremity Assessment Upper Extremity Assessment: Generalized weakness   Lower Extremity Assessment Lower Extremity Assessment: Generalized weakness       Communication Communication Communication: No difficulties   Cognition Arousal/Alertness: Awake/alert Behavior During Therapy: WFL for tasks assessed/performed;Flat affect Overall Cognitive Status: Within Functional Limits for tasks assessed                                     General Comments       Exercises Other Exercises Other Exercises: Pt educated on role of OT in acute setting, safe use of AE/DME for ADL management, log-roll technique for bed mobility to maximize comfort and provide joint protection, and falls prevention strategies for home and hospital.   Shoulder Instructions      Home Living Family/patient expects to be discharged to:: Skilled nursing facility Living Arrangements: Spouse/significant other     Home Access: Level entry     Home Layout: One level               Home Equipment: Walker - 4 wheels;Bedside commode;Shower seat;Grab bars - toilet          Prior Functioning/Environment Level of Independence: Independent with assistive device(s)        Comments: Pt has been more limited over the last few weeks 2/2 pain, apparently with 4WW has been able to manage in the home with minimal OOH activity.        OT Problem List: Decreased strength;Decreased activity tolerance;Decreased safety awareness;Impaired balance (sitting and/or standing);Decreased knowledge of use of DME or AE;Decreased coordination;Decreased range of motion;Pain      OT  Treatment/Interventions: Self-care/ADL training;Therapeutic exercise;Therapeutic activities;DME and/or AE instruction;Patient/family education;Balance training;Energy conservation    OT Goals(Current goals can be found in the care plan section) Acute Rehab OT Goals Patient Stated Goal: To have less pain OT Goal Formulation: With patient Time For Goal Achievement: 03/13/21 Potential to Achieve Goals: Good ADL Goals Pt Will Perform Grooming: sitting;with set-up;with supervision Pt Will Perform Lower Body Dressing: sit to/from stand;with set-up;with supervision Pt Will Transfer to Toilet: bedside commode;ambulating;with set-up;with supervision Pt Will Perform Toileting - Clothing Manipulation and hygiene: sit to/from stand;with adaptive equipment;with set-up;with supervision  OT Frequency: Min 1X/week   Barriers to D/C: Inaccessible home environment;Decreased caregiver support          Co-evaluation              AM-PAC OT "6 Clicks" Daily Activity     Outcome Measure Help from another person eating meals?: None Help from another person taking care of personal grooming?: A Little Help from another person toileting, which includes using toliet, bedpan, or urinal?: A Little Help from another person bathing (including washing, rinsing, drying)?: A Lot Help from another person to put on and taking off regular upper body clothing?: A Little Help from another person to put on and taking off regular lower body clothing?: A Lot  6 Click Score: 17   End of Session Equipment Utilized During Treatment: Gait belt;Rolling walker Nurse Communication: Mobility status;Patient requests pain meds  Activity Tolerance: Patient tolerated treatment well Patient left: in bed;with call bell/phone within reach;with bed alarm set;with nursing/sitter in room  OT Visit Diagnosis: Other abnormalities of gait and mobility (R26.89);Pain;History of falling (Z91.81) Pain - Right/Left: Right Pain - part of  body: Hip;Knee;Leg (LBP)                Time: 7414-2395 OT Time Calculation (min): 46 min Charges:  OT General Charges $OT Visit: 1 Visit OT Evaluation $OT Eval Moderate Complexity: 1 Mod OT Treatments $Self Care/Home Management : 23-37 mins  Shara Blazing, M.S., OTR/L Ascom: [phone] 02/27/21, 4:15 PM

## 2021-02-27 NOTE — Anesthesia Postprocedure Evaluation (Signed)
Anesthesia Post Note  Patient: Susan Salas  Procedure(s) Performed: L4 KYPHOPLASTY  Patient location during evaluation: PACU Anesthesia Type: General Level of consciousness: awake and alert Pain management: pain level controlled Vital Signs Assessment: post-procedure vital signs reviewed and stable Respiratory status: spontaneous breathing, nonlabored ventilation, respiratory function stable and patient connected to nasal cannula oxygen Cardiovascular status: blood pressure returned to baseline and stable Postop Assessment: no apparent nausea or vomiting Anesthetic complications: no   No notable events documented.   Last Vitals:  Vitals:   02/27/21 0756 02/27/21 1126  BP: (!) 148/84 109/64  Pulse: 69 75  Resp: 18 16  Temp: 36.8 C 36.6 C  SpO2: 97% 99%    Last Pain:  Vitals:   02/27/21 0756  TempSrc: Oral  PainSc: Asleep                 Martha Clan

## 2021-02-27 NOTE — Progress Notes (Signed)
Initial Nutrition Assessment  DOCUMENTATION CODES:  Obesity unspecified  INTERVENTION:  Add Ensure Enlive po BID, each supplement provides 350 kcal and 20 grams of protein.  Add Magic cup TID with meals, each supplement provides 290 kcal and 9 grams of protein.  Add MVI with minerals daily.  NUTRITION DIAGNOSIS:  Inadequate oral intake related to decreased appetite as evidenced by per patient/family report.  GOAL:  Patient will meet greater than or equal to 90% of their needs  MONITOR:  PO intake, Supplement acceptance, Labs, Weight trends, Skin, I & O's  REASON FOR ASSESSMENT:  Malnutrition Screening Tool    ASSESSMENT:  85 yo female with a PMH of chronic diastolic heart failure, essential pretension, acquired hypothyroidism, osteoporosis, who is admitted to [facility] on 02/25/2021 with L4 compression fracture after presenting from home to [facility] ED complaining of back pain. The patient experienced a ground-level mechanical fall approximately 2 to 3 weeks ago when she tripped at home while attempting to ambulate.  She did not hit her head escapade of this fall.  Spoke with pt and husband at bedside. Pt reports that her appetite has been poor for the last 3-4 weeks, eating about 25% of what she normally does. She likes to enjoy food, so this is not a usual thing for her. She is starting to feel her appetite coming back now, however. Per Epic, pt ate 75% of her breakfast this morning.  She has not noticed any changes in her weight over this time period. Per Epic, pt's weight has been stable the past 6 months. Care Everywhere endorses this as well.  Pt with no depletions on exam.  Given the above information, pt is not malnourished at this time, but is at risk given poor PO intake x 1 month.  Recommend adding Ensure Enlive BID and Magic Cup TID to promote intake, as well as MVI with minerals.  Medications: reviewed; fentanyl PRN (given once today), Dilaudid PRN  (given once today)  Labs: reviewed; Glucose 117  NUTRITION - FOCUSED PHYSICAL EXAM: Flowsheet Row Most Recent Value  Orbital Region No depletion  Upper Arm Region No depletion  Thoracic and Lumbar Region No depletion  Buccal Region No depletion  Temple Region No depletion  Clavicle Bone Region No depletion  Clavicle and Acromion Bone Region No depletion  Scapular Bone Region No depletion  Dorsal Hand No depletion  Patellar Region No depletion  Anterior Thigh Region No depletion  Posterior Calf Region No depletion  Edema (RD Assessment) None  Hair Reviewed  Eyes Reviewed  Mouth Reviewed  Skin Reviewed  Nails Reviewed   Diet Order:   Diet Order             Diet regular Room service appropriate? Yes; Fluid consistency: Thin  Diet effective now                  EDUCATION NEEDS:  Education needs have been addressed  Skin:  Skin Assessment: Skin Integrity Issues: Skin Integrity Issues:: Incisions Incisions: Back, closed  Last BM:  02/25/21 - Type 4, large  Height:  Ht Readings from Last 1 Encounters:  02/25/21 5\' 4"  (1.626 m)   Weight:  Wt Readings from Last 1 Encounters:  02/25/21 90.3 kg   Ideal Body Weight:  54.5 kg  BMI:  Body mass index is 34.16 kg/m.  Estimated Nutritional Needs:  Kcal:  6712-4580 Protein:  105-120 grams Fluid:  >1.75 L  Derrel Nip, RD, LDN (she/her/hers) Registered Dietitian I  After-Hours/Weekend Pager # in [place]

## 2021-02-28 MED ORDER — ACETAMINOPHEN 500 MG PO TABS
1000.0000 mg | ORAL_TABLET | Freq: Three times a day (TID) | ORAL | Status: DC
  Administered 2021-02-28 – 2021-03-06 (×19): 1000 mg via ORAL
  Filled 2021-02-28 (×19): qty 2

## 2021-02-28 MED ORDER — TRAMADOL HCL 50 MG PO TABS
50.0000 mg | ORAL_TABLET | Freq: Three times a day (TID) | ORAL | Status: DC
  Administered 2021-02-28 – 2021-03-02 (×6): 50 mg via ORAL
  Filled 2021-02-28 (×6): qty 1

## 2021-02-28 NOTE — Progress Notes (Signed)
Physical Therapy Treatment Patient Details Name: Susan Salas MRN: [id number] DOB: 06/20/1936 Today's Date: 02/28/2021    History of Present Illness 85 y/o female who had a fall ~3 weeks ago, now s/p L4 kyphoplasty 6/27.   Medical history significant for chronic diastolic heart failure, essential pretension, acquired hypothyroidism, osteoporosis, Parkinsonin like symptoms.    PT Comments    Pt was supine in bed with HOB elevated ~ 30 degrees and supportive spouse at bedside. Pt agrees to PT session and is cooperative throughout. Endorses no pain at rest but c/o 10/10 pain during ambulation. Pt was able to ambulated 4 bouts. 40 ft, 25 ft, 15 ft, 5 ft. Pt c/o RLE pain mostly. Overall pt tolerated session well. Will greatly benefit form SNF at DC to address deficits while maximizing independence prior to returning home with spouse.  Pt was in bed at conclusion of session with RN aware of pt's abilities/pain.   Follow Up Recommendations  SNF     Equipment Recommendations  None recommended by PT       Precautions / Restrictions Precautions Precautions: Fall Restrictions Weight Bearing Restrictions: No    Mobility  Bed Mobility Overal bed mobility: Needs Assistance Bed Mobility: Sidelying to Sit;Rolling Rolling: Min assist Sidelying to sit: Min assist Supine to sit: Min assist Sit to supine: Max assist Sit to sidelying: Max assist General bed mobility comments: pt required max assist to progress LEs back into bed to retunr to supine after OOB activiyt    Transfers Overall transfer level: Needs assistance Equipment used: Rolling walker (2 wheeled) Transfers: Sit to/from Stand Sit to Stand: Min assist         General transfer comment: Pt performed STS 2 EOB and 2 x from standard height chair. Min assist + vcs for technique  Ambulation/Gait Ambulation/Gait assistance: Min assist;Mod assist Gait Distance (Feet): 30 Feet Assistive device: Rolling walker (2 wheeled) Gait  Pattern/deviations: Shuffle (Parkinsonian gait pattern) Gait velocity: decreased   General Gait Details: Pt did have some knee buckling present during ambulation fatigued quickly. Parkinsonian gait pattern throughout     Balance Overall balance assessment: Needs assistance Sitting-balance support: Feet supported;No upper extremity supported Sitting balance-Leahy Scale: Good Sitting balance - Comments: no LOB in sitting   Standing balance support: Bilateral upper extremity supported;During functional activity Standing balance-Leahy Scale: Poor Standing balance comment: High fall risk 2/2 to knees buckling/weakness/parkinsons       Cognition Arousal/Alertness: Lethargic;Suspect due to medications Behavior During Therapy: Flat affect Overall Cognitive Status: Within Functional Limits for tasks assessed    General Comments: Pt was able to follow commands throughout without difficulty             Pertinent Vitals/Pain Pain Assessment: 0-10 Pain Score: 0-No pain (0 pain at rest. 10/10 pain with ambulation) Pain Location: Low back/RLE Pain Intervention(s): Limited activity within patient's tolerance;Monitored during session;Premedicated before session;Repositioned     PT Goals (current goals can now be found in the care plan section) Acute Rehab PT Goals Patient Stated Goal: none stated Progress towards PT goals: Progressing toward goals    Frequency    7X/week      PT Plan Current plan remains appropriate       AM-PAC PT "6 Clicks" Mobility   Outcome Measure  Help needed turning from your back to your side while in a flat bed without using bedrails?: A Little Help needed moving from lying on your back to sitting on the side of a flat bed without using  bedrails?: A Little Help needed moving to and from a bed to a chair (including a wheelchair)?: A Lot Help needed standing up from a chair using your arms (e.g., wheelchair or bedside chair)?: A Lot Help needed to  walk in hospital room?: A Lot Help needed climbing 3-5 steps with a railing? : A Lot 6 Click Score: 14    End of Session Equipment Utilized During Treatment: Gait belt Activity Tolerance: Patient limited by pain;Patient limited by fatigue Patient left: with call bell/phone within reach;with nursing/sitter in room;with family/visitor present;in bed Nurse Communication: Mobility status PT Visit Diagnosis: Unsteadiness on feet (R26.81);Muscle weakness (generalized) (M62.81);Difficulty in walking, not elsewhere classified (R26.2);Pain     Time: 0920-0943 PT Time Calculation (min) (ACUTE ONLY): 23 min  Charges:  $Gait Training: 8-22 mins $Therapeutic Activity: 8-22 mins                     Julaine Fusi PTA 02/28/21, 10:04 AM

## 2021-02-28 NOTE — Progress Notes (Signed)
Occupational Therapy Treatment Patient Details Name: Susan Salas MRN: [id number] DOB: 1936/03/16 Today's Date: 02/28/2021    History of present illness 85 y/o female who had a fall ~3 weeks ago, now s/p L4 kyphoplasty 6/27.   Medical history significant for chronic diastolic heart failure, essential pretension, acquired hypothyroidism, osteoporosis, Parkinsonin like symptoms.   OT comments  Susan Salas was seen for OT treatment on this date. Upon arrival to room pt sleeping soundly in bed with sitter and spouse present at bedside. Pt wakes to VCs and agreeable to OT tx session. She denies pain and endorses recently having pain medication. OT facilitates education on safe use of AE for LB dressing tasks to maximize comfort and minimize pt need to bend or twist at her back. Pt return demos with MIN A and cueing for sequencing t/o session. She appears to have more difficulty when donning R side clothing, but overall demonstrates good understanding of education provided. Pt making good progress toward goals. Pt continues to benefit from skilled OT services to maximize return to PLOF and minimize risk of future falls, injury, caregiver burden, and readmission. Will continue to follow POC. Discharge recommendation remains appropriate.    Follow Up Recommendations  SNF;Supervision - Intermittent    Equipment Recommendations  3 in 1 bedside commode    Recommendations for Other Services      Precautions / Restrictions Precautions Precautions: Fall Restrictions Weight Bearing Restrictions: No       Mobility Bed Mobility Overal bed mobility: Needs Assistance Bed Mobility: Sidelying to Sit;Rolling Rolling: Min assist Sidelying to sit: Min assist Supine to sit: Min assist Sit to supine: Min assist Sit to sidelying: Min assist General bed mobility comments: Increased assist reuired for bed mobility this date.    Transfers Overall transfer level: Needs assistance                     Balance Overall balance assessment: Needs assistance Sitting-balance support: Feet supported;No upper extremity supported Sitting balance-Leahy Scale: Good Sitting balance - Comments: Steady static/dynamic sitting during functional tasks. Able to reach outside BOS w/o LOB   Standing balance support: Bilateral upper extremity supported;During functional activity Standing balance-Leahy Scale: Poor                             ADL either performed or assessed with clinical judgement   ADL                       Lower Body Dressing: Sitting/lateral leans;Minimal assistance;With adaptive equipment;Cueing for sequencing Lower Body Dressing Details (indicate cue type and reason): Pt educated on AE for LB dressing this date. Pt able to return demonstrate use of [place], LHR, and sock aid to doff/don bilat hospital socks. Increaed time/effort to perform as well as cueing provided t/o session. MIN A to don hospital sock on LLE.             Functional mobility during ADLs: Min guard;Cueing for safety;Rolling walker General ADL Comments: Pt continues to be functionally limited by increased pain, decreased activity tolerance, and generalized weakness. Requires min cueing for safety t/o functional tasks. Close CGA provided for functional mobilty 2/2 RLE buckle     Vision Baseline Vision/History: Wears glasses Wears Glasses: At all times Patient Visual Report: No change from baseline     Perception     Praxis      Cognition Arousal/Alertness: Lethargic;Suspect due to  medications Behavior During Therapy: Flat affect Overall Cognitive Status: Within Functional Limits for tasks assessed                                 General Comments: Decreased STM which pt states is baseline. Requires occasional cueing to follow VCs during session.        Exercises Other Exercises Other Exercises: Pt and caregiver (spouse at bedside) provided with reinforcement of  prior education on role of OT in acute setting, safe use of AE/DME for ADL management, log-roll technique for bed mobility to maximize comfort and provide joint protection, and falls prevention strategies for home and hospital. OT facilitates LB dressing tasks with education on AE provided t/o session. See ADL section for additional detail.   Shoulder Instructions       General Comments      Pertinent Vitals/ Pain       Pain Assessment: No/denies pain Pain Intervention(s): Premedicated before session;Monitored during session  Home Living                                          Prior Functioning/Environment              Frequency  Min 1X/week        Progress Toward Goals  OT Goals(current goals can now be found in the care plan section)  Progress towards OT goals: Progressing toward goals  Acute Rehab OT Goals Patient Stated Goal: To go to rehab OT Goal Formulation: With patient Time For Goal Achievement: 03/13/21 Potential to Achieve Goals: Good  Plan Discharge plan remains appropriate;Frequency remains appropriate    Co-evaluation                 AM-PAC OT "6 Clicks" Daily Activity     Outcome Measure   Help from another person eating meals?: None Help from another person taking care of personal grooming?: A Little Help from another person toileting, which includes using toliet, bedpan, or urinal?: A Little Help from another person bathing (including washing, rinsing, drying)?: A Lot Help from another person to put on and taking off regular upper body clothing?: A Little Help from another person to put on and taking off regular lower body clothing?: A Lot 6 Click Score: 17    End of Session Equipment Utilized During Treatment: Gait belt;Rolling walker  OT Visit Diagnosis: Other abnormalities of gait and mobility (R26.89);Pain;History of falling (Z91.81) Pain - Right/Left: Right Pain - part of body: Hip;Knee;Leg   Activity  Tolerance Patient tolerated treatment well   Patient Left in bed;with call bell/phone within reach;with nursing/sitter in room;with family/visitor present   Nurse Communication          Time: 6808-8110 OT Time Calculation (min): 33 min  Charges: OT General Charges $OT Visit: 1 Visit OT Treatments $Self Care/Home Management : 8-22 mins $Therapeutic Activity: 8-22 mins  Shara Blazing, M.S., OTR/L Ascom: [phone] 02/28/21, 3:56 PM

## 2021-02-28 NOTE — TOC Progression Note (Signed)
Transition of Care [facility]) - Progression Note    Patient Details  Name: Susan Salas MRN: [id number] Date of Birth: 26-Jul-1936  Transition of Care [facility]) CM/SW [place], RN Phone Number: 02/28/2021, 3:25 PM  Clinical Narrative:     Met with the patient and her husband in the room, reviewed Bed offers and start rating from Medicare, they chose the bed offer from [facility], I notified Tammy       Expected Discharge Plan and Services                                                 Social Determinants of Health (SDOH) Interventions    Readmission Risk Interventions No flowsheet data found.

## 2021-02-28 NOTE — Hospital Course (Signed)
85 yo female with past hx of HTN, Parkinson's, back pain, presenting for pain control and kyphoplasty for a subacute L4 compression fracture after sustaining a mechanical fall 2-3 weeks ago.

## 2021-02-28 NOTE — Progress Notes (Signed)
PROGRESS NOTE    Susan Salas   HQI:[id number]  DOB: 1936/08/24  PCP: Adin Hector, MD    DOA: 02/25/2021 LOS: 2   Assessment & Plan   Principal Problem:   Closed compression fracture of L4 lumbar vertebra, initial encounter [facility]) Active Problems:   Low back pain   Chronic diastolic CHF (congestive heart failure) (HCC)   Hypertension   Osteoporosis   Hypothyroidism   Parkinsonism ([place])    Subacute L4 Compression fracture secondary to Osteoporosis MRI on 6/22 w/o signs cord compression.  S/p L4 kyphoplasty on 6/27 - pain control tramadol standing, oxycodone prn, hydromorphone prn. Have also started Also starting calcitonin - pt/ot advising snf, placement pending - is on prolia as outpt   Right groin pain - no abnormality on physical exam Ultrasound was negative for findings to explain, did show a normal appearing lymph node.   Left facial numbness - appears resolved. Reported 6/28 per prior attending: complained of sensation of numbness left side of face lateral to the nose. Normal neuro exam.   6/29 no report of this - monitor   Delirium - 2/2 pain, procedure, narcotics.   When pain uncontrolled, pt gets very restless, poor insight, tries to get up and is high fall risk. Improving - does okay when pain is controlled. --Sitter PRN ----Delirium precautions:     -Lights and TV off, minimize interruptions at night    -Blinds open and lights on during day    -Glasses/hearing aid with patient    -Frequent reorientation    -PT/OT when able    -Avoid sedation medications as possible, but control pain as best we can   Hypothyroidism - home synthroid   Hypertension - home losartan   Parkinsonism - home Requip  Obesity: Body mass index is 32.73 kg/m.  Complicates overall care and prognosis.  Recommend lifestyle modifications including physical activity and diet for weight loss and overall long-term health.   DVT prophylaxis: SCDs Start: 02/26/21  0048   Diet:  Diet Orders (From admission, onward)     Start     Ordered   02/26/21 1537  Diet regular Room service appropriate? Yes; Fluid consistency: Thin  Diet effective now       Question Answer Comment  Room service appropriate? Yes   Fluid consistency: Thin      02/26/21 1536              Code Status: Full Code   Brief Narrative / Hospital Course to Date:   85 yo female with past hx of HTN, Parkinson's, back pain, presenting for pain control and kyphoplasty for a subacute L4 compression fracture after sustaining a mechanical fall 2-3 weeks ago.    Subjective 02/28/21    Patient somnolent but arousable when seen today.  Husband and sitter at bedside.  Sitter reports patient very restless and trying to get up out of bed earlier when her pain was uncontrolled.  Patient awakes easily and says she does fine as long as her pain is controlled.  Did require IV pain medication this morning again.  No other acute events reported.   Disposition Plan & Communication   Status is: Inpatient  Remains inpatient appropriate because:IV treatments appropriate due to intensity of illness or inability to take PO, requiring IV pain medicine, requires SNF placement which is pending  Dispo: The patient is from: Home              Anticipated d/c is to: SNF  Patient currently is not medically stable to d/c.   Difficult to place patient No  Family Communication: Husband at bedside on rounds   Consults, Procedures, Significant Events   Consultants:  Orthopedic surgery  Procedures:  Kyphoplasty  Antimicrobials:  Anti-infectives (From admission, onward)    Start     Dose/Rate Route Frequency Ordered Stop   02/26/21 1145  ceFAZolin (ANCEF) 2-4 GM/100ML-% IVPB       Note to Pharmacy: Maryagnes Amos   : cabinet override      02/26/21 1145 02/26/21 1238   02/26/21 0600  ceFAZolin (ANCEF) IVPB 2g/100 mL premix        2 g 200 mL/hr over 30 Minutes Intravenous On call  to O.R. 02/26/21 0022 02/26/21 1221         Micro    Objective   Vitals:   02/28/21 0526 02/28/21 0755 02/28/21 1145 02/28/21 1605  BP: 138/76 (!) 153/74 (!) 142/70 136/75  Pulse: 73 74 67 73  Resp: 15 16 16 16   Temp: 98.1 F (36.7 C) 98.1 F (36.7 C) 98.3 F (36.8 C) 97.7 F (36.5 C)  TempSrc: Oral Oral Oral Oral  SpO2: 94% 91% 92% 100%  Weight:      Height:        Intake/Output Summary (Last 24 hours) at 02/28/2021 1821 Last data filed at 02/28/2021 1354 Gross per 24 hour  Intake 240 ml  Output 1 ml  Net 239 ml   Filed Weights   02/25/21 2022 02/28/21 0500  Weight: 90.3 kg 86.5 kg    Physical Exam:  General exam: Somnolent but wakes to voice, no acute distress HEENT: atraumatic, clear conjunctiva, anicteric sclera, moist mucus membranes, hearing grossly normal  Respiratory system: CTAB, no wheezes, rales or rhonchi, normal respiratory effort. Cardiovascular system: normal S1/S2, RRR.   Gastrointestinal system: soft, NT, ND Central nervous system: no gross focal neurologic deficits, normal speech Extremities: moves all, no cyanosis, normal tone Psychiatry: normal mood, congruent affect, judgement and insight appear normal  Labs   Data Reviewed: I have personally reviewed following labs and imaging studies  CBC: Recent Labs  Lab 02/25/21 2050 02/26/21 0739 02/27/21 0507  WBC 9.2 11.3* 9.0  NEUTROABS 6.5  --   --   HGB 15.1* 14.8 13.9  HCT 44.9 44.5 42.3  MCV 93.5 92.7 93.0  PLT 287 315 106   Basic Metabolic Panel: Recent Labs  Lab 02/25/21 2050 02/26/21 0739 02/27/21 0507  NA 140 143 142  K 3.8 3.6 3.5  CL 101 106 104  CO2 31 29 30   GLUCOSE 120* 110* 117*  BUN 23 16 14   CREATININE 0.74 0.68 0.62  CALCIUM 9.9 9.9 9.6  MG  --  2.5*  --    GFR: Estimated Creatinine Clearance: 56.7 mL/min (by C-G formula based on SCr of 0.62 mg/dL). Liver Function Tests: Recent Labs  Lab 02/25/21 2050 02/26/21 0739  AST 21 28  ALT 27 32  ALKPHOS 113  113  BILITOT 1.1 1.4*  PROT 7.4 7.3  ALBUMIN 4.4 4.3   Recent Labs  Lab 02/25/21 2050  LIPASE 25   No results for input(s): AMMONIA in the last 168 hours. Coagulation Profile: Recent Labs  Lab 02/26/21 0739  INR 1.0   Cardiac Enzymes: No results for input(s): CKTOTAL, CKMB, CKMBINDEX, TROPONINI in the last 168 hours. BNP (last 3 results) No results for input(s): PROBNP in the last 8760 hours. HbA1C: No results for input(s): HGBA1C in the last 72 hours. CBG:  No results for input(s): GLUCAP in the last 168 hours. Lipid Profile: No results for input(s): CHOL, HDL, LDLCALC, TRIG, CHOLHDL, LDLDIRECT in the last 72 hours. Thyroid Function Tests: No results for input(s): TSH, T4TOTAL, FREET4, T3FREE, THYROIDAB in the last 72 hours. Anemia Panel: No results for input(s): VITAMINB12, FOLATE, FERRITIN, TIBC, IRON, RETICCTPCT in the last 72 hours. Sepsis Labs: No results for input(s): PROCALCITON, LATICACIDVEN in the last 168 hours.  Recent Results (from the past 240 hour(s))  Resp Panel by RT-PCR (Flu A&B, Covid)     Status: None   Collection Time: 02/26/21  2:22 AM   Specimen: Nasopharyngeal(NP) swabs in vial transport medium  Result Value Ref Range Status   SARS Coronavirus 2 by RT PCR NEGATIVE NEGATIVE Final    Comment: (NOTE) SARS-CoV-2 target nucleic acids are NOT DETECTED.  The SARS-CoV-2 RNA is generally detectable in upper respiratory specimens during the acute phase of infection. The lowest concentration of SARS-CoV-2 viral copies this assay can detect is 138 copies/mL. A negative result does not preclude SARS-Cov-2 infection and should not be used as the sole basis for treatment or other patient management decisions. A negative result may occur with  improper specimen collection/handling, submission of specimen other than nasopharyngeal swab, presence of viral mutation(s) within the areas targeted by this assay, and inadequate number of viral copies(<138  copies/mL). A negative result must be combined with clinical observations, patient history, and epidemiological information. The expected result is Negative.  Fact Sheet for Patients:  EntrepreneurPulse.com.au  Fact Sheet for Healthcare Providers:  IncredibleEmployment.be  This test is no t yet approved or cleared by the [place] FDA and  has been authorized for detection and/or diagnosis of SARS-CoV-2 by FDA under an Emergency Use Authorization (EUA). This EUA will remain  in effect (meaning this test can be used) for the duration of the COVID-19 declaration under Section 564(b)(1) of the Act, 21 U.S.C.section 360bbb-3(b)(1), unless the authorization is terminated  or revoked sooner.       Influenza A by PCR NEGATIVE NEGATIVE Final   Influenza B by PCR NEGATIVE NEGATIVE Final    Comment: (NOTE) The Xpert Xpress SARS-CoV-2/FLU/RSV plus assay is intended as an aid in the diagnosis of influenza from Nasopharyngeal swab specimens and should not be used as a sole basis for treatment. Nasal washings and aspirates are unacceptable for Xpert Xpress SARS-CoV-2/FLU/RSV testing.  Fact Sheet for Patients: EntrepreneurPulse.com.au  Fact Sheet for Healthcare Providers: IncredibleEmployment.be  This test is not yet approved or cleared by the [place] FDA and has been authorized for detection and/or diagnosis of SARS-CoV-2 by FDA under an Emergency Use Authorization (EUA). This EUA will remain in effect (meaning this test can be used) for the duration of the COVID-19 declaration under Section 564(b)(1) of the Act, 21 U.S.C. section 360bbb-3(b)(1), unless the authorization is terminated or revoked.  Performed at [facility], [place], [id number] [id number]       Imaging Studies   [place] RT LOWER EXTREM LTD SOFT TISSUE NON VASCULAR  Result Date: 02/27/2021 CLINICAL DATA:  Right groin  discomfort for 3 weeks. EXAM: ULTRASOUND RIGHT LOWER EXTREMITY LIMITED TECHNIQUE: Ultrasound examination of the lower extremity soft tissues was performed in the area of clinical concern. COMPARISON:  None. FINDINGS: In the area of pain indicated by the patient in the right groin is a 17 x 7 x 10 mm lymph node with normal sonographic morphology. No other mass, fluid collection, or hernia is identified. IMPRESSION: Normal  appearing lymph node in the right groin. Electronically Signed   By: Logan Bores M.D.   On: 02/27/2021 16:11     Medications   Scheduled Meds:  acetaminophen  1,000 mg Oral Q8H   aspirin EC  81 mg Oral Daily   calcitonin (salmon)  1 spray Alternating Nares Daily   carvedilol  10 mg Oral QHS   DULoxetine  60 mg Oral Daily   enoxaparin (LOVENOX) injection  0.5 mg/kg Subcutaneous Q24H   feeding supplement  237 mL Oral BID BM   lidocaine  1 patch Transdermal Q24H   losartan  25 mg Oral Daily   multivitamin with minerals  1 tablet Oral Daily   rOPINIRole  0.25 mg Oral TID   traMADol  50 mg Oral Q8H   Continuous Infusions:     LOS: 2 days    Time spent: 30 minutes    Ezekiel Slocumb, DO [facility]  02/28/2021, 6:21 PM      If 7PM-7AM, please contact night-coverage. How to contact the [facility] Attending or Consulting provider [place] or covering provider during after hours [place], for this patient?    Check the care team in [facility] and look for a) attending/consulting TRH provider listed and b) the [facility] team listed Log into [email or web address] and use [place] universal password to access. If you do not have the password, please contact the hospital operator. Locate the [facility] provider you are looking for under [facility] and page to a number that you can be directly reached. If you still have difficulty reaching the provider, please page the [facility] (Director on Call) for the Hospitalists listed on amion for assistance.

## 2021-03-01 LAB — SARS CORONAVIRUS 2 (TAT 6-24 HRS): SARS Coronavirus 2: NEGATIVE

## 2021-03-01 LAB — BASIC METABOLIC PANEL
Anion gap: 9 (ref 5–15)
BUN: 15 mg/dL (ref 8–23)
CO2: 31 mmol/L (ref 22–32)
Calcium: 9.7 mg/dL (ref 8.9–10.3)
Chloride: 99 mmol/L (ref 98–111)
Creatinine, Ser: 0.6 mg/dL (ref 0.44–1.00)
GFR, Estimated: >60 mL/min (ref >60)
Glucose, Bld: 101 mg/dL — ABNORMAL HIGH (ref 70–99)
Potassium: 3.6 mmol/L (ref 3.5–5.1)
Sodium: 139 mmol/L (ref 135–145)

## 2021-03-01 MED ORDER — HALOPERIDOL LACTATE 5 MG/ML IJ SOLN: 1.0000 mg | Freq: Four times a day (QID) | INTRAMUSCULAR | Status: DC | PRN

## 2021-03-01 MED ORDER — GABAPENTIN 100 MG PO CAPS
200.0000 mg | ORAL_CAPSULE | Freq: Three times a day (TID) | ORAL | Status: DC
  Administered 2021-03-01 – 2021-03-03 (×6): 200 mg via ORAL
  Filled 2021-03-01 (×7): qty 2

## 2021-03-01 MED ORDER — LORAZEPAM 2 MG/ML IJ SOLN
0.5000 mg | INTRAMUSCULAR | Status: DC | PRN
  Administered 2021-03-01 (×3): 0.5 mg via INTRAVENOUS
  Filled 2021-03-01 (×3): qty 1

## 2021-03-01 MED ORDER — METHOCARBAMOL 500 MG PO TABS
500.0000 mg | ORAL_TABLET | Freq: Three times a day (TID) | ORAL | Status: DC
  Administered 2021-03-01 – 2021-03-03 (×7): 500 mg via ORAL
  Filled 2021-03-01 (×7): qty 1

## 2021-03-01 NOTE — TOC Progression Note (Signed)
Transition of Care [facility]) - Progression Note    Patient Details  Name: Susan Salas MRN: [id number] Date of Birth: [birth year]  Transition of Care [facility]) CM/SW Contact  Su Hilt, RN Phone Number: 03/01/2021, 8:37 AM  Clinical Narrative:     Due to the patient being confused at night and not following safety protocol she has a sitter in place, She will not be discharged to SNF until can be sitter free for 24 hours       Expected Discharge Plan and Services                                                 Social Determinants of Health (SDOH) Interventions    Readmission Risk Interventions No flowsheet data found.

## 2021-03-01 NOTE — Care Management Important Message (Signed)
Important Message  Patient Details  Name: Susan Salas MRN: [id number] Date of Birth: 12/17/1935   Medicare Important Message Given:  Yes     [name] [name] 03/01/2021, 2:32 PM

## 2021-03-01 NOTE — Progress Notes (Signed)
Physical Therapy Treatment Patient Details Name: Susan Salas MRN: [id number] DOB: June 30, 1936 Today's Date: 03/01/2021    History of Present Illness 85 y/o female who had a fall ~3 weeks ago, now s/p L4 kyphoplasty 6/27.   Medical history significant for chronic diastolic heart failure, essential pretension, acquired hypothyroidism, osteoporosis, Parkinsonin like symptoms.    PT Comments    Pt was long sitting in bed upon arriving. She had rough night but did agree to PT/OOB activity. Pt much more confused today. She was oriented to self only and needed increased assistance with all mobility, transfers, and gait. Pt extremely high fall risk. Parkinson's and cognition greatly impacting progression of PT. Highly recommend DC to SNF to address deficits while maximizing independence. Support spouse arrived at conclusion of session. RN aware pt required more assistance today. Acute PT will continue to follow.      Follow Up Recommendations  SNF     Equipment Recommendations  None recommended by PT       Precautions / Restrictions Precautions Precautions: Fall Restrictions Weight Bearing Restrictions: No    Mobility  Bed Mobility Overal bed mobility: Needs Assistance Bed Mobility: Sidelying to Sit;Rolling Rolling: Min assist Sidelying to sit: Mod assist Supine to sit: Mod assist     General bed mobility comments: Pt required mod assist to achieve EOB short sit via log roll    Transfers Overall transfer level: Needs assistance Equipment used: Rolling walker (2 wheeled) Transfers: Sit to/from Stand Sit to Stand: Mod assist         General transfer comment: Mod assist to stand to RW with max vcs. pt kept eyes closed throughout much of session  Ambulation/Gait Ambulation/Gait assistance: Mod assist;Max assist Gait Distance (Feet): 5 Feet Assistive device: Rolling walker (2 wheeled) Gait Pattern/deviations: Shuffle (parkinsonian/ knee buckling) Gait velocity: decreased    General Gait Details: Pt has much more sever knee buckling/shuffle today. Unsafe to ambulate further distances due to poor standing safety/high fall risk   Stairs             Wheelchair Mobility    Modified Rankin (Stroke Patients Only)       Balance Overall balance assessment: Needs assistance Sitting-balance support: Feet supported;No upper extremity supported Sitting balance-Leahy Scale: Good Sitting balance - Comments: no LOB sitting EOB with supervision   Standing balance support: Bilateral upper extremity supported;During functional activity Standing balance-Leahy Scale: Poor Standing balance comment: Extremely high fall risk. Required more assistance today.                            Cognition Arousal/Alertness: Lethargic;Suspect due to medications Behavior During Therapy: Anxious;WFL for tasks assessed/performed Overall Cognitive Status: History of cognitive impairments - at baseline                                 General Comments: pt is much more confused today.Oriented to self only. Inconsistently follows one step commands.      Exercises      General Comments        Pertinent Vitals/Pain Pain Assessment:  (unable to rate)    Home Living                      Prior Function            PT Goals (current goals can now be found in the care  plan section) Acute Rehab PT Goals Patient Stated Goal: none stated Progress towards PT goals: Not progressing toward goals - comment (cognition/parkinsons limiting)    Frequency    7X/week      PT Plan Current plan remains appropriate    Co-evaluation              AM-PAC PT "6 Clicks" Mobility   Outcome Measure  Help needed turning from your back to your side while in a flat bed without using bedrails?: A Little Help needed moving from lying on your back to sitting on the side of a flat bed without using bedrails?: A Lot Help needed moving to and from a bed  to a chair (including a wheelchair)?: A Lot Help needed standing up from a chair using your arms (e.g., wheelchair or bedside chair)?: A Lot Help needed to walk in hospital room?: A Lot Help needed climbing 3-5 steps with a railing? : Total 6 Click Score: 12    End of Session Equipment Utilized During Treatment: Gait belt Activity Tolerance: Other (comment) (limited by cognition/parkinsons disease) Patient left: in chair;with call bell/phone within reach;with family/visitor present;with nursing/sitter in room Nurse Communication: Mobility status PT Visit Diagnosis: Unsteadiness on feet (R26.81);Muscle weakness (generalized) (M62.81);Difficulty in walking, not elsewhere classified (R26.2);Pain     Time: 0836-0900 PT Time Calculation (min) (ACUTE ONLY): 24 min  Charges:  $Gait Training: 8-22 mins $Therapeutic Activity: 8-22 mins                     Julaine Fusi PTA 03/01/21, 9:32 AM

## 2021-03-01 NOTE — Plan of Care (Signed)
  Problem: Clinical Measurements: Goal: Ability to maintain clinical measurements within normal limits will improve Outcome: Progressing   

## 2021-03-01 NOTE — Progress Notes (Signed)
PROGRESS NOTE    Susan Salas   GLO:[id number]  DOB: 27-Apr-1936  PCP: Adin Hector, MD    DOA: 02/25/2021 LOS: 3   Assessment & Plan   Principal Problem:   Closed compression fracture of L4 lumbar vertebra, initial encounter [facility]) Active Problems:   Low back pain   Chronic diastolic CHF (congestive heart failure) (HCC)   Hypertension   Osteoporosis   Hypothyroidism   Parkinsonism ([place])    Subacute L4 Compression fracture secondary to Osteoporosis MRI on 6/22 w/o signs cord compression.  S/p L4 kyphoplasty on 6/27 - pain control tramadol standing, oxycodone prn, hydromorphone prn.  - continue calcitonin - add low dose gabapentin and Robaxin as it seems radicular and muscle spasm pain contributing and not helped by opioids adequately. - pt/ot advising SNF, placement pending - will need to be sitter-free x 24 hrs - is on prolia as outpt   Right groin pain - suspect radicular in nature No abnormality on physical exam Ultrasound was negative for findings to explain, did show a normal appearing lymph node.   Left facial numbness - appears resolved. Reported 6/28 per prior attending: complained of sensation of numbness left side of face lateral to the nose. Normal neuro exam.   6/29 no report of this - monitor   Delirium - 2/2 pain, procedure, narcotics.   When pain uncontrolled, pt gets very restless, poor insight, tries to get up and is high fall risk. Improving - does okay when pain is controlled. --Sitter PRN ----Delirium precautions:     -Lights and TV off, minimize interruptions at night    -Blinds open and lights on during day    -Glasses/hearing aid with patient    -Frequent reorientation    -PT/OT when able    -Avoid sedation medications as possible, but control pain as best we can   Hypothyroidism - home synthroid   Hypertension - home losartan   Parkinsonism - home Requip  Obesity: Body mass index is 32.73 kg/m.  Complicates overall care and  prognosis.  Recommend lifestyle modifications including physical activity and diet for weight loss and overall long-term health.   DVT prophylaxis: SCDs Start: 02/26/21 0048   Diet:  Diet Orders (From admission, onward)     Start     Ordered   02/26/21 1537  Diet regular Room service appropriate? Yes; Fluid consistency: Thin  Diet effective now       Question Answer Comment  Room service appropriate? Yes   Fluid consistency: Thin      02/26/21 1536              Code Status: Full Code   Brief Narrative / Hospital Course to Date:   85 yo female with past hx of HTN, Parkinson's, back pain, presenting for pain control and kyphoplasty for a subacute L4 compression fracture after sustaining a mechanical fall 2-3 weeks ago.    Subjective 03/01/21    Patient brushing her teeth with NT, husband at bedside.  She quickly falls asleep once finished with hygiene, medicated for pain.  She required sitter for safety again overnight due to restlessness when pain gets uncontrolled.    Husband describes pt always having leg pain FIRST, before back pain exacerbates.  Expresses frustration her pain is still not controlled.  We discussed adding medication for nerve pain.  He also describes pt getting hard "knots" in her muscles (says he can feel them), likely muscle spasms.  He is agreeable with med for  muscle relaxant as well.   Disposition Plan & Communication   Status is: Inpatient  Remains inpatient appropriate because:IV treatments appropriate due to intensity of illness or inability to take PO, requiring IV pain medicine, requires SNF placement which is pending  Dispo: The patient is from: Home              Anticipated d/c is to: SNF              Patient currently is not medically stable to d/c.   Difficult to place patient No  Family Communication: Husband at bedside on rounds   Consults, Procedures, Significant Events   Consultants:  Orthopedic surgery  Procedures:   Kyphoplasty  Antimicrobials:  Anti-infectives (From admission, onward)    Start     Dose/Rate Route Frequency Ordered Stop   02/26/21 1145  ceFAZolin (ANCEF) 2-4 GM/100ML-% IVPB       Note to Pharmacy: Maryagnes Amos   : cabinet override      02/26/21 1145 02/26/21 1238   02/26/21 0600  ceFAZolin (ANCEF) IVPB 2g/100 mL premix        2 g 200 mL/hr over 30 Minutes Intravenous On call to O.R. 02/26/21 0022 02/26/21 1221         Micro    Objective   Vitals:   03/01/21 0331 03/01/21 0735 03/01/21 1211 03/01/21 1553  BP: (!) 144/82 (!) 141/85 (!) 154/79 120/81  Pulse: 79 78 79 83  Resp: 17 16 18 16   Temp: 98.2 F (36.8 C) 97.8 F (36.6 C) 97.9 F (36.6 C) 98.9 F (37.2 C)  TempSrc: Oral Oral Axillary   SpO2: 91% 97% 94% 100%  Weight:      Height:        Intake/Output Summary (Last 24 hours) at 03/01/2021 1654 Last data filed at 03/01/2021 1410 Gross per 24 hour  Intake 120 ml  Output --  Net 120 ml   Filed Weights   02/25/21 2022 02/28/21 0500  Weight: 90.3 kg 86.5 kg    Physical Exam:  General exam: awake brushing teeth, quickly somnolent once finished, no acute distress, mildly confused Respiratory system: CTAB, no wheezes, rales or rhonchi, normal respiratory effort. Cardiovascular system: RRR, no peripheral edema Gastrointestinal system: soft, NT, ND Central nervous system: no gross focal neurologic deficits, normal speech  Labs   Data Reviewed: I have personally reviewed following labs and imaging studies  CBC: Recent Labs  Lab 02/25/21 2050 02/26/21 0739 02/27/21 0507  WBC 9.2 11.3* 9.0  NEUTROABS 6.5  --   --   HGB 15.1* 14.8 13.9  HCT 44.9 44.5 42.3  MCV 93.5 92.7 93.0  PLT 287 315 660   Basic Metabolic Panel: Recent Labs  Lab 02/25/21 2050 02/26/21 0739 02/27/21 0507 03/01/21 0446  NA 140 143 142 139  K 3.8 3.6 3.5 3.6  CL 101 106 104 99  CO2 31 29 30 31   GLUCOSE 120* 110* 117* 101*  BUN 23 16 14 15   CREATININE 0.74 0.68  0.62 0.60  CALCIUM 9.9 9.9 9.6 9.7  MG  --  2.5*  --   --    GFR: Estimated Creatinine Clearance: 56.7 mL/min (by C-G formula based on SCr of 0.6 mg/dL). Liver Function Tests: Recent Labs  Lab 02/25/21 2050 02/26/21 0739  AST 21 28  ALT 27 32  ALKPHOS 113 113  BILITOT 1.1 1.4*  PROT 7.4 7.3  ALBUMIN 4.4 4.3   Recent Labs  Lab 02/25/21 2050  LIPASE 25  No results for input(s): AMMONIA in the last 168 hours. Coagulation Profile: Recent Labs  Lab 02/26/21 0739  INR 1.0   Cardiac Enzymes: No results for input(s): CKTOTAL, CKMB, CKMBINDEX, TROPONINI in the last 168 hours. BNP (last 3 results) No results for input(s): PROBNP in the last 8760 hours. HbA1C: No results for input(s): HGBA1C in the last 72 hours. CBG: No results for input(s): GLUCAP in the last 168 hours. Lipid Profile: No results for input(s): CHOL, HDL, LDLCALC, TRIG, CHOLHDL, LDLDIRECT in the last 72 hours. Thyroid Function Tests: No results for input(s): TSH, T4TOTAL, FREET4, T3FREE, THYROIDAB in the last 72 hours. Anemia Panel: No results for input(s): VITAMINB12, FOLATE, FERRITIN, TIBC, IRON, RETICCTPCT in the last 72 hours. Sepsis Labs: No results for input(s): PROCALCITON, LATICACIDVEN in the last 168 hours.  Recent Results (from the past 240 hour(s))  Resp Panel by RT-PCR (Flu A&B, Covid)     Status: None   Collection Time: 02/26/21  2:22 AM   Specimen: Nasopharyngeal(NP) swabs in vial transport medium  Result Value Ref Range Status   SARS Coronavirus 2 by RT PCR NEGATIVE NEGATIVE Final    Comment: (NOTE) SARS-CoV-2 target nucleic acids are NOT DETECTED.  The SARS-CoV-2 RNA is generally detectable in upper respiratory specimens during the acute phase of infection. The lowest concentration of SARS-CoV-2 viral copies this assay can detect is 138 copies/mL. A negative result does not preclude SARS-Cov-2 infection and should not be used as the sole basis for treatment or other patient  management decisions. A negative result may occur with  improper specimen collection/handling, submission of specimen other than nasopharyngeal swab, presence of viral mutation(s) within the areas targeted by this assay, and inadequate number of viral copies(<138 copies/mL). A negative result must be combined with clinical observations, patient history, and epidemiological information. The expected result is Negative.  Fact Sheet for Patients:  EntrepreneurPulse.com.au  Fact Sheet for Healthcare Providers:  IncredibleEmployment.be  This test is no t yet approved or cleared by the [place] FDA and  has been authorized for detection and/or diagnosis of SARS-CoV-2 by FDA under an Emergency Use Authorization (EUA). This EUA will remain  in effect (meaning this test can be used) for the duration of the COVID-19 declaration under Section 564(b)(1) of the Act, 21 U.S.C.section 360bbb-3(b)(1), unless the authorization is terminated  or revoked sooner.       Influenza A by PCR NEGATIVE NEGATIVE Final   Influenza B by PCR NEGATIVE NEGATIVE Final    Comment: (NOTE) The Xpert Xpress SARS-CoV-2/FLU/RSV plus assay is intended as an aid in the diagnosis of influenza from Nasopharyngeal swab specimens and should not be used as a sole basis for treatment. Nasal washings and aspirates are unacceptable for Xpert Xpress SARS-CoV-2/FLU/RSV testing.  Fact Sheet for Patients: EntrepreneurPulse.com.au  Fact Sheet for Healthcare Providers: IncredibleEmployment.be  This test is not yet approved or cleared by the [place] FDA and has been authorized for detection and/or diagnosis of SARS-CoV-2 by FDA under an Emergency Use Authorization (EUA). This EUA will remain in effect (meaning this test can be used) for the duration of the COVID-19 declaration under Section 564(b)(1) of the Act, 21 U.S.C. section 360bbb-3(b)(1),  unless the authorization is terminated or revoked.  Performed at [facility], [place], [place] [address]   SARS CORONAVIRUS 2 (TAT 6-24 HRS) Nasopharyngeal Nasopharyngeal Swab     Status: None   Collection Time: 02/28/21  6:31 PM   Specimen: Nasopharyngeal Swab  Result Value  Ref Range Status   SARS Coronavirus 2 NEGATIVE NEGATIVE Final    Comment: (NOTE) SARS-CoV-2 target nucleic acids are NOT DETECTED.  The SARS-CoV-2 RNA is generally detectable in upper and lower respiratory specimens during the acute phase of infection. Negative results do not preclude SARS-CoV-2 infection, do not rule out co-infections with other pathogens, and should not be used as the sole basis for treatment or other patient management decisions. Negative results must be combined with clinical observations, patient history, and epidemiological information. The expected result is Negative.  Fact Sheet for Patients: SugarRoll.be  Fact Sheet for Healthcare Providers: https://www.woods-mathews.com/  This test is not yet approved or cleared by the [place] FDA and  has been authorized for detection and/or diagnosis of SARS-CoV-2 by FDA under an Emergency Use Authorization (EUA). This EUA will remain  in effect (meaning this test can be used) for the duration of the COVID-19 declaration under Se ction 564(b)(1) of the Act, 21 U.S.C. section 360bbb-3(b)(1), unless the authorization is terminated or revoked sooner.  Performed at [facility], [place] [address], [place], [place] [address]       Imaging Studies   No results found.   Medications   Scheduled Meds:  acetaminophen  1,000 mg Oral Q8H   aspirin EC  81 mg Oral Daily   calcitonin (salmon)  1 spray Alternating Nares Daily   carvedilol  10 mg Oral QHS   DULoxetine  60 mg Oral Daily   enoxaparin (LOVENOX) injection  0.5 mg/kg Subcutaneous Q24H   feeding supplement   237 mL Oral BID BM   gabapentin  200 mg Oral TID   lidocaine  1 patch Transdermal Q24H   losartan  25 mg Oral Daily   methocarbamol  500 mg Oral TID   multivitamin with minerals  1 tablet Oral Daily   rOPINIRole  0.25 mg Oral TID   traMADol  50 mg Oral Q8H   Continuous Infusions:     LOS: 3 days    Time spent: 30 minutes with > 50% spent at bedside and in coordination of care    Ezekiel Slocumb, DO [facility]  03/01/2021, 4:54 PM      If 7PM-7AM, please contact night-coverage. How to contact the [facility] Attending or Consulting provider [place] or covering provider during after hours [place], for this patient?    Check the care team in [facility] and look for a) attending/consulting [facility] provider listed and b) the [facility] team listed Log into [email or web address] and use [place]'s universal password to access. If you do not have the password, please contact the hospital operator. Locate the [facility] provider you are looking for under [facility] and page to a number that you can be directly reached. If you still have difficulty reaching the provider, please page the [facility] (Director on Call) for the Hospitalists listed on amion for assistance.

## 2021-03-01 NOTE — Progress Notes (Signed)
Pt. Orient and Alert. Pt. Experience increase anxiety and will not follow safety protocol even with sitter at bedside. Comfort/redirection provided to patient.  Pain management continues. Ativan 0.5mg  was prescribed PRN. A dose of Activin given, will continue to monitor patient.

## 2021-03-02 MED ORDER — BISACODYL 5 MG PO TBEC: 5.0000 mg | DELAYED_RELEASE_TABLET | Freq: Every day | ORAL | Status: DC | PRN

## 2021-03-02 MED ORDER — POLYETHYLENE GLYCOL 3350 17 G PO PACK
17.0000 g | PACK | Freq: Every day | ORAL | Status: DC
  Administered 2021-03-02 – 2021-03-06 (×4): 17 g via ORAL
  Filled 2021-03-02 (×5): qty 1

## 2021-03-02 MED ORDER — OXYCODONE HCL 5 MG PO TABS
5.0000 mg | ORAL_TABLET | Freq: Four times a day (QID) | ORAL | Status: DC | PRN
  Administered 2021-03-02 – 2021-03-06 (×12): 5 mg via ORAL
  Filled 2021-03-02 (×13): qty 1

## 2021-03-02 MED ORDER — LORAZEPAM 2 MG/ML IJ SOLN: 0.5000 mg | Freq: Three times a day (TID) | INTRAMUSCULAR | Status: DC | PRN

## 2021-03-02 MED ORDER — HYDROMORPHONE HCL 1 MG/ML IJ SOLN
0.5000 mg | INTRAMUSCULAR | Status: DC | PRN
  Administered 2021-03-02 – 2021-03-03 (×3): 0.5 mg via INTRAVENOUS
  Filled 2021-03-02 (×3): qty 1

## 2021-03-02 MED ORDER — TRAMADOL HCL 50 MG PO TABS
50.0000 mg | ORAL_TABLET | Freq: Two times a day (BID) | ORAL | Status: DC
  Administered 2021-03-02 – 2021-03-06 (×8): 50 mg via ORAL
  Filled 2021-03-02 (×8): qty 1

## 2021-03-02 MED ORDER — SENNOSIDES-DOCUSATE SODIUM 8.6-50 MG PO TABS
1.0000 | ORAL_TABLET | Freq: Two times a day (BID) | ORAL | Status: DC
  Administered 2021-03-02 – 2021-03-06 (×9): 1 via ORAL
  Filled 2021-03-02 (×9): qty 1

## 2021-03-02 MED ORDER — LORAZEPAM 0.5 MG PO TABS
0.5000 mg | ORAL_TABLET | Freq: Three times a day (TID) | ORAL | Status: DC | PRN
  Administered 2021-03-02: 0.5 mg via ORAL
  Filled 2021-03-02: qty 1

## 2021-03-02 NOTE — Plan of Care (Signed)
  Problem: Education: Goal: Knowledge of General Education information will improve Description: Including pain rating scale, medication(s)/side effects and non-pharmacologic comfort measures Outcome: Progressing   Problem: Health Behavior/Discharge Planning: Goal: Ability to manage health-related needs will improve Outcome: Progressing   Problem: Clinical Measurements: Goal: Ability to maintain clinical measurements within normal limits will improve Outcome: Progressing Goal: Will remain free from infection Outcome: Progressing   Problem: Activity: Goal: Risk for activity intolerance will decrease Outcome: Progressing   Problem: Nutrition: Goal: Adequate nutrition will be maintained Outcome: Progressing   Problem: Elimination: Goal: Will not experience complications related to bowel motility Outcome: Progressing Goal: Will not experience complications related to urinary retention Outcome: Progressing   Problem: Coping: Goal: Level of anxiety will decrease Outcome: Progressing

## 2021-03-02 NOTE — Progress Notes (Signed)
Pt A&Ox2. Pt copmliant with meds.  Pt c/o pain scheduled pain patch and oral meds given. BP (!) 139/59 (BP Location: Right Arm)   Pulse 60   Temp 98.5 F (36.9 C)   Resp (!) 21   Ht 5\' 4"  (1.626 m)   Wt 86.5 kg   SpO2 96%   BMI 32.73 kg/m  No other complaints at this time.  Hal Neer.RN

## 2021-03-02 NOTE — Progress Notes (Signed)
PROGRESS NOTE    Susan Salas   ZRA:[id number]  DOB: 08-04-1936  PCP: Adin Hector, MD    DOA: 02/25/2021 LOS: 4   Assessment & Plan   Principal Problem:   Closed compression fracture of L4 lumbar vertebra, initial encounter [facility]) Active Problems:   Low back pain   Chronic diastolic CHF (congestive heart failure) (HCC)   Hypertension   Osteoporosis   Hypothyroidism   Parkinsonism ([place])    Subacute L4 Compression fracture secondary to Osteoporosis MRI on 6/22 w/o signs cord compression.  S/p L4 kyphoplasty on 6/27 7/1 - adjusted meds slightly to hopefully not sedate her so much. - pain control tramadol standing, oxycodone prn, hydromorphone prn.  - continue calcitonin - added low dose gabapentin and Robaxin as it seems radicular and muscle spasm pain contributing and not helped by opioids adequately. - pt/ot advising SNF, placement pending - will need to be sitter-free x 24 hrs - is on prolia as outpt   Right groin pain - suspect radicular in nature No abnormality on physical exam. Ultrasound was negative for findings to explain, did show a normal appearing lymph node. --monitor for improvement on gabapentin   Left facial numbness - appears resolved. Reported 6/28 per prior attending: complained of sensation of numbness left side of face lateral to the nose. Normal neuro exam.   6/29 no report of this - monitor   Delirium - persistent - 2/2 medications, pain.   When pain uncontrolled, pt gets very restless, poor insight, tries to get up and is high fall risk. Improving - does okay when pain is controlled. Unfortunately, pain and anxiety control causing her to be overly sedated.   --Sitter PRN - avoid if at all possible (delays SNF d/c) ----Delirium precautions:     -Lights and TV off, minimize interruptions at night    -Blinds open and lights on during day    -Glasses/hearing aid with patient    -Frequent reorientation    -PT/OT when able    -Avoid  sedation medications as possible, but control pain as best we can  Generalized weakness / ambulatory dysfunction --PT and OT following Parkinson's like contributing. SNF d/c pending for rehab.   Hypothyroidism - home Synthroid   Hypertension - home losartan   Parkinsonism - home Requip  Obesity: Body mass index is 32.73 kg/m.  Complicates overall care and prognosis.  Recommend lifestyle modifications including physical activity and diet for weight loss and overall long-term health.   DVT prophylaxis: SCDs Start: 02/26/21 0048   Diet:  Diet Orders (From admission, onward)     Start     Ordered   02/26/21 1537  Diet regular Room service appropriate? Yes; Fluid consistency: Thin  Diet effective now       Question Answer Comment  Room service appropriate? Yes   Fluid consistency: Thin      02/26/21 1536              Code Status: Full Code   Brief Narrative / Hospital Course to Date:   85 yo female with past hx of HTN, Parkinson's, back pain, presenting for pain control and kyphoplasty for a subacute L4 compression fracture after sustaining a mechanical fall 2-3 weeks ago.    Subjective 03/02/21    Patient sleeping and snoring. Husband at bedside, reports "they just keep her like this."  Says she woke just briefly to try to eat earlier, otherwise has been asleep all morning.  She did get Ativan  overnight.  No more IV pain medication used.  Patient currently denying pain.  PT reported patient much weaker in her knees today when working with her.  Knees buckle, she stood up only briefly today, has done better in their previous sessions.   Disposition Plan & Communication   Status is: Inpatient  Remains inpatient appropriate because: requires SNF placement which is pending, adjusting medications for adequate pain control without oversedated her.  Dispo: The patient is from: Home              Anticipated d/c is to: SNF              Patient currently is not medically  stable to d/c.   Difficult to place patient No  Family Communication: Husband at bedside on rounds   Consults, Procedures, Significant Events   Consultants:  Orthopedic surgery  Procedures:  Kyphoplasty  Antimicrobials:  Anti-infectives (From admission, onward)    Start     Dose/Rate Route Frequency Ordered Stop   02/26/21 1145  ceFAZolin (ANCEF) 2-4 GM/100ML-% IVPB       Note to Pharmacy: Maryagnes Amos   : cabinet override      02/26/21 1145 02/26/21 1238   02/26/21 0600  ceFAZolin (ANCEF) IVPB 2g/100 mL premix        2 g 200 mL/hr over 30 Minutes Intravenous On call to O.R. 02/26/21 0022 02/26/21 1221         Micro    Objective   Vitals:   03/01/21 2346 03/02/21 0407 03/02/21 0846 03/02/21 1134  BP: (!) 158/83 (!) 153/99 (!) 155/87 127/74  Pulse: 75 75 76 70  Resp: 17 17 17 17   Temp: 97.7 F (36.5 C) 98.2 F (36.8 C) 98.4 F (36.9 C) 98 F (36.7 C)  TempSrc:   Oral   SpO2: 97% 100% 98% 96%  Weight:      Height:        Intake/Output Summary (Last 24 hours) at 03/02/2021 1722 Last data filed at 03/01/2021 1850 Gross per 24 hour  Intake 0 ml  Output --  Net 0 ml   Filed Weights   02/25/21 2022 02/28/21 0500  Weight: 90.3 kg 86.5 kg    Physical Exam:  General exam: somnolent and snoring, walk easily but does not stay awake and engaged, no acute distress Respiratory system: normal respiratory effort, on room air. Cardiovascular system: RRR, no peripheral edema Gastrointestinal system: soft, NT, ND Central nervous system: no gross focal neurologic deficits, normal speech, somnolent  Labs   Data Reviewed: I have personally reviewed following labs and imaging studies  CBC: Recent Labs  Lab 02/25/21 2050 02/26/21 0739 02/27/21 0507  WBC 9.2 11.3* 9.0  NEUTROABS 6.5  --   --   HGB 15.1* 14.8 13.9  HCT 44.9 44.5 42.3  MCV 93.5 92.7 93.0  PLT 287 315 440   Basic Metabolic Panel: Recent Labs  Lab 02/25/21 2050 02/26/21 0739  02/27/21 0507 03/01/21 0446  NA 140 143 142 139  K 3.8 3.6 3.5 3.6  CL 101 106 104 99  CO2 31 29 30 31   GLUCOSE 120* 110* 117* 101*  BUN 23 16 14 15   CREATININE 0.74 0.68 0.62 0.60  CALCIUM 9.9 9.9 9.6 9.7  MG  --  2.5*  --   --    GFR: Estimated Creatinine Clearance: 56.7 mL/min (by C-G formula based on SCr of 0.6 mg/dL). Liver Function Tests: Recent Labs  Lab 02/25/21 2050 02/26/21 0739  AST  21 28  ALT 27 32  ALKPHOS 113 113  BILITOT 1.1 1.4*  PROT 7.4 7.3  ALBUMIN 4.4 4.3   Recent Labs  Lab 02/25/21 2050  LIPASE 25   No results for input(s): AMMONIA in the last 168 hours. Coagulation Profile: Recent Labs  Lab 02/26/21 0739  INR 1.0   Cardiac Enzymes: No results for input(s): CKTOTAL, CKMB, CKMBINDEX, TROPONINI in the last 168 hours. BNP (last 3 results) No results for input(s): PROBNP in the last 8760 hours. HbA1C: No results for input(s): HGBA1C in the last 72 hours. CBG: No results for input(s): GLUCAP in the last 168 hours. Lipid Profile: No results for input(s): CHOL, HDL, LDLCALC, TRIG, CHOLHDL, LDLDIRECT in the last 72 hours. Thyroid Function Tests: No results for input(s): TSH, T4TOTAL, FREET4, T3FREE, THYROIDAB in the last 72 hours. Anemia Panel: No results for input(s): VITAMINB12, FOLATE, FERRITIN, TIBC, IRON, RETICCTPCT in the last 72 hours. Sepsis Labs: No results for input(s): PROCALCITON, LATICACIDVEN in the last 168 hours.  Recent Results (from the past 240 hour(s))  Resp Panel by RT-PCR (Flu A&B, Covid)     Status: None   Collection Time: 02/26/21  2:22 AM   Specimen: Nasopharyngeal(NP) swabs in vial transport medium  Result Value Ref Range Status   SARS Coronavirus 2 by RT PCR NEGATIVE NEGATIVE Final    Comment: (NOTE) SARS-CoV-2 target nucleic acids are NOT DETECTED.  The SARS-CoV-2 RNA is generally detectable in upper respiratory specimens during the acute phase of infection. The lowest concentration of SARS-CoV-2 viral copies  this assay can detect is 138 copies/mL. A negative result does not preclude SARS-Cov-2 infection and should not be used as the sole basis for treatment or other patient management decisions. A negative result may occur with  improper specimen collection/handling, submission of specimen other than nasopharyngeal swab, presence of viral mutation(s) within the areas targeted by this assay, and inadequate number of viral copies(<138 copies/mL). A negative result must be combined with clinical observations, patient history, and epidemiological information. The expected result is Negative.  Fact Sheet for Patients:  EntrepreneurPulse.com.au  Fact Sheet for Healthcare Providers:  IncredibleEmployment.be  This test is no t yet approved or cleared by the [place] FDA and  has been authorized for detection and/or diagnosis of SARS-CoV-2 by FDA under an Emergency Use Authorization (EUA). This EUA will remain  in effect (meaning this test can be used) for the duration of the COVID-19 declaration under Section 564(b)(1) of the Act, 21 U.S.C.section 360bbb-3(b)(1), unless the authorization is terminated  or revoked sooner.       Influenza A by PCR NEGATIVE NEGATIVE Final   Influenza B by PCR NEGATIVE NEGATIVE Final    Comment: (NOTE) The Xpert Xpress SARS-CoV-2/FLU/RSV plus assay is intended as an aid in the diagnosis of influenza from Nasopharyngeal swab specimens and should not be used as a sole basis for treatment. Nasal washings and aspirates are unacceptable for Xpert Xpress SARS-CoV-2/FLU/RSV testing.  Fact Sheet for Patients: EntrepreneurPulse.com.au  Fact Sheet for Healthcare Providers: IncredibleEmployment.be  This test is not yet approved or cleared by the [place] FDA and has been authorized for detection and/or diagnosis of SARS-CoV-2 by FDA under an Emergency Use Authorization (EUA). This EUA  will remain in effect (meaning this test can be used) for the duration of the COVID-19 declaration under Section 564(b)(1) of the Act, 21 U.S.C. section 360bbb-3(b)(1), unless the authorization is terminated or revoked.  Performed at [facility], [address] [place],  [place] [address]   SARS CORONAVIRUS 2 (TAT 6-24 HRS) Nasopharyngeal Nasopharyngeal Swab     Status: None   Collection Time: 02/28/21  6:31 PM   Specimen: Nasopharyngeal Swab  Result Value Ref Range Status   SARS Coronavirus 2 NEGATIVE NEGATIVE Final    Comment: (NOTE) SARS-CoV-2 target nucleic acids are NOT DETECTED.  The SARS-CoV-2 RNA is generally detectable in upper and lower respiratory specimens during the acute phase of infection. Negative results do not preclude SARS-CoV-2 infection, do not rule out co-infections with other pathogens, and should not be used as the sole basis for treatment or other patient management decisions. Negative results must be combined with clinical observations, patient history, and epidemiological information. The expected result is Negative.  Fact Sheet for Patients: SugarRoll.be  Fact Sheet for Healthcare Providers: https://www.woods-mathews.com/  This test is not yet approved or cleared by the [place] FDA and  has been authorized for detection and/or diagnosis of SARS-CoV-2 by FDA under an Emergency Use Authorization (EUA). This EUA will remain  in effect (meaning this test can be used) for the duration of the COVID-19 declaration under Se ction 564(b)(1) of the Act, 21 U.S.C. section 360bbb-3(b)(1), unless the authorization is terminated or revoked sooner.  Performed at [facility], [place] [address] [place], [place] [address]       Imaging Studies   No results found.   Medications   Scheduled Meds:  acetaminophen  1,000 mg Oral Q8H   aspirin EC  81 mg Oral Daily   calcitonin (salmon)  1 spray  Alternating Nares Daily   carvedilol  10 mg Oral QHS   DULoxetine  60 mg Oral Daily   enoxaparin (LOVENOX) injection  0.5 mg/kg Subcutaneous Q24H   feeding supplement  237 mL Oral BID BM   gabapentin  200 mg Oral TID   lidocaine  1 patch Transdermal Q24H   losartan  25 mg Oral Daily   methocarbamol  500 mg Oral TID   multivitamin with minerals  1 tablet Oral Daily   polyethylene glycol  17 g Oral Daily   rOPINIRole  0.25 mg Oral TID   senna-docusate  1 tablet Oral BID   traMADol  50 mg Oral Q12H   Continuous Infusions:     LOS: 4 days    Time spent: 30 minutes with > 50% spent at bedside and in coordination of care    Ezekiel Slocumb, DO [facility]  03/02/2021, 5:22 PM      If 7PM-7AM, please contact night-coverage. How to contact the [facility] Attending or Consulting provider [place] or covering provider during after hours [place], for this patient?    Check the care team in [facility] and look for a) attending/consulting [facility] provider listed and b) the [facility] team listed Log into [email or web address] and use [place]'s universal password to access. If you do not have the password, please contact the hospital operator. Locate the [facility] provider you are looking for under [facility] and page to a number that you can be directly reached. If you still have difficulty reaching the provider, please page the [facility] (Director on Call) for the Hospitalists listed on amion for assistance.

## 2021-03-02 NOTE — Progress Notes (Signed)
Occupational Therapy Treatment Patient Details Name: Susan Salas MRN: [id number] DOB: 05-May-1936 Today's Date: 03/02/2021    History of present illness 85 y/o female who had a fall ~3 weeks ago, now s/p L4 kyphoplasty 6/27.   Medical history significant for chronic diastolic heart failure, essential pretension, acquired hypothyroidism, osteoporosis, Parkinsonin like symptoms.   OT comments  Pt seen for OT tx this date. Pt sleeping, requiring sternal rub to improve alertness. Pt tolerated sitting at least 20min for breakfast, requiring assist for balance, bringing spoon to mouth, and intermittent cues to keep eyes open and to interact with spouse present. Pt performed STS and SPT to [facility] with mod-max A +2 to perform + RW, MAX A for pericare while assist provided to maintain standing with RW. Difficulty with initiating movement, shuffling steps. Pt demonstrating need for increased assist this date from previous sessions. Pt continues to benefit from skilled OT services and continue to recommend SNF for short term rehab at this time. Pt unsafe to return home, as spouse is unable to provide level of care.    Follow Up Recommendations  SNF;Supervision - Intermittent    Equipment Recommendations  3 in 1 bedside commode    Recommendations for Other Services      Precautions / Restrictions Precautions Precautions: Fall Restrictions Weight Bearing Restrictions: No       Mobility Bed Mobility Overal bed mobility: Needs Assistance Bed Mobility: Supine to Sit     Supine to sit: Max assist;HOB elevated          Transfers Overall transfer level: Needs assistance Equipment used: Rolling walker (2 wheeled) Transfers: Sit to/from Stand Sit to Stand: +2 physical assistance;Mod assist;Max assist;From elevated surface         General transfer comment: cues sequencing, big steps    Balance Overall balance assessment: Needs assistance Sitting-balance support: Feet supported;No upper  extremity supported Sitting balance-Leahy Scale: Poor Sitting balance - Comments: R lateral and slight posterior leans, improves with pillow support to R side and PRN MIN A and cues for improved upright positioning   Standing balance support: Bilateral upper extremity supported;During functional activity Standing balance-Leahy Scale: Poor Standing balance comment: requires significant assist                           ADL either performed or assessed with clinical judgement   ADL Overall ADL's : Needs assistance/impaired Eating/Feeding: Sitting;Minimal assistance;Set up Eating/Feeding Details (indicate cue type and reason): seated EOB, set up assist, cues and PRN MIN A to maintain sitting balance and alertness, MIN A at times to support bringing spoon to mouth as she had tendency to get ~80% there but difficulty reaching her mouth and dropping food Grooming: Sitting;Set up;Supervision/safety;Wash/dry face Grooming Details (indicate cue type and reason): cues to perform                 Toilet Transfer: RW;BSC;Stand-pivot;Moderate assistance;Maximal assistance;+2 for physical assistance Toilet Transfer Details (indicate cue type and reason): cues for large steps, shuffling, difficulty initiating Toileting- Clothing Manipulation and Hygiene: Sit to/from stand;Maximal assistance;+2 for physical assistance Toileting - Clothing Manipulation Details (indicate cue type and reason): +2 assist for standing with RW and MAX A for pericare             Vision       Perception     Praxis      Cognition Arousal/Alertness: Lethargic;Suspect due to medications Behavior During Therapy: Flat affect Overall Cognitive Status:  History of cognitive impairments - at baseline                                 General Comments: oriented to self and hospital, very lethargic, keeps eyes closed most of the time, requires intermittent cues to improve alertness         Exercises Other Exercises Other Exercises: Pt tolerated sitting at least 28min for breakfast, requiring assist for balance, bringing spoon to mouth, and intermittent cues to keep eyes open and to interact with spouse present; STS and SPT to [facility] with mod-max A +2 to perform + RW   Shoulder Instructions       General Comments      Pertinent Vitals/ Pain       Pain Assessment: Faces Faces Pain Scale: Hurts little more Pain Location: RLE initially but with sitting ~30 seconds resolves Pain Descriptors / Indicators: Aching;Crying;Grimacing;Guarding;Sore Pain Intervention(s): Limited activity within patient's tolerance;Monitored during session;Repositioned;Patient requesting pain meds-RN notified  Home Living                                          Prior Functioning/Environment              Frequency  Min 1X/week        Progress Toward Goals  OT Goals(current goals can now be found in the care plan section)  Progress towards OT goals: Not progressing toward goals - comment (requiring increased assist in all aspects, decreased alertness)  Acute Rehab OT Goals Patient Stated Goal: none stated OT Goal Formulation: With patient Time For Goal Achievement: 03/13/21 Potential to Achieve Goals: [place] Discharge plan remains appropriate;Frequency remains appropriate    Co-evaluation                 AM-PAC OT "6 Clicks" Daily Activity     Outcome Measure   Help from another person eating meals?: A Little Help from another person taking care of personal grooming?: A Little Help from another person toileting, which includes using toliet, bedpan, or urinal?: Total Help from another person bathing (including washing, rinsing, drying)?: A Lot Help from another person to put on and taking off regular upper body clothing?: A Lot Help from another person to put on and taking off regular lower body clothing?: A Lot 6 Click Score: 13    End of Session  Equipment Utilized During Treatment: Gait belt;Rolling walker  OT Visit Diagnosis: Other abnormalities of gait and mobility (R26.89);Pain;History of falling (Z91.81) Pain - Right/Left: Right Pain - part of body: Hip;Leg   Activity Tolerance Patient tolerated treatment well   Patient Left in bed;with call bell/phone within reach;with family/visitor present;Other (comment) (seated EOB with PT)   Nurse Communication Patient requests pain meds        Time: 4403-4742 OT Time Calculation (min): 54 min  Charges: OT General Charges $OT Visit: 1 Visit OT Treatments $Self Care/Home Management : 53-67 mins  Hanley Hays, MPH, MS, OTR/L ascom [phone] 03/02/21, 1:31 PM

## 2021-03-03 DIAGNOSIS — G20 Parkinson's disease: Secondary | ICD-10-CM

## 2021-03-03 LAB — BASIC METABOLIC PANEL
Anion gap: 10 (ref 5–15)
BUN: 15 mg/dL (ref 8–23)
CO2: 30 mmol/L (ref 22–32)
Calcium: 9.8 mg/dL (ref 8.9–10.3)
Chloride: 100 mmol/L (ref 98–111)
Creatinine, Ser: 0.52 mg/dL (ref 0.44–1.00)
GFR, Estimated: >60 mL/min (ref >60)
Glucose, Bld: 100 mg/dL — ABNORMAL HIGH (ref 70–99)
Potassium: 3.5 mmol/L (ref 3.5–5.1)
Sodium: 140 mmol/L (ref 135–145)

## 2021-03-03 MED ORDER — LEVOTHYROXINE SODIUM 88 MCG PO TABS
88.0000 ug | ORAL_TABLET | Freq: Every day | ORAL | Status: DC
  Administered 2021-03-04 – 2021-03-06 (×3): 88 ug via ORAL
  Filled 2021-03-03 (×3): qty 1

## 2021-03-03 MED ORDER — VITAMIN D (ERGOCALCIFEROL) 1.25 MG (50000 UNIT) PO CAPS
50000.0000 [IU] | ORAL_CAPSULE | ORAL | Status: DC
  Administered 2021-03-04: 50000 [IU] via ORAL
  Filled 2021-03-03 (×2): qty 1

## 2021-03-03 MED ORDER — VITAMIN E 45 MG (100 UNIT) PO CAPS
100.0000 [IU] | ORAL_CAPSULE | Freq: Every day | ORAL | Status: DC
  Administered 2021-03-03 – 2021-03-06 (×4): 100 [IU] via ORAL
  Filled 2021-03-03 (×4): qty 1

## 2021-03-03 MED ORDER — BACLOFEN 10 MG PO TABS
5.0000 mg | ORAL_TABLET | Freq: Two times a day (BID) | ORAL | Status: DC
  Administered 2021-03-03 – 2021-03-06 (×6): 5 mg via ORAL
  Filled 2021-03-03 (×7): qty 0.5

## 2021-03-03 MED ORDER — ADULT MULTIVITAMIN W/MINERALS CH: 1.0000 | ORAL_TABLET | Freq: Every day | ORAL | Status: DC

## 2021-03-03 MED ORDER — GABAPENTIN 300 MG PO CAPS
300.0000 mg | ORAL_CAPSULE | Freq: Three times a day (TID) | ORAL | Status: DC
  Administered 2021-03-03 – 2021-03-05 (×5): 300 mg via ORAL
  Filled 2021-03-03 (×5): qty 1

## 2021-03-03 MED ORDER — CALCIUM CARBONATE-VITAMIN D 500-200 MG-UNIT PO TABS
1.0000 | ORAL_TABLET | Freq: Two times a day (BID) | ORAL | Status: DC
  Administered 2021-03-04 – 2021-03-06 (×5): 1 via ORAL
  Filled 2021-03-03 (×5): qty 1

## 2021-03-03 NOTE — Progress Notes (Signed)
Physical Therapy Treatment Patient Details Name: Susan Salas MRN: [id number] DOB: October 28, 1935 Today's Date: 03/03/2021    History of Present Illness 85 y/o female who had a fall ~3 weeks ago, now s/p L4 kyphoplasty 6/27.   Medical history significant for chronic diastolic heart failure, essential pretension, acquired hypothyroidism, osteoporosis, Parkinsonin like symptoms.    PT Comments    Pt was received sitting on BSC finishing OT session upon arrival. She present with lethargy however is able to communicate throughout limited session. Oriented to self but disoriented x 3 with increased processing time and time required to perform all desired task. Pt has parkinson's disease with more acute symptoms than previously observed past few days. More difficulty with all mobility, transfers, and gait. Pt has severe knee buckling. Recommend +2 assistance for any/all transfers. Do not currently recommend RN staff attempt ambulation. +2 Stand pivot to/from [facility] most appropriate. MD aware of progressive symptoms. Did mention change in medications over past 24 hours that may be contributing to symptoms. Lengthy discussion with pt/pt's spouse about PT going forward and amount of assistance pt may be requiring for the foreseeable future. Spouse understands he is unable to care for pt at home. Highly recommend SNF at DC. Acute PT will continue to follow per current POC.   Follow Up Recommendations  SNF     Equipment Recommendations  None recommended by PT       Precautions / Restrictions Precautions Precautions: Fall (High fall risk)    Mobility  Bed Mobility Overal bed mobility: Needs Assistance Bed Mobility: Sit to Supine     Supine to sit: Mod assist;Max assist     General bed mobility comments: Pt required more assistance to return to bed than previous date    Transfers Overall transfer level: Needs assistance Equipment used: Rolling walker (2 wheeled) Transfers: Sit to/from Stand Sit  to Stand: Max assist         General transfer comment: Pt required max assist to transfer today 2/2 knee buckling. MD made aware. medications were adjusted previous date  Ambulation/Gait Ambulation/Gait assistance: Max assist   Assistive device: Rolling walker (2 wheeled) Gait Pattern/deviations: Shuffle (knee buckling) Gait velocity: decreased   General Gait Details: Pt has much more sever knee buckling/shuffle today. Unsafe to ambulate further distances due to poor standing safety/high fall risk     Balance Overall balance assessment: Needs assistance Sitting-balance support: Feet supported;No upper extremity supported Sitting balance-Leahy Scale: Poor Sitting balance - Comments: pt with more severe R lateral lean in sitting   Standing balance support: Bilateral upper extremity supported;During functional activity Standing balance-Leahy Scale: Zero Standing balance comment: constant max assist due to knee buckling      Cognition Arousal/Alertness: Lethargic;Suspect due to medications Behavior During Therapy: Anxious;WFL for tasks assessed/performed Overall Cognitive Status: History of cognitive impairments - at baseline      General Comments: pt is much more confused today.Oriented to self only. Inconsistently follows one step commands.             Pertinent Vitals/Pain Faces Pain Scale: Hurts a little bit Pain Location: Low back/RLE Pain Descriptors / Indicators: Crying;Grimacing;Guarding;Sore     PT Goals (current goals can now be found in the care plan section) Acute Rehab PT Goals Patient Stated Goal: none stated    Frequency    7X/week      PT Plan Current plan remains appropriate       AM-PAC PT "6 Clicks" Mobility   Outcome Measure  Help  needed turning from your back to your side while in a flat bed without using bedrails?: A Lot Help needed moving from lying on your back to sitting on the side of a flat bed without using bedrails?: A  Lot Help needed moving to and from a bed to a chair (including a wheelchair)?: A Lot Help needed standing up from a chair using your arms (e.g., wheelchair or bedside chair)?: A Lot Help needed to walk in hospital room?: Total Help needed climbing 3-5 steps with a railing? : Total 6 Click Score: 10    End of Session Equipment Utilized During Treatment: Gait belt Activity Tolerance: Patient limited by lethargy Patient left: in bed;with call bell/phone within reach;with bed alarm set;with family/visitor present Nurse Communication: Mobility status PT Visit Diagnosis: Unsteadiness on feet (R26.81);Muscle weakness (generalized) (M62.81);Difficulty in walking, not elsewhere classified (R26.2);Pain     Time:  -     Charges:                        Julaine Fusi PTA 03/03/21, 8:57 AM

## 2021-03-03 NOTE — Progress Notes (Signed)
PROGRESS NOTE    Susan Salas   OVF:[id number]  DOB: [birth year]  PCP: Adin Hector, MD    DOA: 02/25/2021 LOS: 5   Assessment & Plan   Principal Problem:   Closed compression fracture of L4 lumbar vertebra, initial encounter [facility]) Active Problems:   Low back pain   Chronic diastolic CHF (congestive heart failure) (HCC)   Hypertension   Osteoporosis   Hypothyroidism   Parkinsonism ([place])    Subacute L4 Compression fracture secondary to Osteoporosis MRI on 6/22 w/o signs cord compression.  S/p L4 kyphoplasty on 6/27 7/1 - adjusted meds slightly to hopefully not sedate her so much. 7/2 - severe radicular & spasm type pain in R leg, not as sedated today  - pain control tramadol standing, oxycodone prn, hydromorphone prn.  - continue calcitonin - increase gabapentin 200>>300 mg TID (new) - stop Robaxin and resume home Baclofen seems radicular and muscle spasm pain contributing and not helped by opioids adequately. - pt/ot advising SNF, placement pending - once adequate pain control  - is on prolia as outpt   Lumbar radiculopathy due to severe foraminal stenosis - MRI reviewed.  Consulted neurosurgery - given her age, would need a DEXA scan before considering an elective large fusion. If its acute pain, can recommend  a steroid taper. --continue gabapentin (new this adm) -- home baclofen --pain control as above --cont PT, d/c to SNF once adequate symptoms control  Right groin pain - suspect radicular in nature No abnormality on physical exam. Ultrasound was negative for findings to explain, did show a normal appearing lymph node. --monitor for improvement on gabapentin   Left facial numbness - appears resolved. Reported 6/28 per prior attending: complained of sensation of numbness left side of face lateral to the nose. Normal neuro exam.   6/29 no report of this - monitor   Delirium - persistent - 2/2 medications, pain.   When pain uncontrolled, pt gets very  restless, poor insight, tries to get up and is high fall risk. Improving - does okay when pain is controlled. Unfortunately, pain and anxiety control causing her to be overly sedated.   --Sitter PRN - avoid if at all possible (delays SNF d/c) ----Delirium precautions:     -Lights and TV off, minimize interruptions at night    -Blinds open and lights on during day    -Glasses/hearing aid with patient    -Frequent reorientation    -PT/OT when able    -Avoid sedation medications as possible, but control pain as best we can  Generalized weakness / ambulatory dysfunction --PT and OT following Parkinson's like contributing. SNF d/c pending for rehab.   Hypothyroidism - home Synthroid   Hypertension - home losartan   Parkinsonism - home Requip. Follows with Dr. Manuella Ghazi, neurology. Had been started on Sinemet but d/c'd on her own due to side effects and did not note improvementn.  Obesity: Body mass index is 31.72 kg/m.  Complicates overall care and prognosis.  Recommend lifestyle modifications including physical activity and diet for weight loss and overall long-term health.   DVT prophylaxis: SCDs Start: 02/26/21 0048   Diet:  Diet Orders (From admission, onward)     Start     Ordered   02/26/21 1537  Diet regular Room service appropriate? Yes; Fluid consistency: Thin  Diet effective now       Question Answer Comment  Room service appropriate? Yes   Fluid consistency: Thin      02/26/21 1536  Code Status: Full Code   Brief Narrative / Hospital Course to Date:   85 yo female with past hx of HTN, Parkinson's, back pain, presenting for pain control and kyphoplasty for a subacute L4 compression fracture after sustaining a mechanical fall 2-3 weeks ago.    Subjective 03/03/21    Patient awake laying in bed, having severe right leg pain just starting.  Husband at bedside, rubbing her leg, and lifts it up for her at times.  She writhes in pain intermittently.   She reports right leg very weak this AM, and severe pain wraps around lateral thigh, feels like cramping.    Re Sinemet - she stopped taking it due to side effects, states it made her feel very weird and without any improvement in parkinsonism symptoms.     Disposition Plan & Communication   Status is: Inpatient  Remains inpatient appropriate because: requires SNF placement which is pending, but pain remains uncontrolled without oversedating the patient  Dispo: The patient is from: Home              Anticipated d/c is to: SNF              Patient currently is not medically stable to d/c.   Difficult to place patient No  Family Communication: Husband at bedside on rounds   Consults, Procedures, Significant Events   Consultants:  Orthopedic surgery  Procedures:  Kyphoplasty  Antimicrobials:  Anti-infectives (From admission, onward)    Start     Dose/Rate Route Frequency Ordered Stop   02/26/21 1145  ceFAZolin (ANCEF) 2-4 GM/100ML-% IVPB       Note to Pharmacy: Maryagnes Amos   : cabinet override      02/26/21 1145 02/26/21 1238   02/26/21 0600  ceFAZolin (ANCEF) IVPB 2g/100 mL premix        2 g 200 mL/hr over 30 Minutes Intravenous On call to O.R. 02/26/21 0022 02/26/21 1221         Micro    Objective   Vitals:   03/03/21 0500 03/03/21 0505 03/03/21 0920 03/03/21 1636  BP:  (!) 150/68 (!) 155/88 (!) 170/93  Pulse:  76 75 87  Resp:  16 16 16   Temp:  98 F (36.7 C) 98.4 F (36.9 C) 98.8 F (37.1 C)  TempSrc:      SpO2:  93% 95% 96%  Weight: 83.8 kg     Height:        Intake/Output Summary (Last 24 hours) at 03/03/2021 1912 Last data filed at 03/03/2021 1843 Gross per 24 hour  Intake 480 ml  Output --  Net 480 ml   Filed Weights   02/25/21 2022 02/28/21 0500 03/03/21 0500  Weight: 90.3 kg 86.5 kg 83.8 kg    Physical Exam:  General exam: awake, eyes closed, wincing frequently, intermittently in distress with pain Respiratory system: CTAB, normal  respiratory effort, on room air, no wheezes or rhonchi Cardiovascular system: RRR, no peripheral edema Gastrointestinal system: soft, NT, ND Central nervous system: A&Ox3, no gross focal neurologic deficits, normal speech, somnolent Extremities: moves all, normal tone, no edema, unable to appreciate areas of hypertonicity in right thigh areas of pain  Labs   Data Reviewed: I have personally reviewed following labs and imaging studies  CBC: Recent Labs  Lab 02/25/21 2050 02/26/21 0739 02/27/21 0507  WBC 9.2 11.3* 9.0  NEUTROABS 6.5  --   --   HGB 15.1* 14.8 13.9  HCT 44.9 44.5 42.3  MCV 93.5  92.7 93.0  PLT 287 315 902   Basic Metabolic Panel: Recent Labs  Lab 02/25/21 2050 02/26/21 0739 02/27/21 0507 03/01/21 0446 03/03/21 0526  NA 140 143 142 139 140  K 3.8 3.6 3.5 3.6 3.5  CL 101 106 104 99 100  CO2 31 29 30 31 30   GLUCOSE 120* 110* 117* 101* 100*  BUN 23 16 14 15 15   CREATININE 0.74 0.68 0.62 0.60 0.52  CALCIUM 9.9 9.9 9.6 9.7 9.8  MG  --  2.5*  --   --   --    GFR: Estimated Creatinine Clearance: 55.8 mL/min (by C-G formula based on SCr of 0.52 mg/dL). Liver Function Tests: Recent Labs  Lab 02/25/21 2050 02/26/21 0739  AST 21 28  ALT 27 32  ALKPHOS 113 113  BILITOT 1.1 1.4*  PROT 7.4 7.3  ALBUMIN 4.4 4.3   Recent Labs  Lab 02/25/21 2050  LIPASE 25   No results for input(s): AMMONIA in the last 168 hours. Coagulation Profile: Recent Labs  Lab 02/26/21 0739  INR 1.0   Cardiac Enzymes: No results for input(s): CKTOTAL, CKMB, CKMBINDEX, TROPONINI in the last 168 hours. BNP (last 3 results) No results for input(s): PROBNP in the last 8760 hours. HbA1C: No results for input(s): HGBA1C in the last 72 hours. CBG: No results for input(s): GLUCAP in the last 168 hours. Lipid Profile: No results for input(s): CHOL, HDL, LDLCALC, TRIG, CHOLHDL, LDLDIRECT in the last 72 hours. Thyroid Function Tests: No results for input(s): TSH, T4TOTAL, FREET4,  T3FREE, THYROIDAB in the last 72 hours. Anemia Panel: No results for input(s): VITAMINB12, FOLATE, FERRITIN, TIBC, IRON, RETICCTPCT in the last 72 hours. Sepsis Labs: No results for input(s): PROCALCITON, LATICACIDVEN in the last 168 hours.  Recent Results (from the past 240 hour(s))  Resp Panel by RT-PCR (Flu A&B, Covid)     Status: None   Collection Time: 02/26/21  2:22 AM   Specimen: Nasopharyngeal(NP) swabs in vial transport medium  Result Value Ref Range Status   SARS Coronavirus 2 by RT PCR NEGATIVE NEGATIVE Final    Comment: (NOTE) SARS-CoV-2 target nucleic acids are NOT DETECTED.  The SARS-CoV-2 RNA is generally detectable in upper respiratory specimens during the acute phase of infection. The lowest concentration of SARS-CoV-2 viral copies this assay can detect is 138 copies/mL. A negative result does not preclude SARS-Cov-2 infection and should not be used as the sole basis for treatment or other patient management decisions. A negative result may occur with  improper specimen collection/handling, submission of specimen other than nasopharyngeal swab, presence of viral mutation(s) within the areas targeted by this assay, and inadequate number of viral copies(<138 copies/mL). A negative result must be combined with clinical observations, patient history, and epidemiological information. The expected result is Negative.  Fact Sheet for Patients:  EntrepreneurPulse.com.au  Fact Sheet for Healthcare Providers:  IncredibleEmployment.be  This test is no t yet approved or cleared by the [place] FDA and  has been authorized for detection and/or diagnosis of SARS-CoV-2 by FDA under an Emergency Use Authorization (EUA). This EUA will remain  in effect (meaning this test can be used) for the duration of the COVID-19 declaration under Section 564(b)(1) of the Act, 21 U.S.C.section 360bbb-3(b)(1), unless the authorization is terminated  or  revoked sooner.       Influenza A by PCR NEGATIVE NEGATIVE Final   Influenza B by PCR NEGATIVE NEGATIVE Final    Comment: (NOTE) The Xpert Xpress SARS-CoV-2/FLU/RSV plus assay is  intended as an aid in the diagnosis of influenza from Nasopharyngeal swab specimens and should not be used as a sole basis for treatment. Nasal washings and aspirates are unacceptable for Xpert Xpress SARS-CoV-2/FLU/RSV testing.  Fact Sheet for Patients: EntrepreneurPulse.com.au  Fact Sheet for Healthcare Providers: IncredibleEmployment.be  This test is not yet approved or cleared by the [place] FDA and has been authorized for detection and/or diagnosis of SARS-CoV-2 by FDA under an Emergency Use Authorization (EUA). This EUA will remain in effect (meaning this test can be used) for the duration of the COVID-19 declaration under Section 564(b)(1) of the Act, 21 U.S.C. section 360bbb-3(b)(1), unless the authorization is terminated or revoked.  Performed at [facility], [place], [place] [address]   SARS CORONAVIRUS 2 (TAT 6-24 HRS) Nasopharyngeal Nasopharyngeal Swab     Status: None   Collection Time: 02/28/21  6:31 PM   Specimen: Nasopharyngeal Swab  Result Value Ref Range Status   SARS Coronavirus 2 NEGATIVE NEGATIVE Final    Comment: (NOTE) SARS-CoV-2 target nucleic acids are NOT DETECTED.  The SARS-CoV-2 RNA is generally detectable in upper and lower respiratory specimens during the acute phase of infection. Negative results do not preclude SARS-CoV-2 infection, do not rule out co-infections with other pathogens, and should not be used as the sole basis for treatment or other patient management decisions. Negative results must be combined with clinical observations, patient history, and epidemiological information. The expected result is Negative.  Fact Sheet for Patients: SugarRoll.be  Fact  Sheet for Healthcare Providers: https://www.woods-mathews.com/  This test is not yet approved or cleared by the [place] FDA and  has been authorized for detection and/or diagnosis of SARS-CoV-2 by FDA under an Emergency Use Authorization (EUA). This EUA will remain  in effect (meaning this test can be used) for the duration of the COVID-19 declaration under Se ction 564(b)(1) of the Act, 21 U.S.C. section 360bbb-3(b)(1), unless the authorization is terminated or revoked sooner.  Performed at [facility], [place] [address] [place], [place] [address]       Imaging Studies   No results found.   Medications   Scheduled Meds:  acetaminophen  1,000 mg Oral Q8H   aspirin EC  81 mg Oral Daily   calcitonin (salmon)  1 spray Alternating Nares Daily   carvedilol  10 mg Oral QHS   DULoxetine  60 mg Oral Daily   enoxaparin (LOVENOX) injection  0.5 mg/kg Subcutaneous Q24H   feeding supplement  237 mL Oral BID BM   gabapentin  200 mg Oral TID   lidocaine  1 patch Transdermal Q24H   losartan  25 mg Oral Daily   methocarbamol  500 mg Oral TID   multivitamin with minerals  1 tablet Oral Daily   polyethylene glycol  17 g Oral Daily   rOPINIRole  0.25 mg Oral TID   senna-docusate  1 tablet Oral BID   traMADol  50 mg Oral Q12H   Continuous Infusions:     LOS: 5 days    Time spent: 30 minutes with > 50% spent at bedside and in coordination of care    Ezekiel Slocumb, DO [facility]  03/03/2021, 7:12 PM      If 7PM-7AM, please contact night-coverage. How to contact the [facility] Attending or Consulting provider [place] or covering provider during after hours [place], for this patient?    Check the care team in [facility] and look for a) attending/consulting TR[facility]  provider listed and b) the [facility] team listed Log into [email or web address] and use [place]'s universal password to access. If you do not have the password, please contact the hospital operator. Locate the  [facility] provider you are looking for under [facility] and page to a number that you can be directly reached. If you still have difficulty reaching the provider, please page the [facility] (Director on Call) for the Hospitalists listed on amion for assistance.

## 2021-03-03 NOTE — TOC Progression Note (Signed)
Transition of Care [facility]) - Progression Note    Patient Details  Name: Susan Salas MRN: [id number] Date of Birth: 07/10/1936  [facility]) CM/SW Contact  Izola Price, RN Phone Number: 03/03/2021, 12:14 PM  Clinical Narrative: 7/2: Change in orientation/Neuro consult, requiring IV pain Dilaudid per Unit Rns. Simmie Davies RN CM            Expected Discharge Plan and Services                                                 Social Determinants of Health (SDOH) Interventions    Readmission Risk Interventions No flowsheet data found.

## 2021-03-03 NOTE — Progress Notes (Signed)
Physical Therapy Treatment Patient Details Name: Susan Salas MRN: [id number] DOB: 1935/10/04 Today's Date: 03/03/2021    History of Present Illness 85 y/o female who had a fall ~3 weeks ago, now s/p L4 kyphoplasty 6/27.   Medical history significant for chronic diastolic heart failure, essential pretension, acquired hypothyroidism, osteoporosis, Parkinsonin like symptoms.    PT Comments    Pt was long sitting in bed with barely touch lunch tray in front of her. Her eyes were closed however pt was awake. She is more oriented today versus previous day but continues to keep eyes closed more often than open. With vcs, is able to open and keep open for a few minutes prior to closing them again. Pt has struggled with progress with PT over past few days. MD aware and neuro consult placed. Pt was able to exit bed with mod assist however required max assist to return after EOB activity. She stood EOB to RW 4 x with mod assist. Performed standing marching with RLE however once attempting to perform with LLE has R knee buckling with max assist to prevent falling. Pt did give good effort but unable to progress. Acute PT will continue efforts to progress pt to PLOF. Recommendation is for SNF at [place].   Follow Up Recommendations  SNF     Equipment Recommendations  None recommended by PT    Recommendations for Other Services       Precautions / Restrictions Precautions Precautions: Fall Restrictions Weight Bearing Restrictions: Yes (WBAT)    Mobility  Bed Mobility Overal bed mobility: Needs Assistance Bed Mobility: Supine to Sit;Sidelying to Sit Rolling: Min assist Sidelying to sit: Mod assist;Max assist Supine to sit: Mod assist;Max assist Sit to supine: Max assist   General bed mobility comments: Pt continues to require extensive assistance to exit/return to bed    Transfers Overall transfer level: Needs assistance Equipment used: Rolling walker (2 wheeled) Transfers: Sit to/from  Stand Sit to Stand: Mod assist         General transfer comment: Mod assist to STS 4 x from EOB. attempted ambulation however unable. R knee buckling with attempt to clear LLE. she did clear foor with max assist + RLE protected from buckling. Recommend +2 assistance for future sessions. Unwilling to sit in recliner.  Ambulation/Gait   General Gait Details: unable/unsafe to trial. was able to walk several days ago however continues to regress     Balance Overall balance assessment: Needs assistance Sitting-balance support: Bilateral upper extremity supported;Feet supported Sitting balance-Leahy Scale: Poor Sitting balance - Comments: R lateral lean   Standing balance support: Bilateral upper extremity supported;During functional activity Standing balance-Leahy Scale: Poor Standing balance comment: Constant assistance for safety to prevent LOB. has posterior lean and R lateral lean in standing      Cognition Arousal/Alertness: Lethargic;Suspect due to medications Behavior During Therapy: [facility] - Closed for tasks assessed/performed Overall Cognitive Status: History of cognitive impairments - at baseline      General Comments: Pt was more alert today however continues to keep eyes closed more often then open. She was able to follow commands consistently today. increased time for all desired task due to parkinson's/ slow processing. supportive spouse at bedside throughout         General Comments General comments (skin integrity, edema, etc.): discussed limited progress with PT and new neuro consult. per pt/pt's spouse " she does not take her parkinson's medication even at home due to the way it makes her feel."  Pertinent Vitals/Pain Pain Assessment: 0-10 Pain Score: 2  Faces Pain Scale: Hurts a little bit Pain Location: Low back/RLE Pain Descriptors / Indicators: Crying;Grimacing;Guarding;Sore Pain Intervention(s): Limited activity within patient's tolerance;Monitored during  session;Premedicated before session;Repositioned     PT Goals (current goals can now be found in the care plan section) Acute Rehab PT Goals Patient Stated Goal: walk again Progress towards PT goals: Not progressing toward goals - comment (pt not progressing. Was able to ambulate several days prior however unable to now.)    Frequency    7X/week      PT Plan Current plan remains appropriate       AM-PAC PT "6 Clicks" Mobility   Outcome Measure  Help needed turning from your back to your side while in a flat bed without using bedrails?: A Lot Help needed moving from lying on your back to sitting on the side of a flat bed without using bedrails?: A Lot Help needed moving to and from a bed to a chair (including a wheelchair)?: A Lot Help needed standing up from a chair using your arms (e.g., wheelchair or bedside chair)?: A Lot Help needed to walk in hospital room?: Total Help needed climbing 3-5 steps with a railing? : Total 6 Click Score: 10    End of Session Equipment Utilized During Treatment: Gait belt Activity Tolerance: Patient tolerated treatment well;Other (comment) (limited progress 2/2 to inability to tolerate wt on RLE to advance LLE. Parkinsons limiting) Patient left: in bed;with call bell/phone within reach;with bed alarm set;with family/visitor present Nurse Communication: Mobility status PT Visit Diagnosis: Unsteadiness on feet (R26.81);Muscle weakness (generalized) (M62.81);Difficulty in walking, not elsewhere classified (R26.2);Pain     Time: 6270-3500 PT Time Calculation (min) (ACUTE ONLY): 15 min  Charges:  $Therapeutic Activity: 8-22 mins                     Julaine Fusi PTA 03/03/21, 1:46 PM

## 2021-03-04 MED ORDER — DEXAMETHASONE 4 MG PO TABS: 4.0000 mg | ORAL_TABLET | Freq: Two times a day (BID) | ORAL | Status: DC

## 2021-03-04 MED ORDER — ENOXAPARIN SODIUM 40 MG/0.4ML IJ SOSY
0.5000 mg/kg | PREFILLED_SYRINGE | INTRAMUSCULAR | Status: DC
  Administered 2021-03-04 – 2021-03-05 (×2): 40 mg via SUBCUTANEOUS
  Filled 2021-03-04 (×2): qty 0.4

## 2021-03-04 MED ORDER — PREDNISONE 20 MG PO TABS
40.0000 mg | ORAL_TABLET | Freq: Every day | ORAL | Status: DC
  Administered 2021-03-05 – 2021-03-06 (×2): 40 mg via ORAL
  Filled 2021-03-04 (×2): qty 2

## 2021-03-04 NOTE — Progress Notes (Signed)
Physical Therapy Treatment Patient Details Name: Susan Salas MRN: [id number] DOB: 1936-01-12 Today's Date: 03/04/2021    History of Present Illness 85 y/o female who had a fall ~3 weeks ago, now s/p L4 kyphoplasty 6/27.   Medical history significant for chronic diastolic heart failure, essential pretension, acquired hypothyroidism, osteoporosis, Parkinsonin like symptoms.    PT Comments    Pt inc large loose BM upon arrival.  Care provided.  Rolling left/right with rails and supervision.  Supine AAROM limited by pain/spasm RLE to tolerance.  Does better in side lying for hip/knee flexion.  Side lying to sit with min a x 1 and rail.  She is able to sit comfortably at EOB x 25 minutes while talking with MD.  Lateral scoot to left with effort but supervision.  She did get up to commode with nursing and heavy +2 assist this am.  Reported knees buckling.  Standing deferred during PT session as +2 assist was unavailable for pt and staff safety.  Returned to supine with mod a x 2 for LE assist.   Follow Up Recommendations  SNF     Equipment Recommendations  None recommended by PT    Recommendations for Other Services       Precautions / Restrictions Precautions Precautions: Fall Restrictions Weight Bearing Restrictions: No    Mobility  Bed Mobility Overal bed mobility: Needs Assistance Bed Mobility: Rolling;Sidelying to Sit;Sit to Supine Rolling: Min assist Sidelying to sit: Min assist   Sit to supine: Mod assist        Transfers Overall transfer level: Needs assistance Equipment used: None Transfers: Lateral/Scoot Transfers          Lateral/Scoot Transfers: Min assist General transfer comment: reported continued heavy +2 assist for transfer to commode this am by tech/rn  Ambulation/Gait                 Stairs             Wheelchair Mobility    Modified Rankin (Stroke Patients Only)       Balance Overall balance assessment: Needs  assistance Sitting-balance support: Feet supported Sitting balance-Leahy Scale: Good Sitting balance - Comments: steady today with static and lateral scoots                                    Cognition Arousal/Alertness: Awake/alert Behavior During Therapy: WFL for tasks assessed/performed Overall Cognitive Status: Within Functional Limits for tasks assessed                                 General Comments: alert and appropriate conversations      Exercises Other Exercises Other Exercises: sitting EOB x 25 minutes.  supine A/AAROM limited by spasm/pain RLE.    General Comments        Pertinent Vitals/Pain Pain Assessment: Faces Faces Pain Scale: Hurts whole lot Pain Location: Low back/RLE - mostly with supine ex. Pain Descriptors / Indicators: Sore;Spasm;Guarding;Grimacing Pain Intervention(s): Limited activity within patient's tolerance;Monitored during session;Repositioned    Home Living                      Prior Function            PT Goals (current goals can now be found in the care plan section) Progress towards PT goals: Progressing toward goals  Frequency    7X/week      PT Plan Current plan remains appropriate    Co-evaluation              AM-PAC PT "6 Clicks" Mobility   Outcome Measure  Help needed turning from your back to your side while in a flat bed without using bedrails?: A Little Help needed moving from lying on your back to sitting on the side of a flat bed without using bedrails?: A Little Help needed moving to and from a bed to a chair (including a wheelchair)?: A Lot Help needed standing up from a chair using your arms (e.g., wheelchair or bedside chair)?: A Lot Help needed to walk in hospital room?: Total Help needed climbing 3-5 steps with a railing? : Total 6 Click Score: 12    End of Session Equipment Utilized During Treatment: Gait belt Activity Tolerance: Patient tolerated  treatment well Patient left: in bed;with call bell/phone within reach;with bed alarm set;with family/visitor present Nurse Communication: Mobility status PT Visit Diagnosis: Unsteadiness on feet (R26.81);Muscle weakness (generalized) (M62.81);Difficulty in walking, not elsewhere classified (R26.2);Pain Pain - Right/Left: Right Pain - part of body: Leg     Time: 7124-5809 PT Time Calculation (min) (ACUTE ONLY): 42 min  Charges:  $Therapeutic Exercise: 8-22 mins $Therapeutic Activity: 23-37 mins                    Chesley Noon, PTA 03/04/21, 1:14 PM , 1:11 PM

## 2021-03-04 NOTE — TOC Progression Note (Signed)
Transition of Care [facility]) - Progression Note    Patient Details  Name: Susan Salas MRN: [id number] Date of Birth: 17-May-1936  Transition of Care [facility]) CM/SW Contact  Izola Price, RN Phone Number: 03/04/2021, 10:50 AM  Clinical Narrative:   Per provider ready for discharge to SNF in a few days. [facility] chosen and accepted. FL2 completed 02/27/21. Patient is Medicare A/B. Last IV pain medications was around 430 pm 03/03/21. Simmie Davies RN CM          Expected Discharge Plan and Services                                                 Social Determinants of Health (SDOH) Interventions    Readmission Risk Interventions No flowsheet data found.

## 2021-03-04 NOTE — Progress Notes (Signed)
PROGRESS NOTE    Susan Salas   SHF:[id number]  DOB: December 19, 1935  PCP: Adin Hector, MD    DOA: 02/25/2021 LOS: 6   Assessment & Plan   Principal Problem:   Closed compression fracture of L4 lumbar vertebra, initial encounter [facility]) Active Problems:   Low back pain   Chronic diastolic CHF (congestive heart failure) (HCC)   Hypertension   Osteoporosis   Hypothyroidism   Parkinsonism ([place])    Subacute L4 Compression fracture secondary to Osteoporosis MRI on 6/22 w/o signs cord compression.  S/p L4 kyphoplasty on 6/27 7/1 - adjusted meds slightly to hopefully not sedate her so much. 7/2 - severe radicular & spasm type pain in R leg, not as sedated today  7/3 - much improved today, pain seems fairly controlled and pt awake and engaged/interactive again - pain control tramadol standing, oxycodone prn, hydromorphone prn.  - continue calcitonin - increase gabapentin 200>>300 mg TID (new) - stop Robaxin and resume home Baclofen seems radicular and muscle spasm pain contributing and not helped by opioids adequately. - pt/ot advising SNF, placement pending- is on prolia as outpt   Lumbar radiculopathy due to severe foraminal stenosis - MRI reviewed.  Consulted neurosurgery - given her age, would need a DEXA scan before considering an elective large fusion. If its acute pain, can recommend  a steroid taper. --start prednisone 40 mg, taper --continue gabapentin  -- home baclofen --pain control as above --cont PT, d/c to SNF once adequate symptoms control  Right groin pain - suspect radicular in nature No abnormality on physical exam. Ultrasound was negative for findings to explain, did show a normal appearing lymph node. --monitor for improvement on gabapentin   Left facial numbness - appears resolved. Reported 6/28 per prior attending: complained of sensation of numbness left side of face lateral to the nose. Normal neuro exam.   6/29 no report of this - monitor    Delirium - persistent - 2/2 medications, pain.   When pain uncontrolled, pt gets very restless, poor insight, tries to get up and is high fall risk. Improving - does okay when pain is controlled. Unfortunately, pain and anxiety control causing her to be overly sedated.   --Sitter PRN - avoid if at all possible (delays SNF d/c) ----Delirium precautions:     -Lights and TV off, minimize interruptions at night    -Blinds open and lights on during day    -Glasses/hearing aid with patient    -Frequent reorientation    -PT/OT when able    -Avoid sedation medications as possible, but control pain as best we can  Generalized weakness / ambulatory dysfunction --PT and OT following Parkinson's like contributing. SNF d/c pending for rehab.   Hypothyroidism - home Synthroid   Hypertension - home losartan   Parkinsonism - home Requip. Follows with Dr. Manuella Ghazi, neurology. Had been started on Sinemet but d/c'd on her own due to side effects and did not note improvement.  Obesity: Body mass index is 30.61 kg/m.  Complicates overall care and prognosis.  Recommend lifestyle modifications including physical activity and diet for weight loss and overall long-term health.   DVT prophylaxis: enoxaparin (LOVENOX) injection 40 mg Start: 03/04/21 2200 SCDs Start: 02/26/21 0048   Diet:  Diet Orders (From admission, onward)     Start     Ordered   02/26/21 1537  Diet regular Room service appropriate? Yes; Fluid consistency: Thin  Diet effective now       Question Answer  Comment  Room service appropriate? Yes   Fluid consistency: Thin      02/26/21 1536              Code Status: Full Code   Brief Narrative / Hospital Course to Date:   85 yo female with past hx of HTN, Parkinson's, back pain, presenting for pain control and kyphoplasty for a subacute L4 compression fracture after sustaining a mechanical fall 2-3 weeks ago.    Subjective 03/04/21    Patient sitting up at edge of bed  talking with physical therapist.  This is the first time I've seen her sitting up, not sedated or in extreme pain.  She had some R leg pain again with mobilizing but it improved more quickly.  She reports overall feeling well.     Disposition Plan & Communication   Status is: Inpatient  Remains inpatient appropriate because: requires SNF placement, pending.    Dispo: The patient is from: Home              Anticipated d/c is to: SNF              Patient currently is medically stable for d/c.   Difficult to place patient No  Family Communication: Husband at bedside on rounds   Consults, Procedures, Significant Events   Consultants:  Orthopedic surgery  Procedures:  Kyphoplasty  Antimicrobials:  Anti-infectives (From admission, onward)    Start     Dose/Rate Route Frequency Ordered Stop   02/26/21 1145  ceFAZolin (ANCEF) 2-4 GM/100ML-% IVPB       Note to Pharmacy: Maryagnes Amos   : cabinet override      02/26/21 1145 02/26/21 1238   02/26/21 0600  ceFAZolin (ANCEF) IVPB 2g/100 mL premix        2 g 200 mL/hr over 30 Minutes Intravenous On call to O.R. 02/26/21 0022 02/26/21 1221         Micro    Objective   Vitals:   03/03/21 2023 03/04/21 0500 03/04/21 0507 03/04/21 0839  BP: 121/72  (!) 112/98 139/81  Pulse: 73  (!) 55 60  Resp: 18  18 18   Temp: 98 F (36.7 C)  97.9 F (36.6 C) 97.8 F (36.6 C)  TempSrc:      SpO2: 100%  94% 90%  Weight:  80.9 kg    Height:        Intake/Output Summary (Last 24 hours) at 03/04/2021 1733 Last data filed at 03/03/2021 1843 Gross per 24 hour  Intake 240 ml  Output --  Net 240 ml   Filed Weights   02/28/21 0500 03/03/21 0500 03/04/21 0500  Weight: 86.5 kg 83.8 kg 80.9 kg    Physical Exam:  General exam: awake, sitting up edge of bed, talkative, no acute distress, good spirits Respiratory system: CTAB, normal respiratory effort, on room air, no wheezes or rhonchi Cardiovascular system: RRR Central nervous system:  A&Ox3, no gross focal neurologic deficits, normal speech, somnolent Extremities: no edema, moves all extremities, normal tone  Labs   Data Reviewed: I have personally reviewed following labs and imaging studies  CBC: Recent Labs  Lab 02/25/21 2050 02/26/21 0739 02/27/21 0507  WBC 9.2 11.3* 9.0  NEUTROABS 6.5  --   --   HGB 15.1* 14.8 13.9  HCT 44.9 44.5 42.3  MCV 93.5 92.7 93.0  PLT 287 315 740   Basic Metabolic Panel: Recent Labs  Lab 02/25/21 2050 02/26/21 0739 02/27/21 0507 03/01/21 0446 03/03/21 0526  NA 140 143 142 139 140  K 3.8 3.6 3.5 3.6 3.5  CL 101 106 104 99 100  CO2 31 29 30 31 30   GLUCOSE 120* 110* 117* 101* 100*  BUN 23 16 14 15 15   CREATININE 0.74 0.68 0.62 0.60 0.52  CALCIUM 9.9 9.9 9.6 9.7 9.8  MG  --  2.5*  --   --   --    GFR: Estimated Creatinine Clearance: 54.8 mL/min (by C-G formula based on SCr of 0.52 mg/dL). Liver Function Tests: Recent Labs  Lab 02/25/21 2050 02/26/21 0739  AST 21 28  ALT 27 32  ALKPHOS 113 113  BILITOT 1.1 1.4*  PROT 7.4 7.3  ALBUMIN 4.4 4.3   Recent Labs  Lab 02/25/21 2050  LIPASE 25   No results for input(s): AMMONIA in the last 168 hours. Coagulation Profile: Recent Labs  Lab 02/26/21 0739  INR 1.0   Cardiac Enzymes: No results for input(s): CKTOTAL, CKMB, CKMBINDEX, TROPONINI in the last 168 hours. BNP (last 3 results) No results for input(s): PROBNP in the last 8760 hours. HbA1C: No results for input(s): HGBA1C in the last 72 hours. CBG: No results for input(s): GLUCAP in the last 168 hours. Lipid Profile: No results for input(s): CHOL, HDL, LDLCALC, TRIG, CHOLHDL, LDLDIRECT in the last 72 hours. Thyroid Function Tests: No results for input(s): TSH, T4TOTAL, FREET4, T3FREE, THYROIDAB in the last 72 hours. Anemia Panel: No results for input(s): VITAMINB12, FOLATE, FERRITIN, TIBC, IRON, RETICCTPCT in the last 72 hours. Sepsis Labs: No results for input(s): PROCALCITON, LATICACIDVEN in the last  168 hours.  Recent Results (from the past 240 hour(s))  Resp Panel by RT-PCR (Flu A&B, Covid)     Status: None   Collection Time: 02/26/21  2:22 AM   Specimen: Nasopharyngeal(NP) swabs in vial transport medium  Result Value Ref Range Status   SARS Coronavirus 2 by RT PCR NEGATIVE NEGATIVE Final    Comment: (NOTE) SARS-CoV-2 target nucleic acids are NOT DETECTED.  The SARS-CoV-2 RNA is generally detectable in upper respiratory specimens during the acute phase of infection. The lowest concentration of SARS-CoV-2 viral copies this assay can detect is 138 copies/mL. A negative result does not preclude SARS-Cov-2 infection and should not be used as the sole basis for treatment or other patient management decisions. A negative result may occur with  improper specimen collection/handling, submission of specimen other than nasopharyngeal swab, presence of viral mutation(s) within the areas targeted by this assay, and inadequate number of viral copies(<138 copies/mL). A negative result must be combined with clinical observations, patient history, and epidemiological information. The expected result is Negative.  Fact Sheet for Patients:  EntrepreneurPulse.com.au  Fact Sheet for Healthcare Providers:  IncredibleEmployment.be  This test is no t yet approved or cleared by the [place] FDA and  has been authorized for detection and/or diagnosis of SARS-CoV-2 by FDA under an Emergency Use Authorization (EUA). This EUA will remain  in effect (meaning this test can be used) for the duration of the COVID-19 declaration under Section 564(b)(1) of the Act, 21 U.S.C.section 360bbb-3(b)(1), unless the authorization is terminated  or revoked sooner.       Influenza A by PCR NEGATIVE NEGATIVE Final   Influenza B by PCR NEGATIVE NEGATIVE Final    Comment: (NOTE) The Xpert Xpress SARS-CoV-2/FLU/RSV plus assay is intended as an aid in the diagnosis of  influenza from Nasopharyngeal swab specimens and should not be used as a sole basis for treatment. Nasal washings and  aspirates are unacceptable for Xpert Xpress SARS-CoV-2/FLU/RSV testing.  Fact Sheet for Patients: EntrepreneurPulse.com.au  Fact Sheet for Healthcare Providers: IncredibleEmployment.be  This test is not yet approved or cleared by the [place] FDA and has been authorized for detection and/or diagnosis of SARS-CoV-2 by FDA under an Emergency Use Authorization (EUA). This EUA will remain in effect (meaning this test can be used) for the duration of the COVID-19 declaration under Section 564(b)(1) of the Act, 21 U.S.C. section 360bbb-3(b)(1), unless the authorization is terminated or revoked.  Performed at [facility], [place], [place] [address]   SARS CORONAVIRUS 2 (TAT 6-24 HRS) Nasopharyngeal Nasopharyngeal Swab     Status: None   Collection Time: 02/28/21  6:31 PM   Specimen: Nasopharyngeal Swab  Result Value Ref Range Status   SARS Coronavirus 2 NEGATIVE NEGATIVE Final    Comment: (NOTE) SARS-CoV-2 target nucleic acids are NOT DETECTED.  The SARS-CoV-2 RNA is generally detectable in upper and lower respiratory specimens during the acute phase of infection. Negative results do not preclude SARS-CoV-2 infection, do not rule out co-infections with other pathogens, and should not be used as the sole basis for treatment or other patient management decisions. Negative results must be combined with clinical observations, patient history, and epidemiological information. The expected result is Negative.  Fact Sheet for Patients: SugarRoll.be  Fact Sheet for Healthcare Providers: https://www.woods-mathews.com/  This test is not yet approved or cleared by the [place] FDA and  has been authorized for detection and/or diagnosis of SARS-CoV-2 by FDA under  an Emergency Use Authorization (EUA). This EUA will remain  in effect (meaning this test can be used) for the duration of the COVID-19 declaration under Se ction 564(b)(1) of the Act, 21 U.S.C. section 360bbb-3(b)(1), unless the authorization is terminated or revoked sooner.  Performed at [facility], [place] [address], [place], [place] [address]       Imaging Studies   No results found.   Medications   Scheduled Meds:  acetaminophen  1,000 mg Oral Q8H   aspirin EC  81 mg Oral Daily   baclofen  5 mg Oral BID   calcitonin (salmon)  1 spray Alternating Nares Daily   calcium-vitamin D  1 tablet Oral BID WC   carvedilol  10 mg Oral QHS   DULoxetine  60 mg Oral Daily   enoxaparin (LOVENOX) injection  0.5 mg/kg Subcutaneous Q24H   feeding supplement  237 mL Oral BID BM   gabapentin  300 mg Oral TID   levothyroxine  88 mcg Oral QAC breakfast   lidocaine  1 patch Transdermal Q24H   losartan  25 mg Oral Daily   multivitamin with minerals  1 tablet Oral Daily   polyethylene glycol  17 g Oral Daily   [START ON 03/05/2021] predniSONE  40 mg Oral Q breakfast   rOPINIRole  0.25 mg Oral TID   senna-docusate  1 tablet Oral BID   traMADol  50 mg Oral Q12H   Vitamin D (Ergocalciferol)  50,000 Units Oral Weekly   vitamin E  100 Units Oral Daily   Continuous Infusions:     LOS: 6 days    Time spent: 25 minutes with > 50% spent at bedside and in coordination of care    Ezekiel Slocumb, DO [facility]  03/04/2021, 5:33 PM      If 7PM-7AM, please contact night-coverage. How to contact the [facility] Attending or Consulting provider [place] or covering provider  during after hours 7P -7A, for this patient?    Check the care team in [facility] and look for a) attending/consulting TRH provider listed and b) the [facility] team listed Log into [email or web address] and use [place] universal password to access. If you do not have the password, please contact the hospital operator. Locate the  [facility] provider you are looking for under [facility] and page to a number that you can be directly reached. If you still have difficulty reaching the provider, please page the [facility] (Director on Call) for the Hospitalists listed on amion for assistance.

## 2021-03-05 LAB — BASIC METABOLIC PANEL
Anion gap: 7 (ref 5–15)
BUN: 17 mg/dL (ref 8–23)
CO2: 30 mmol/L (ref 22–32)
Calcium: 9.8 mg/dL (ref 8.9–10.3)
Chloride: 102 mmol/L (ref 98–111)
Creatinine, Ser: 0.59 mg/dL (ref 0.44–1.00)
GFR, Estimated: >60 mL/min (ref >60)
Glucose, Bld: 102 mg/dL — ABNORMAL HIGH (ref 70–99)
Potassium: 3.6 mmol/L (ref 3.5–5.1)
Sodium: 139 mmol/L (ref 135–145)

## 2021-03-05 LAB — SARS CORONAVIRUS 2 (TAT 6-24 HRS): SARS Coronavirus 2: NEGATIVE

## 2021-03-05 MED ORDER — GABAPENTIN 300 MG PO CAPS
600.0000 mg | ORAL_CAPSULE | Freq: Three times a day (TID) | ORAL | Status: DC
  Administered 2021-03-05 – 2021-03-06 (×3): 600 mg via ORAL
  Filled 2021-03-05 (×3): qty 2

## 2021-03-05 MED ORDER — DICLOFENAC SODIUM 1 % EX GEL
4.0000 g | Freq: Four times a day (QID) | CUTANEOUS | Status: DC
  Administered 2021-03-05 – 2021-03-06 (×5): 4 g via TOPICAL
  Filled 2021-03-05: qty 100

## 2021-03-05 NOTE — Progress Notes (Signed)
Physical Therapy Treatment Patient Details Name: Susan Salas MRN: [id number] DOB: 1936/01/05 Today's Date: 03/05/2021    History of Present Illness 85 y/o female who had a fall ~3 weeks ago, now s/p L4 kyphoplasty 6/27.   Medical history significant for chronic diastolic heart failure, essential pretension, acquired hypothyroidism, osteoporosis, Parkinsonin like symptoms.    PT Comments    Patient in bed asleep upon PT arrival. Husband present reports patient just received pain medication. Patient awakened by PT and agreeable to do supine exercises, refuses to sit EOB. Upon performing supine intervention patient continuously fell back asleep. Session eventually terminated due to limited ability to remain awake and intact. Current POC remains appropriate.     Follow Up Recommendations  SNF     Equipment Recommendations  None recommended by PT    Recommendations for Other Services       Precautions / Restrictions Precautions Precautions: Fall Restrictions Weight Bearing Restrictions: No    Mobility  Bed Mobility               General bed mobility comments: unable to perform due to falling asleep.    Transfers                 General transfer comment: unable to perform due to falling asleep  Ambulation/Gait                 Stairs             Wheelchair Mobility    Modified Rankin (Stroke Patients Only)       Balance       Sitting balance - Comments: unable to perform today due to patient cognition/falling asleep                                    Cognition Arousal/Alertness: Lethargic;Suspect due to medications Behavior During Therapy:  (kept falling asleep) Overall Cognitive Status: Difficult to assess                                 General Comments: Patient kept falling asleep mid task      Exercises Other Exercises Other Exercises: supine AAROM: df/pf 12x each LE, heel side 10x each LE,  abduction 10x each LE, further mobility deferred due to patient cognition/falling asleep.    General Comments        Pertinent Vitals/Pain Pain Assessment: No/denies pain (Patient just received pain medication.)    Home Living                      Prior Function            PT Goals (current goals can now be found in the care plan section) Acute Rehab PT Goals Patient Stated Goal: walk again Time For Goal Achievement: 03/13/21 Potential to Achieve Goals: Fair Progress towards PT goals: Not progressing toward goals - comment (patient unable to interact during session, kept falling asleep)    Frequency    7X/week      PT Plan Current plan remains appropriate    Co-evaluation              AM-PAC PT "6 Clicks" Mobility   Outcome Measure  Help needed turning from your back to your side while in a flat bed without using bedrails?: A Little Help needed moving  from lying on your back to sitting on the side of a flat bed without using bedrails?: A Little Help needed moving to and from a bed to a chair (including a wheelchair)?: A Lot Help needed standing up from a chair using your arms (e.g., wheelchair or bedside chair)?: A Lot Help needed to walk in hospital room?: Total Help needed climbing 3-5 steps with a railing? : Total 6 Click Score: 12    End of Session   Activity Tolerance: Patient limited by fatigue;Patient limited by lethargy Patient left: in bed;with call bell/phone within reach;with bed alarm set;with family/visitor present Nurse Communication: Mobility status PT Visit Diagnosis: Unsteadiness on feet (R26.81);Muscle weakness (generalized) (M62.81);Difficulty in walking, not elsewhere classified (R26.2);Pain Pain - Right/Left: Right Pain - part of body: Leg     Time: 4098-1191 PT Time Calculation (min) (ACUTE ONLY): 11 min  Charges:  $Therapeutic Exercise: 8-22 mins          Janna Arch, PT, DPT  03/05/2021, 3:22 PM

## 2021-03-05 NOTE — Care Management Important Message (Signed)
Important Message  Patient Details  Name: Susan Salas MRN: [id number] Date of Birth: [birth year]   Medicare Important Message Given:  Yes     Dannette Barbara 03/05/2021, 11:26 AM

## 2021-03-05 NOTE — TOC Progression Note (Signed)
Transition of Care [facility]) - Progression Note    Patient Details  Name: Susan Salas MRN: [id number] Date of Birth: Feb 06, 1936  [facility]) CM/SW Contact  Eileen Stanford, LCSW Phone Number: 03/05/2021, 12:20 PM  Clinical Narrative:  Per Tammy with [facility] the pharmacy has closed for today given the holiday. Tammy asking that we dc pt first thing in the AM. MD notified.          Expected Discharge Plan and Services                                                 Social Determinants of Health (SDOH) Interventions    Readmission Risk Interventions No flowsheet data found.

## 2021-03-05 NOTE — Progress Notes (Signed)
PROGRESS NOTE    Susan Salas   JKD:[id number]  DOB: 05-29-1936  PCP: Adin Hector, MD    DOA: 02/25/2021 LOS: 7   Assessment & Plan   Principal Problem:   Closed compression fracture of L4 lumbar vertebra, initial encounter [facility]) Active Problems:   Low back pain   Chronic diastolic CHF (congestive heart failure) ([place])   Hypertension   Osteoporosis   Hypothyroidism   Parkinsonism ([place])    Subacute L4 Compression fracture secondary to Osteoporosis MRI on 6/22 w/o signs cord compression.  S/p L4 kyphoplasty on 6/27 7/1 - adjusted meds slightly to hopefully not sedate her so much. 7/2 - severe radicular & spasm type pain in R leg, not as sedated today  7/3 - much improved today, pain seems fairly controlled and pt awake and engaged/interactive again 7/4 - pain R thigh all night and this AM - radiculopathy.  Tolerating gabapentin well without side effects - pain control tramadol standing, oxycodone prn, hydromorphone prn.  - continue calcitonin - increase gabapentin 200>>300>>600 mg TID (new) - stop Robaxin and resume home Baclofen seems radicular and muscle spasm pain contributing and not helped by opioids adequately. - pt/ot advising SNF, placement pending- is on prolia as outpt   Lumbar radiculopathy due to severe foraminal stenosis - MRI reviewed.  Consulted neurosurgery - given her age, would need a DEXA scan before considering an elective large fusion. If its acute pain, can recommend  a steroid taper. --start prednisone 40 mg, taper --continue gabapentin  -- home baclofen --pain control as above --cont PT, d/c to SNF once adequate symptoms control  Right groin pain - suspect radicular in nature No abnormality on physical exam. Ultrasound was negative for findings to explain, did show a normal appearing lymph node. --monitor for improvement on gabapentin   Left facial numbness - appears resolved. Reported 6/28 per prior attending: complained of  sensation of numbness left side of face lateral to the nose. Normal neuro exam.   6/29 no report of this - monitor   Delirium - persistent - 2/2 medications, pain.   When pain uncontrolled, pt gets very restless, poor insight, tries to get up and is high fall risk. Improving - does okay when pain is controlled. Unfortunately, pain and anxiety control causing her to be overly sedated.   --Sitter PRN - avoid if at all possible (delays SNF d/c) ----Delirium precautions:     -Lights and TV off, minimize interruptions at night    -Blinds open and lights on during day    -Glasses/hearing aid with patient    -Frequent reorientation    -PT/OT when able    -Avoid sedation medications as possible, but control pain as best we can  Generalized weakness / ambulatory dysfunction --PT and OT following Parkinson's like contributing. SNF d/c pending for rehab.   Hypothyroidism - home Synthroid   Hypertension - home losartan   Parkinsonism - home Requip. Follows with Dr. Manuella Ghazi, neurology. Had been started on Sinemet but d/c'd on her own due to side effects and did not note improvement.  Obesity: Body mass index is 30.46 kg/m.  Complicates overall care and prognosis.  Recommend lifestyle modifications including physical activity and diet for weight loss and overall long-term health.   DVT prophylaxis: enoxaparin (LOVENOX) injection 40 mg Start: 03/04/21 2200 SCDs Start: 02/26/21 0048   Diet:  Diet Orders (From admission, onward)     Start     Ordered   02/26/21 1537  Diet regular  Room service appropriate? Yes; Fluid consistency: Thin  Diet effective now       Question Answer Comment  Room service appropriate? Yes   Fluid consistency: Thin      02/26/21 1536              Code Status: Full Code   Brief Narrative / Hospital Course to Date:   85 yo female with past hx of HTN, Parkinson's, back pain, presenting for pain control and kyphoplasty for a subacute L4 compression fracture  after sustaining a mechanical fall 2-3 weeks ago.    Subjective 03/05/21    Patient having more right thigh pain again today.  Started overnight and she reports no sleep due to the pain.  Otherwise she reports feeling okay and denies other acute complaints.   Husband at bedside, concerned about pain remaining uncontrolled at times.  We discussed the nature of radicular pain and that we may never get it under full control at all times, some level of pain is to be expected.  Hopeful increasing gabapentin will help.  He and pt agree gabapentin seemed to be what's given most relief.  Pt just given pain med not long ago and getting quite drowsy.   Disposition Plan & Communication   Status is: Inpatient  Remains inpatient appropriate because: requires SNF placement, pending.  If pain controlled, expect d/c tomorrow.  Dispo: The patient is from: Home              Anticipated d/c is to: SNF              Patient currently is medically stable for d/c.   Difficult to place patient No  Family Communication: Husband at bedside on rounds   Consults, Procedures, Significant Events   Consultants:  Orthopedic surgery  Procedures:  Kyphoplasty  Antimicrobials:  Anti-infectives (From admission, onward)    Start     Dose/Rate Route Frequency Ordered Stop   02/26/21 1145  ceFAZolin (ANCEF) 2-4 GM/100ML-% IVPB       Note to Pharmacy: Maryagnes Amos   : cabinet override      02/26/21 1145 02/26/21 1238   02/26/21 0600  ceFAZolin (ANCEF) IVPB 2g/100 mL premix        2 g 200 mL/hr over 30 Minutes Intravenous On call to O.R. 02/26/21 0022 02/26/21 1221         Micro    Objective   Vitals:   03/04/21 1941 03/05/21 0500 03/05/21 0736 03/05/21 1159  BP: (!) 141/70 (!) 187/90 (!) 164/90 (!) 169/90  Pulse: 74 69 75 82  Resp: 17 17 19 16   Temp: 98 F (36.7 C) 98 F (36.7 C) 98.1 F (36.7 C) 98.2 F (36.8 C)  TempSrc: Oral Oral Oral Oral  SpO2: 98% 92% 97% 96%  Weight:  80.5 kg     Height:        Intake/Output Summary (Last 24 hours) at 03/05/2021 1343 Last data filed at 03/04/2021 1843 Gross per 24 hour  Intake 0 ml  Output --  Net 0 ml   Filed Weights   03/03/21 0500 03/04/21 0500 03/05/21 0500  Weight: 83.8 kg 80.9 kg 80.5 kg    Physical Exam:  General exam: awake laying in bed, eyes closed, winces at times. no acute distress Respiratory system: normal respiratory effort, on room air Cardiovascular system: RRR, no peripheral edema GI: soft non-tender +bs's Central nervous system: A&Ox3, no gross focal neurologic deficits, normal speech, somnolent Extremities: R thigh nontender on  palpation, no edema, moves all extremities, normal tone  Labs   Data Reviewed: I have personally reviewed following labs and imaging studies  CBC: Recent Labs  Lab 02/27/21 0507  WBC 9.0  HGB 13.9  HCT 42.3  MCV 93.0  PLT 956   Basic Metabolic Panel: Recent Labs  Lab 02/27/21 0507 03/01/21 0446 03/03/21 0526 03/05/21 0528  NA 142 139 140 139  K 3.5 3.6 3.5 3.6  CL 104 99 100 102  CO2 30 31 30 30   GLUCOSE 117* 101* 100* 102*  BUN 14 15 15 17   CREATININE 0.62 0.60 0.52 0.59  CALCIUM 9.6 9.7 9.8 9.8   GFR: Estimated Creatinine Clearance: 54.7 mL/min (by C-G formula based on SCr of 0.59 mg/dL). Liver Function Tests: No results for input(s): AST, ALT, ALKPHOS, BILITOT, PROT, ALBUMIN in the last 168 hours.  No results for input(s): LIPASE, AMYLASE in the last 168 hours.  No results for input(s): AMMONIA in the last 168 hours. Coagulation Profile: No results for input(s): INR, PROTIME in the last 168 hours.  Cardiac Enzymes: No results for input(s): CKTOTAL, CKMB, CKMBINDEX, TROPONINI in the last 168 hours. BNP (last 3 results) No results for input(s): PROBNP in the last 8760 hours. HbA1C: No results for input(s): HGBA1C in the last 72 hours. CBG: No results for input(s): GLUCAP in the last 168 hours. Lipid Profile: No results for input(s): CHOL, HDL,  LDLCALC, TRIG, CHOLHDL, LDLDIRECT in the last 72 hours. Thyroid Function Tests: No results for input(s): TSH, T4TOTAL, FREET4, T3FREE, THYROIDAB in the last 72 hours. Anemia Panel: No results for input(s): VITAMINB12, FOLATE, FERRITIN, TIBC, IRON, RETICCTPCT in the last 72 hours. Sepsis Labs: No results for input(s): PROCALCITON, LATICACIDVEN in the last 168 hours.  Recent Results (from the past 240 hour(s))  Resp Panel by RT-PCR (Flu A&B, Covid)     Status: None   Collection Time: 02/26/21  2:22 AM   Specimen: Nasopharyngeal(NP) swabs in vial transport medium  Result Value Ref Range Status   SARS Coronavirus 2 by RT PCR NEGATIVE NEGATIVE Final    Comment: (NOTE) SARS-CoV-2 target nucleic acids are NOT DETECTED.  The SARS-CoV-2 RNA is generally detectable in upper respiratory specimens during the acute phase of infection. The lowest concentration of SARS-CoV-2 viral copies this assay can detect is 138 copies/mL. A negative result does not preclude SARS-Cov-2 infection and should not be used as the sole basis for treatment or other patient management decisions. A negative result may occur with  improper specimen collection/handling, submission of specimen other than nasopharyngeal swab, presence of viral mutation(s) within the areas targeted by this assay, and inadequate number of viral copies(<138 copies/mL). A negative result must be combined with clinical observations, patient history, and epidemiological information. The expected result is Negative.  Fact Sheet for Patients:  EntrepreneurPulse.com.au  Fact Sheet for Healthcare Providers:  IncredibleEmployment.be  This test is no t yet approved or cleared by the [place] FDA and  has been authorized for detection and/or diagnosis of SARS-CoV-2 by FDA under an Emergency Use Authorization (EUA). This EUA will remain  in effect (meaning this test can be used) for the duration of  the COVID-19 declaration under Section 564(b)(1) of the Act, 21 U.S.C.section 360bbb-3(b)(1), unless the authorization is terminated  or revoked sooner.       Influenza A by PCR NEGATIVE NEGATIVE Final   Influenza B by PCR NEGATIVE NEGATIVE Final    Comment: (NOTE) The Xpert Xpress SARS-CoV-2/FLU/RSV plus assay is intended as  an aid in the diagnosis of influenza from Nasopharyngeal swab specimens and should not be used as a sole basis for treatment. Nasal washings and aspirates are unacceptable for Xpert Xpress SARS-CoV-2/FLU/RSV testing.  Fact Sheet for Patients: EntrepreneurPulse.com.au  Fact Sheet for Healthcare Providers: IncredibleEmployment.be  This test is not yet approved or cleared by the [place] FDA and has been authorized for detection and/or diagnosis of SARS-CoV-2 by FDA under an Emergency Use Authorization (EUA). This EUA will remain in effect (meaning this test can be used) for the duration of the COVID-19 declaration under Section 564(b)(1) of the Act, 21 U.S.C. section 360bbb-3(b)(1), unless the authorization is terminated or revoked.  Performed at [facility], [place], [place] [address]   SARS CORONAVIRUS 2 (TAT 6-24 HRS) Nasopharyngeal Nasopharyngeal Swab     Status: None   Collection Time: 02/28/21  6:31 PM   Specimen: Nasopharyngeal Swab  Result Value Ref Range Status   SARS Coronavirus 2 NEGATIVE NEGATIVE Final    Comment: (NOTE) SARS-CoV-2 target nucleic acids are NOT DETECTED.  The SARS-CoV-2 RNA is generally detectable in upper and lower respiratory specimens during the acute phase of infection. Negative results do not preclude SARS-CoV-2 infection, do not rule out co-infections with other pathogens, and should not be used as the sole basis for treatment or other patient management decisions. Negative results must be combined with clinical observations, patient history, and  epidemiological information. The expected result is Negative.  Fact Sheet for Patients: SugarRoll.be  Fact Sheet for Healthcare Providers: https://www.woods-mathews.com/  This test is not yet approved or cleared by the [place] FDA and  has been authorized for detection and/or diagnosis of SARS-CoV-2 by FDA under an Emergency Use Authorization (EUA). This EUA will remain  in effect (meaning this test can be used) for the duration of the COVID-19 declaration under Se ction 564(b)(1) of the Act, 21 U.S.C. section 360bbb-3(b)(1), unless the authorization is terminated or revoked sooner.  Performed at [facility], [place] [address] [place], [place] [address]       Imaging Studies   No results found.   Medications   Scheduled Meds:  acetaminophen  1,000 mg Oral Q8H   aspirin EC  81 mg Oral Daily   baclofen  5 mg Oral BID   calcitonin (salmon)  1 spray Alternating Nares Daily   calcium-vitamin D  1 tablet Oral BID WC   carvedilol  10 mg Oral QHS   diclofenac Sodium  4 g Topical QID   DULoxetine  60 mg Oral Daily   enoxaparin (LOVENOX) injection  0.5 mg/kg Subcutaneous Q24H   feeding supplement  237 mL Oral BID BM   gabapentin  600 mg Oral TID   levothyroxine  88 mcg Oral QAC breakfast   lidocaine  1 patch Transdermal Q24H   losartan  25 mg Oral Daily   multivitamin with minerals  1 tablet Oral Daily   polyethylene glycol  17 g Oral Daily   predniSONE  40 mg Oral Q breakfast   rOPINIRole  0.25 mg Oral TID   senna-docusate  1 tablet Oral BID   traMADol  50 mg Oral Q12H   Vitamin D (Ergocalciferol)  50,000 Units Oral Weekly   vitamin E  100 Units Oral Daily   Continuous Infusions:     LOS: 7 days    Time spent: 25 minutes with > 50% spent at bedside and in coordination of care    Ezekiel Slocumb, DO  [facility]  03/05/2021, 1:43 PM      If 7PM-7AM, please contact night-coverage. How to contact the  [facility] Attending or Consulting provider [place] or covering provider during after hours [place], for this patient?    Check the care team in [facility] and look for a) attending/consulting [facility] provider listed and b) the [facility] team listed Log into [email or web address] and use [place]'s universal password to access. If you do not have the password, please contact the hospital operator. Locate the [facility] provider you are looking for under [facility] and page to a number that you can be directly reached. If you still have difficulty reaching the provider, please page the [facility] (Director on Call) for the Hospitalists listed on amion for assistance.

## 2021-03-06 MED ORDER — LORAZEPAM 0.5 MG PO TABS: 0.5000 mg | ORAL_TABLET | Freq: Three times a day (TID) | ORAL | 0 refills | Status: DC | PRN

## 2021-03-06 MED ORDER — TRAMADOL HCL 50 MG PO TABS: 50.0000 mg | ORAL_TABLET | Freq: Two times a day (BID) | ORAL | 0 refills | Status: DC

## 2021-03-06 MED ORDER — SENNOSIDES-DOCUSATE SODIUM 8.6-50 MG PO TABS: 1.0000 | ORAL_TABLET | Freq: Two times a day (BID) | ORAL | Status: DC

## 2021-03-06 MED ORDER — LIDOCAINE 5 % EX PTCH: 1.0000 | MEDICATED_PATCH | CUTANEOUS | 0 refills | Status: DC

## 2021-03-06 MED ORDER — BISACODYL 5 MG PO TBEC
5.0000 mg | DELAYED_RELEASE_TABLET | Freq: Every day | ORAL | 0 refills | Status: DC | PRN
Start: 2021-03-06 — End: 2022-05-14

## 2021-03-06 MED ORDER — LEVOTHYROXINE SODIUM 88 MCG PO TABS: 88.0000 ug | ORAL_TABLET | Freq: Every day | ORAL | Status: AC

## 2021-03-06 MED ORDER — GABAPENTIN 300 MG PO CAPS: 600.0000 mg | ORAL_CAPSULE | Freq: Three times a day (TID) | ORAL | Status: AC

## 2021-03-06 MED ORDER — OXYCODONE HCL 5 MG PO TABS: 5.0000 mg | ORAL_TABLET | Freq: Four times a day (QID) | ORAL | 0 refills | Status: AC | PRN

## 2021-03-06 MED ORDER — ACETAMINOPHEN 500 MG PO TABS: 1000.0000 mg | ORAL_TABLET | Freq: Three times a day (TID) | ORAL | 0 refills | Status: DC

## 2021-03-06 MED ORDER — NALOXONE HCL 2 MG/2ML IJ SOSY
0.4000 mg | PREFILLED_SYRINGE | INTRAMUSCULAR | Status: DC | PRN
Start: 2021-03-06 — End: 2022-05-14

## 2021-03-06 MED ORDER — POLYETHYLENE GLYCOL 3350 17 G PO PACK: 17.0000 g | PACK | Freq: Every day | ORAL | 0 refills | Status: DC

## 2021-03-06 MED ORDER — CALCITONIN (SALMON) 200 UNIT/ACT NA SOLN: 1.0000 | Freq: Every day | NASAL | 12 refills | Status: DC

## 2021-03-06 MED ORDER — PREDNISONE 20 MG PO TABS: ORAL_TABLET | ORAL | 0 refills | Status: AC

## 2021-03-06 MED ORDER — ENSURE ENLIVE PO LIQD: 237.0000 mL | Freq: Two times a day (BID) | ORAL | 12 refills | Status: DC

## 2021-03-06 NOTE — Discharge Summary (Signed)
Physician Discharge Summary  Susan Salas QVZ:[id number] DOB: [birth year] DOA: 02/25/2021  PCP: Adin Hector, MD  Admit date: 02/25/2021 Discharge date: 03/06/2021  Admitted From: Home Disposition:  SNF  Recommendations for Outpatient Follow-up:  Follow up with PCP in 1-2 weeks Please obtain BMP/CBC in one week Please follow up with neurology as scheduled Recommend consideration for lumbar ESI or other minimally invasive measure for severe lumbar radiculopathy  Home Health: No  Equipment/Devices: None   Discharge Condition: Stable  CODE STATUS: Full   Diet recommendation:  Regular    Discharge Diagnoses: Principal Problem:   Closed compression fracture of L4 lumbar vertebra, initial encounter ([place]) Active Problems:   Low back pain   Chronic diastolic CHF (congestive heart failure) ([place])   Hypertension   Osteoporosis   Hypothyroidism   Parkinsonism ([place])    Summary of HPI and Hospital Course:  85 yo female with past hx of HTN, Parkinson's, back pain, presenting for pain control and kyphoplasty for a subacute L4 compression fracture after sustaining a mechanical fall 2-3 weeks ago.    Subacute L4 Compression fracture secondary to Osteoporosis MRI on 6/22 w/o signs cord compression.  S/p L4 kyphoplasty on 6/27 - pain control with scheduled Tylenol and Tramadol, PRN oxycodone, lidocaine patches. - continue calcitonin - continue home Baclofen  Patient's pain source seems primary due to radiculopathy - has been better controlled with adding gabapentin.     Lumbar radiculopathy due to severe foraminal stenosis - MRI reviewed.  Consulted neurosurgery - given her age, would need a DEXA scan before considering an elective large fusion. If its acute pain, steroid taper. --start prednisone 40 mg, taper prescribed on d/c --started on low dose gabapentin and slowly have increased, tolerating but MONITOR for side effects --continue gabapentin at 600 mg TID  -- home  baclofen --pain control as above --d/c to SNF for ongoing rehab   Right groin pain due to radiculopathy -  No abnormality on physical exam. Ultrasound was negative for findings to explain, did show a normal appearing lymph node. --monitor for improvement on gabapentin   Left facial numbness - appears resolved. Reported 6/28 per prior attending: complained of sensation of numbness left side of face lateral to the nose. Normal neuro exam.   6/29 no further reports of this.   Delirium - persistent - Improved.  Intermittently disoriented but easily redirected. Due to medications and  pain at times. When pain uncontrolled, pt gets very restless. Does very well when pain is controlled.  ----Delirium precautions:    -Lights and TV off, minimize interruptions at night    -Blinds open and lights on during day    -Glasses/hearing aid with patient    -Frequent reorientation    -PT/OT regularly    -Avoid sedation medications as possible, but control pain as best we can   Generalized weakness / ambulatory dysfunction --PT and OT following Parkinson's like contributing. SNF discharge for ongoing rehab.   Hypothyroidism - home Synthroid   Hypertension - home losartan   Parkinsonism - home Requip. Follows with Dr. Manuella Ghazi, neurology. Had been started on Sinemet but d/c'd on her own due to side effects and did not note any improvement.   Obesity: Body mass index is 30.46 kg/m.  Complicates overall care and prognosis.  Recommend lifestyle modifications including physical activity and diet for weight loss and overall long-term health.     Discharge Instructions   Discharge Instructions     Call MD for:  extreme fatigue  Complete by: As directed    Call MD for:  persistant dizziness or light-headedness   Complete by: As directed    Call MD for:  persistant nausea and vomiting   Complete by: As directed    Call MD for:  severe uncontrolled pain   Complete by: As directed    Call MD  for:  temperature >100.4   Complete by: As directed    Diet - low sodium heart healthy   Complete by: As directed    Discharge instructions   Complete by: As directed    Your Right Leg & Thigh pain is due to pinched nerves in your lower back.  You may find that certain positions are more comfortable - laying on your right side, and when sitting up it may help to slightly lean forward - these positions take pressure off the nerves coming out the Right side of your back.  Continue taking scheduled Tyelonol and Tramadol, Gabapentin (monitor for side effects), Voltaren gel and AS NEEDED oxycodone if pain uncontrolled despite the scheduled medications.   Increase activity slowly   Complete by: As directed    No wound care   Complete by: As directed       Allergies as of 03/06/2021       Reactions   Other Hives, Rash   "filler in all generic drugs" Dissolvable stitches-do not dissolve   Statins Other (See Comments)   Muscle weakness Other reaction(s): Other (See Comments) Other reaction(s): Other (See Comments) Muscle weakness Muscle weakness Muscle weakness   Sulfa Antibiotics Shortness Of Breath   Sulfasalazine Shortness Of Breath   Ibandronic Acid Other (See Comments)   (BONIVA) Muscle Pain   Sertraline    Other reaction(s): UNKNOWN   Hydrochlorothiazide Rash   Lidocaine Rash   Morphine Rash   Ramipril Rash   Tape Rash   Pt can tolerate paper tape.        Medication List     STOP taking these medications    acyclovir ointment 5 % Commonly known as: ZOVIRAX   HYDROcodone-acetaminophen 5-325 MG tablet Commonly known as: NORCO/VICODIN   oxyCODONE-acetaminophen 5-325 MG tablet Commonly known as: PERCOCET/ROXICET       TAKE these medications    acetaminophen 500 MG tablet Commonly known as: TYLENOL Take 2 tablets (1,000 mg total) by mouth every 8 (eight) hours. What changed:  how much to take when to take this reasons to take this   aspirin 81 MG EC  tablet Take 81 mg by mouth daily.   baclofen 10 MG tablet Commonly known as: LIORESAL Take 5 mg by mouth 2 (two) times daily.   bisacodyl 5 MG EC tablet Commonly known as: DULCOLAX Take 1 tablet (5 mg total) by mouth daily as needed for moderate constipation.   calcitonin (salmon) 200 UNIT/ACT nasal spray Commonly known as: MIACALCIN/FORTICAL Place 1 spray into alternate nostrils daily.   Calcium Carb-Cholecalciferol 600-400 MG-UNIT Tabs Take 1 tablet by mouth 2 (two) times daily with a meal.   Coreg CR 10 MG 24 hr capsule Generic drug: carvedilol Take 10 mg by mouth at bedtime.   diclofenac Sodium 1 % Gel Commonly known as: VOLTAREN Apply 2 g topically daily as needed.   DULoxetine 30 MG capsule Commonly known as: CYMBALTA Take 60 mg by mouth daily.   feeding supplement Liqd Take 237 mLs by mouth 2 (two) times daily between meals.   furosemide 40 MG tablet Commonly known as: LASIX Take 40 mg by mouth  daily as needed for edema.   gabapentin 300 MG capsule Commonly known as: NEURONTIN Take 2 capsules (600 mg total) by mouth 3 (three) times daily.   ibuprofen 200 MG tablet Commonly known as: ADVIL Take 200 mg by mouth every 6 (six) hours as needed.   levothyroxine 88 MCG tablet Commonly known as: SYNTHROID Take 1 tablet (88 mcg total) by mouth daily before breakfast. Start taking on: March 07, 2021 What changed: when to take this   lidocaine 5 % Commonly known as: LIDODERM Place 1 patch onto the skin daily. Remove & Discard patch within 12 hours or as directed by MD What changed: when to take this   LORazepam 0.5 MG tablet Commonly known as: ATIVAN Take 1 tablet (0.5 mg total) by mouth every 8 (eight) hours as needed for anxiety.   losartan 25 MG tablet Commonly known as: COZAAR Take 25 mg by mouth daily.   multivitamin with minerals Tabs tablet Take 1 tablet by mouth daily. ONE-A-DAY   naloxone 2 MG/2ML injection Commonly known as: NARCAN Inject 0.4  mLs (0.4 mg total) into the vein as needed.   oxyCODONE 5 MG immediate release tablet Commonly known as: Oxy IR/ROXICODONE Take 1 tablet (5 mg total) by mouth every 6 (six) hours as needed for up to 7 days for severe pain (despite tylenol and tramadol).   polyethylene glycol 17 g packet Commonly known as: MIRALAX / GLYCOLAX Take 17 g by mouth daily.   predniSONE 20 MG tablet Commonly known as: DELTASONE Take 1.5 tablets (30 mg total) by mouth daily with breakfast for 2 days, THEN 1 tablet (20 mg total) daily with breakfast for 2 days, THEN 0.5 tablets (10 mg total) daily with breakfast for 2 days. Start taking on: March 06, 2021 What changed:  medication strength See the new instructions.   Prolia 60 MG/ML Sosy injection Generic drug: denosumab Inject 60 mg into the skin as directed. Once every 6 months.   rOPINIRole 0.25 MG tablet Commonly known as: REQUIP Take 0.25 mg by mouth 3 (three) times daily.   senna-docusate 8.6-50 MG tablet Commonly known as: Senokot-S Take 1 tablet by mouth 2 (two) times daily.   traMADol 50 MG tablet Commonly known as: ULTRAM Take 1 tablet (50 mg total) by mouth every 12 (twelve) hours.   Vitamin D (Ergocalciferol) 1.25 MG (50000 UNIT) Caps capsule Commonly known as: DRISDOL Take 50,000 Units by mouth once a week.   VITAMIN E PO Take 1 capsule by mouth daily.        Contact information for follow-up providers     Duanne Guess, PA-C Follow up in 2 week(s).   Specialties: Orthopedic Surgery, Emergency Medicine Contact information: [place] [address] [phone]              Contact information for after-discharge care     Destination     [facility] Preferred SNF .   Service: Skilled Nursing Contact information: [place] [address] [phone]                    Allergies  Allergen Reactions   Other Hives and Rash    "filler in all  generic drugs"   Dissolvable stitches-do not dissolve   Statins Other (See Comments)    Muscle weakness Other reaction(s): Other (See Comments) Other reaction(s): Other (See Comments) Muscle weakness Muscle weakness Muscle weakness    Sulfa Antibiotics Shortness Of Breath  Sulfasalazine Shortness Of Breath   Ibandronic Acid Other (See Comments)    (BONIVA) Muscle Pain   Sertraline     Other reaction(s): UNKNOWN   Hydrochlorothiazide Rash   Lidocaine Rash   Morphine Rash   Ramipril Rash   Tape Rash    Pt can tolerate paper tape.     If you experience worsening of your admission symptoms, develop shortness of breath, life threatening emergency, suicidal or homicidal thoughts you must seek medical attention immediately by calling 911 or calling your MD immediately  if symptoms less severe.    Please note   You were cared for by a hospitalist during your hospital stay. If you have any questions about your discharge medications or the care you received while you were in the hospital after you are discharged, you can call the unit and asked to speak with the hospitalist on call if the hospitalist that took care of you is not available. Once you are discharged, your primary care physician will handle any further medical issues. Please note that NO REFILLS for any discharge medications will be authorized once you are discharged, as it is imperative that you return to your primary care physician (or establish a relationship with a primary care physician if you do not have one) for your aftercare needs so that they can reassess your need for medications and monitor your lab values.   Consultations: Orthopedic surgery   Procedures/Studies: DG Lumbar Spine 2-3 Views  Result Date: 02/26/2021 CLINICAL DATA:  Surgery, elective. Additional history provided: Kypho lumbar. Provided fluoroscopy time 1 minutes, 30 seconds. EXAM: LUMBAR SPINE - 2-3 VIEW; DG C-ARM 1-60 MIN COMPARISON:  Lumbar  spine MRI 02/21/2021. FINDINGS: Seven intraprocedural fluoroscopic images of the lumbar spine are submitted (oblique and lateral projection). The lowest well-formed intervertebral disc space is designated L5-S1. On the provided images, a trocar extends into the L4 lumbar vertebra (site of known compression fracture). Kyphoplasty material is present within the L4 vertebra. On the later acquired images, some of the kyphoplasty material extends beyond the margins of the L4 vertebra (laterally and towards the L4-L5 disc space). IMPRESSION: Seven intraprocedural fluoroscopic images of the lumbar spine from L4 kyphoplasty, as described. Electronically Signed   By: Kellie Simmering DO   On: 02/26/2021 14:36   MR LUMBAR SPINE WO CONTRAST  Result Date: 02/21/2021 CLINICAL DATA:  Back pain EXAM: MRI LUMBAR SPINE WITHOUT CONTRAST TECHNIQUE: Multiplanar, multisequence MR imaging of the lumbar spine was performed. No intravenous contrast was administered. COMPARISON:  MRI 07/31/2020, CT 09/25/2020. FINDINGS: Segmentation:  Standard. Alignment: Slight retrolisthesis of the L4 vertebral body with bony retropulsion at the superior endplate. There is also slight retrolisthesis of L1 on L2. Vertebrae: New moderate superior endplate compression fracture of L4 with approximately 50% vertebral body height loss. 5-6 mm of bony retropulsion at the superior endplate without associated canal stenosis. Mild marrow edema throughout the L4 vertebral body. Fracture line does not appear to extend into the posterior elements. Remaining lumbar vertebral levels demonstrate preserved vertebral body height without additional fracture. Postsurgical changes from previous lumbar posterior decompression. No evidence of discitis. No suspicious bone lesion. Conus medullaris and cauda equina: Conus extends to the L1 level. Conus and cauda equina appear normal. Paraspinal and other soft tissues: Negative. Disc levels: T12-L1: No significant disc protrusion,  foraminal stenosis, or canal stenosis. Unchanged. L1-L2: Mild diffuse disc bulge with right far lateral disc osteophyte complex. Mild bilateral facet arthropathy and ligamentum flavum buckling. Borderline-mild canal stenosis with  mild bilateral subarticular recess stenosis and mild bilateral foraminal stenosis. Unchanged. L2-L3: Prior posterior decompression. Mild circumferential disc bulge. Bilateral facet arthropathy. Mild bilateral foraminal stenosis without canal stenosis. Unchanged. L3-L4: Prior posterior decompression bony retropulsion of the superior endplate of L4. Diffuse disc bulge with right greater than left biforaminal protrusions. Bilateral facet arthropathy. Severe right and mild-to-moderate left foraminal stenosis. No canal stenosis. Findings progressed from prior. L4-L5: Prior posterior decompression. Mild circumferential disc bulge with small left foraminal protrusion. Mild bilateral facet arthropathy. No canal or foraminal stenosis. Unchanged. L5-S1: Prior posterior decompression. Minimal disc osteophyte complex and mild bilateral facet arthropathy. No foraminal or canal stenosis. Unchanged. IMPRESSION: 1. New acute-to-subacute moderate superior endplate compression fracture of L4 with approximately 50% vertebral body height loss and 5-6 mm of bony retropulsion at the superior endplate. No associated canal stenosis. 2. Severe right and mild-to-moderate left foraminal stenosis at L3-L4, progressed from prior. 3. Borderline-mild canal stenosis at L1-L2, unchanged. 4. Prior posterior decompression at L2-L3, L3-L4, and L4-L5 without residual canal stenosis. Electronically Signed   By: Davina Poke D.O.   On: 02/21/2021 16:47   DG C-Arm 1-60 Min  Result Date: 02/26/2021 CLINICAL DATA:  Surgery, elective. Additional history provided: Kypho lumbar. Provided fluoroscopy time 1 minutes, 30 seconds. EXAM: LUMBAR SPINE - 2-3 VIEW; DG C-ARM 1-60 MIN COMPARISON:  Lumbar spine MRI 02/21/2021. FINDINGS:  Seven intraprocedural fluoroscopic images of the lumbar spine are submitted (oblique and lateral projection). The lowest well-formed intervertebral disc space is designated L5-S1. On the provided images, a trocar extends into the L4 lumbar vertebra (site of known compression fracture). Kyphoplasty material is present within the L4 vertebra. On the later acquired images, some of the kyphoplasty material extends beyond the margins of the L4 vertebra (laterally and towards the L4-L5 disc space). IMPRESSION: Seven intraprocedural fluoroscopic images of the lumbar spine from L4 kyphoplasty, as described. Electronically Signed   By: Kellie Simmering DO   On: 02/26/2021 14:36   [place] RT LOWER EXTREM LTD SOFT TISSUE NON VASCULAR  Result Date: 02/27/2021 CLINICAL DATA:  Right groin discomfort for 3 weeks. EXAM: ULTRASOUND RIGHT LOWER EXTREMITY LIMITED TECHNIQUE: Ultrasound examination of the lower extremity soft tissues was performed in the area of clinical concern. COMPARISON:  None. FINDINGS: In the area of pain indicated by the patient in the right groin is a 17 x 7 x 10 mm lymph node with normal sonographic morphology. No other mass, fluid collection, or hernia is identified. IMPRESSION: Normal appearing lymph node in the right groin. Electronically Signed   By: Logan Bores M.D.   On: 02/27/2021 16:11    Kyphoplasty   Subjective: Pt sleeping but woke easily.  Husband at bedside.  Pt denies having pain right now, but has not been up today.  She is disoriented, saying we are in a hotel in [place].     Discharge Exam: Vitals:   03/06/21 0338 03/06/21 0746  BP: (!) 103/92 134/78  Pulse: 66 68  Resp: 16 18  Temp: 97.8 F (36.6 C) 97.9 F (36.6 C)  SpO2: 97% 94%   Vitals:   03/06/21 0200 03/06/21 0338 03/06/21 0500 03/06/21 0746  BP: 125/73 (!) 103/92  134/78  Pulse: 74 66  68  Resp: 18 16  18   Temp:  97.8 F (36.6 C)  97.9 F (36.6 C)  TempSrc:    Oral  SpO2:  97%  94%  Weight:   79.5 kg   Height:  General: Pt is alert, awake, not in acute distress Cardiovascular: RRR, S1/S2 +, no rubs, no gallops Respiratory: CTA bilaterally, no wheezing, no rhonchi Abdominal: Soft, NT, ND, bowel sounds + Extremities: no edema, no cyanosis    The results of significant diagnostics from this hospitalization (including imaging, microbiology, ancillary and laboratory) are listed below for reference.     Microbiology: Recent Results (from the past 240 hour(s))  Resp Panel by RT-PCR (Flu A&B, Covid)     Status: None   Collection Time: 02/26/21  2:22 AM   Specimen: Nasopharyngeal(NP) swabs in vial transport medium  Result Value Ref Range Status   SARS Coronavirus 2 by RT PCR NEGATIVE NEGATIVE Final    Comment: (NOTE) SARS-CoV-2 target nucleic acids are NOT DETECTED.  The SARS-CoV-2 RNA is generally detectable in upper respiratory specimens during the acute phase of infection. The lowest concentration of SARS-CoV-2 viral copies this assay can detect is 138 copies/mL. A negative result does not preclude SARS-Cov-2 infection and should not be used as the sole basis for treatment or other patient management decisions. A negative result may occur with  improper specimen collection/handling, submission of specimen other than nasopharyngeal swab, presence of viral mutation(s) within the areas targeted by this assay, and inadequate number of viral copies(<138 copies/mL). A negative result must be combined with clinical observations, patient history, and epidemiological information. The expected result is Negative.  Fact Sheet for Patients:  EntrepreneurPulse.com.au  Fact Sheet for Healthcare Providers:  IncredibleEmployment.be  This test is no t yet approved or cleared by the [place] FDA and  has been authorized for detection and/or diagnosis of SARS-CoV-2 by FDA under an Emergency Use Authorization (EUA). This EUA will remain  in effect (meaning  this test can be used) for the duration of the COVID-19 declaration under Section 564(b)(1) of the Act, 21 U.S.C.section 360bbb-3(b)(1), unless the authorization is terminated  or revoked sooner.       Influenza A by PCR NEGATIVE NEGATIVE Final   Influenza B by PCR NEGATIVE NEGATIVE Final    Comment: (NOTE) The Xpert Xpress SARS-CoV-2/FLU/RSV plus assay is intended as an aid in the diagnosis of influenza from Nasopharyngeal swab specimens and should not be used as a sole basis for treatment. Nasal washings and aspirates are unacceptable for Xpert Xpress SARS-CoV-2/FLU/RSV testing.  Fact Sheet for Patients: EntrepreneurPulse.com.au  Fact Sheet for Healthcare Providers: IncredibleEmployment.be  This test is not yet approved or cleared by the [place] FDA and has been authorized for detection and/or diagnosis of SARS-CoV-2 by FDA under an Emergency Use Authorization (EUA). This EUA will remain in effect (meaning this test can be used) for the duration of the COVID-19 declaration under Section 564(b)(1) of the Act, 21 U.S.C. section 360bbb-3(b)(1), unless the authorization is terminated or revoked.  Performed at [facility], [place], [place] [address]   SARS CORONAVIRUS 2 (TAT 6-24 HRS) Nasopharyngeal Nasopharyngeal Swab     Status: None   Collection Time: 02/28/21  6:31 PM   Specimen: Nasopharyngeal Swab  Result Value Ref Range Status   SARS Coronavirus 2 NEGATIVE NEGATIVE Final    Comment: (NOTE) SARS-CoV-2 target nucleic acids are NOT DETECTED.  The SARS-CoV-2 RNA is generally detectable in upper and lower respiratory specimens during the acute phase of infection. Negative results do not preclude SARS-CoV-2 infection, do not rule out co-infections with other pathogens, and should not be used as the sole basis for treatment or other patient management decisions. Negative results must be  combined with  clinical observations, patient history, and epidemiological information. The expected result is Negative.  Fact Sheet for Patients: SugarRoll.be  Fact Sheet for Healthcare Providers: https://www.woods-mathews.com/  This test is not yet approved or cleared by the [place] FDA and  has been authorized for detection and/or diagnosis of SARS-CoV-2 by FDA under an Emergency Use Authorization (EUA). This EUA will remain  in effect (meaning this test can be used) for the duration of the COVID-19 declaration under Se ction 564(b)(1) of the Act, 21 U.S.C. section 360bbb-3(b)(1), unless the authorization is terminated or revoked sooner.  Performed at [facility], [place] [address], [place], [place] [address]   SARS CORONAVIRUS 2 (TAT 6-24 HRS) Nasopharyngeal Nasopharyngeal Swab     Status: None   Collection Time: 03/05/21  9:40 AM   Specimen: Nasopharyngeal Swab  Result Value Ref Range Status   SARS Coronavirus 2 NEGATIVE NEGATIVE Final    Comment: (NOTE) SARS-CoV-2 target nucleic acids are NOT DETECTED.  The SARS-CoV-2 RNA is generally detectable in upper and lower respiratory specimens during the acute phase of infection. Negative results do not preclude SARS-CoV-2 infection, do not rule out co-infections with other pathogens, and should not be used as the sole basis for treatment or other patient management decisions. Negative results must be combined with clinical observations, patient history, and epidemiological information. The expected result is Negative.  Fact Sheet for Patients: SugarRoll.be  Fact Sheet for Healthcare Providers: https://www.woods-mathews.com/  This test is not yet approved or cleared by the [place] FDA and  has been authorized for detection and/or diagnosis of SARS-CoV-2 by FDA under an Emergency Use Authorization (EUA). This EUA will remain  in effect  (meaning this test can be used) for the duration of the COVID-19 declaration under Se ction 564(b)(1) of the Act, 21 U.S.C. section 360bbb-3(b)(1), unless the authorization is terminated or revoked sooner.  Performed at [facility], [place] [address] [place], [place] [address]      Labs: BNP (last 3 results) No results for input(s): BNP in the last 8760 hours. Basic Metabolic Panel: Recent Labs  Lab 03/01/21 0446 03/03/21 0526 03/05/21 0528  NA 139 140 139  K 3.6 3.5 3.6  CL 99 100 102  CO2 31 30 30   GLUCOSE 101* 100* 102*  BUN 15 15 17   CREATININE 0.60 0.52 0.59  CALCIUM 9.7 9.8 9.8   Liver Function Tests: No results for input(s): AST, ALT, ALKPHOS, BILITOT, PROT, ALBUMIN in the last 168 hours. No results for input(s): LIPASE, AMYLASE in the last 168 hours. No results for input(s): AMMONIA in the last 168 hours. CBC: No results for input(s): WBC, NEUTROABS, HGB, HCT, MCV, PLT in the last 168 hours. Cardiac Enzymes: No results for input(s): CKTOTAL, CKMB, CKMBINDEX, TROPONINI in the last 168 hours. BNP: Invalid input(s): POCBNP CBG: No results for input(s): GLUCAP in the last 168 hours. D-Dimer No results for input(s): DDIMER in the last 72 hours. Hgb A1c No results for input(s): HGBA1C in the last 72 hours. Lipid Profile No results for input(s): CHOL, HDL, LDLCALC, TRIG, CHOLHDL, LDLDIRECT in the last 72 hours. Thyroid function studies No results for input(s): TSH, T4TOTAL, T3FREE, THYROIDAB in the last 72 hours.  Invalid input(s): FREET3 Anemia work up No results for input(s): VITAMINB12, FOLATE, FERRITIN, TIBC, IRON, RETICCTPCT in the last 72 hours. Urinalysis    Component Value Date/Time   COLORURINE YELLOW (A) 02/26/2021 0222   APPEARANCEUR HAZY (A) 02/26/2021 0222   APPEARANCEUR  Clear 12/31/2013 1542   LABSPEC 1.010 02/26/2021 0222   LABSPEC 1.004 12/31/2013 1542   PHURINE 7.0 02/26/2021 0222   GLUCOSEU NEGATIVE 02/26/2021 0222   GLUCOSEU  Negative 12/31/2013 1542   HGBUR NEGATIVE 02/26/2021 0222   BILIRUBINUR NEGATIVE 02/26/2021 0222   BILIRUBINUR Negative 12/31/2013 [place] 02/26/2021 0222   PROTEINUR NEGATIVE 02/26/2021 0222   NITRITE NEGATIVE 02/26/2021 0222   LEUKOCYTESUR NEGATIVE 02/26/2021 0222   LEUKOCYTESUR Negative 12/31/2013 1542   Sepsis Labs Invalid input(s): PROCALCITONIN,  WBC,  LACTICIDVEN Microbiology Recent Results (from the past 240 hour(s))  Resp Panel by RT-PCR (Flu A&B, Covid)     Status: None   Collection Time: 02/26/21  2:22 AM   Specimen: Nasopharyngeal(NP) swabs in vial transport medium  Result Value Ref Range Status   SARS Coronavirus 2 by RT PCR NEGATIVE NEGATIVE Final    Comment: (NOTE) SARS-CoV-2 target nucleic acids are NOT DETECTED.  The SARS-CoV-2 RNA is generally detectable in upper respiratory specimens during the acute phase of infection. The lowest concentration of SARS-CoV-2 viral copies this assay can detect is 138 copies/mL. A negative result does not preclude SARS-Cov-2 infection and should not be used as the sole basis for treatment or other patient management decisions. A negative result may occur with  improper specimen collection/handling, submission of specimen other than nasopharyngeal swab, presence of viral mutation(s) within the areas targeted by this assay, and inadequate number of viral copies(<138 copies/mL). A negative result must be combined with clinical observations, patient history, and epidemiological information. The expected result is Negative.  Fact Sheet for Patients:  EntrepreneurPulse.com.au  Fact Sheet for Healthcare Providers:  IncredibleEmployment.be  This test is no t yet approved or cleared by the [place] FDA and  has been authorized for detection and/or diagnosis of SARS-CoV-2 by FDA under an Emergency Use Authorization (EUA). This EUA will remain  in effect (meaning this test can  be used) for the duration of the COVID-19 declaration under Section 564(b)(1) of the Act, 21 U.S.C.section 360bbb-3(b)(1), unless the authorization is terminated  or revoked sooner.       Influenza A by PCR NEGATIVE NEGATIVE Final   Influenza B by PCR NEGATIVE NEGATIVE Final    Comment: (NOTE) The Xpert Xpress SARS-CoV-2/FLU/RSV plus assay is intended as an aid in the diagnosis of influenza from Nasopharyngeal swab specimens and should not be used as a sole basis for treatment. Nasal washings and aspirates are unacceptable for Xpert Xpress SARS-CoV-2/FLU/RSV testing.  Fact Sheet for Patients: EntrepreneurPulse.com.au  Fact Sheet for Healthcare Providers: IncredibleEmployment.be  This test is not yet approved or cleared by the [place] FDA and has been authorized for detection and/or diagnosis of SARS-CoV-2 by FDA under an Emergency Use Authorization (EUA). This EUA will remain in effect (meaning this test can be used) for the duration of the COVID-19 declaration under Section 564(b)(1) of the Act, 21 U.S.C. section 360bbb-3(b)(1), unless the authorization is terminated or revoked.  Performed at [facility], [place], [place] [address]   SARS CORONAVIRUS 2 (TAT 6-24 HRS) Nasopharyngeal Nasopharyngeal Swab     Status: None   Collection Time: 02/28/21  6:31 PM   Specimen: Nasopharyngeal Swab  Result Value Ref Range Status   SARS Coronavirus 2 NEGATIVE NEGATIVE Final    Comment: (NOTE) SARS-CoV-2 target nucleic acids are NOT DETECTED.  The SARS-CoV-2 RNA is generally detectable in upper and lower respiratory specimens during the acute phase of infection. Negative results do not  preclude SARS-CoV-2 infection, do not rule out co-infections with other pathogens, and should not be used as the sole basis for treatment or other patient management decisions. Negative results must be combined with clinical  observations, patient history, and epidemiological information. The expected result is Negative.  Fact Sheet for Patients: SugarRoll.be  Fact Sheet for Healthcare Providers: https://www.woods-mathews.com/  This test is not yet approved or cleared by the [place] FDA and  has been authorized for detection and/or diagnosis of SARS-CoV-2 by FDA under an Emergency Use Authorization (EUA). This EUA will remain  in effect (meaning this test can be used) for the duration of the COVID-19 declaration under Se ction 564(b)(1) of the Act, 21 U.S.C. section 360bbb-3(b)(1), unless the authorization is terminated or revoked sooner.  Performed at [facility], [place] [address] [place], [place] [address]   SARS CORONAVIRUS 2 (TAT 6-24 HRS) Nasopharyngeal Nasopharyngeal Swab     Status: None   Collection Time: 03/05/21  9:40 AM   Specimen: Nasopharyngeal Swab  Result Value Ref Range Status   SARS Coronavirus 2 NEGATIVE NEGATIVE Final    Comment: (NOTE) SARS-CoV-2 target nucleic acids are NOT DETECTED.  The SARS-CoV-2 RNA is generally detectable in upper and lower respiratory specimens during the acute phase of infection. Negative results do not preclude SARS-CoV-2 infection, do not rule out co-infections with other pathogens, and should not be used as the sole basis for treatment or other patient management decisions. Negative results must be combined with clinical observations, patient history, and epidemiological information. The expected result is Negative.  Fact Sheet for Patients: SugarRoll.be  Fact Sheet for Healthcare Providers: https://www.woods-mathews.com/  This test is not yet approved or cleared by the [place] FDA and  has been authorized for detection and/or diagnosis of SARS-CoV-2 by FDA under an Emergency Use Authorization (EUA). This EUA will remain  in effect (meaning this  test can be used) for the duration of the COVID-19 declaration under Se ction 564(b)(1) of the Act, 21 U.S.C. section 360bbb-3(b)(1), unless the authorization is terminated or revoked sooner.  Performed at [facility], [place] [address] [place], [place] [address]      Time coordinating discharge: Over 30 minutes  SIGNED:   Ezekiel Slocumb, DO [facility] 03/06/2021, 10:30 AM   If 7PM-7AM, please contact night-coverage [email or web address]

## 2021-03-06 NOTE — Progress Notes (Signed)
[facility] arrived. [facility] to [facility].

## 2021-03-06 NOTE — Progress Notes (Signed)
Attempted to contact [facility] 3 times to report incoming pt. Attempted calls not successful.

## 2021-03-06 NOTE — TOC Progression Note (Signed)
Transition of Care [facility]) - Progression Note    Patient Details  Name: Susan Salas MRN: [id number] Date of Birth: [birth year]  [facility] CM/SW Contact  Su Hilt, RN Phone Number: 03/06/2021, 11:07 AM  Clinical Narrative:    Spoke to the patient and her husband in the room and notified them of the DC for today, she stated that she would feel better transporting in non urgent EMS staed of the car, she stated she would feel safer.  She understands if insurance does not pay she will be responsible for the bill        Expected Discharge Plan and Services           Expected Discharge Date: 03/06/21                                     Social Determinants of Health (SDOH) Interventions    Readmission Risk Interventions No flowsheet data found.

## 2021-03-09 ENCOUNTER — Other Ambulatory Visit (HOSPITAL_COMMUNITY): Payer: Self-pay | Admitting: Orthopedic Surgery

## 2021-03-09 ENCOUNTER — Other Ambulatory Visit: Payer: Self-pay | Admitting: Orthopedic Surgery

## 2021-03-09 DIAGNOSIS — Z9889 Other specified postprocedural states: Secondary | ICD-10-CM

## 2021-03-09 DIAGNOSIS — M533 Sacrococcygeal disorders, not elsewhere classified: Secondary | ICD-10-CM

## 2021-05-08 ENCOUNTER — Other Ambulatory Visit: Payer: Self-pay | Admitting: Orthopedic Surgery

## 2021-05-08 DIAGNOSIS — M533 Sacrococcygeal disorders, not elsewhere classified: Secondary | ICD-10-CM

## 2021-05-08 DIAGNOSIS — Z9889 Other specified postprocedural states: Secondary | ICD-10-CM

## 2021-05-10 ENCOUNTER — Ambulatory Visit
Admission: RE | Admit: 2021-05-10 | Discharge: 2021-05-10 | Disposition: A | Discharge status: Home / Self Care | Payer: Medicare Other | Source: Ambulatory Visit | Attending: Orthopedic Surgery | Admitting: Orthopedic Surgery

## 2021-05-10 ENCOUNTER — Other Ambulatory Visit: Payer: Self-pay

## 2021-05-10 DIAGNOSIS — Z9889 Other specified postprocedural states: Secondary | ICD-10-CM | POA: Diagnosis present

## 2021-05-10 DIAGNOSIS — M533 Sacrococcygeal disorders, not elsewhere classified: Secondary | ICD-10-CM | POA: Insufficient documentation

## 2021-05-15 ENCOUNTER — Other Ambulatory Visit: Payer: Self-pay | Admitting: Physical Medicine & Rehabilitation

## 2021-05-15 DIAGNOSIS — M48062 Spinal stenosis, lumbar region with neurogenic claudication: Secondary | ICD-10-CM

## 2021-05-15 DIAGNOSIS — G8929 Other chronic pain: Secondary | ICD-10-CM

## 2021-05-25 ENCOUNTER — Ambulatory Visit
Admission: RE | Admit: 2021-05-25 | Discharge: 2021-05-25 | Disposition: A | Discharge status: Home / Self Care | Payer: Medicare Other | Source: Ambulatory Visit | Attending: Physical Medicine & Rehabilitation | Admitting: Physical Medicine & Rehabilitation

## 2021-05-25 ENCOUNTER — Other Ambulatory Visit: Payer: Self-pay

## 2021-05-25 DIAGNOSIS — M48062 Spinal stenosis, lumbar region with neurogenic claudication: Secondary | ICD-10-CM | POA: Diagnosis present

## 2021-05-25 DIAGNOSIS — M5442 Lumbago with sciatica, left side: Secondary | ICD-10-CM | POA: Insufficient documentation

## 2021-05-25 DIAGNOSIS — G8929 Other chronic pain: Secondary | ICD-10-CM | POA: Insufficient documentation

## 2021-05-25 DIAGNOSIS — M5441 Lumbago with sciatica, right side: Secondary | ICD-10-CM | POA: Diagnosis present

## 2021-06-22 ENCOUNTER — Other Ambulatory Visit: Payer: Self-pay | Admitting: Neurosurgery

## 2021-06-26 ENCOUNTER — Other Ambulatory Visit (HOSPITAL_BASED_OUTPATIENT_CLINIC_OR_DEPARTMENT_OTHER): Payer: Self-pay | Admitting: Neurosurgery

## 2021-06-26 ENCOUNTER — Other Ambulatory Visit: Payer: Self-pay | Admitting: Neurosurgery

## 2021-06-26 DIAGNOSIS — G20 Parkinson's disease: Secondary | ICD-10-CM

## 2021-06-26 DIAGNOSIS — R2689 Other abnormalities of gait and mobility: Secondary | ICD-10-CM

## 2021-07-06 ENCOUNTER — Ambulatory Visit: Payer: Medicare Other

## 2021-07-06 ENCOUNTER — Emergency Department
Admission: EM | Admit: 2021-07-06 | Discharge: 2021-07-06 | Disposition: A | Discharge status: Home / Self Care | Payer: Medicare Other | Attending: Emergency Medicine | Admitting: Emergency Medicine

## 2021-07-06 ENCOUNTER — Other Ambulatory Visit: Payer: Self-pay

## 2021-07-06 DIAGNOSIS — Z87891 Personal history of nicotine dependence: Secondary | ICD-10-CM | POA: Insufficient documentation

## 2021-07-06 DIAGNOSIS — Z7982 Long term (current) use of aspirin: Secondary | ICD-10-CM | POA: Diagnosis not present

## 2021-07-06 DIAGNOSIS — K08409 Partial loss of teeth, unspecified cause, unspecified class: Secondary | ICD-10-CM | POA: Insufficient documentation

## 2021-07-06 DIAGNOSIS — I5032 Chronic diastolic (congestive) heart failure: Secondary | ICD-10-CM | POA: Insufficient documentation

## 2021-07-06 DIAGNOSIS — Z79899 Other long term (current) drug therapy: Secondary | ICD-10-CM | POA: Diagnosis not present

## 2021-07-06 DIAGNOSIS — E039 Hypothyroidism, unspecified: Secondary | ICD-10-CM | POA: Diagnosis not present

## 2021-07-06 DIAGNOSIS — I110 Hypertensive heart disease with heart failure: Secondary | ICD-10-CM | POA: Insufficient documentation

## 2021-07-06 DIAGNOSIS — Z8543 Personal history of malignant neoplasm of ovary: Secondary | ICD-10-CM | POA: Diagnosis not present

## 2021-07-06 NOTE — ED Provider Notes (Signed)
[facility] Emergency Department Provider Note ____________________________________________  Time seen: 2120  I have reviewed the triage vital signs and the nursing notes.  HISTORY  Chief Complaint  Dental Problem (Bleeding gums following teeth extraction )   HPI Susan Salas is a 85 y.o. female presents to the ED via EMS from home, after dental procedure this morning.  Into the patient she would note that bleeding had not completely resolved in the postop period prior to leaving the office.  She went home, and took a nap, before awakening to find herself with blood in the bed and blood in her mouth.  EMS reported to the house, the patient had endorsed bleeding since about 10 AM.  Went to EMS, the bleeding amount was seen moderate, but was controlled with gauze packing.  Patient denies any blood thinner use, nausea, vomiting, or dizziness patient denies any chest pain, fevers, chills, sweats.  Patient presents now with bleeding appear to be controlled, with dried blood noted around the patient's mouth.  Past Medical History:  Diagnosis Date   Cancer ([place]) 2017   ovarian   CHF (congestive heart failure) (HCC)    Complication of anesthesia    hard to wake from general anesthesia, long porcedures nausea and vomiting.   Hypertension    Hypothyroidism    PONV (postoperative nausea and vomiting)    Spinal headache    with labor of 38 year old son.    Patient Active Problem List   Diagnosis Date Noted   Closed compression fracture of L4 lumbar vertebra, initial encounter ([place]) 02/26/2021   Low back pain 02/26/2021   Chronic diastolic CHF (congestive heart failure) ([place]) 02/26/2021   Parkinsonism ([place]) 02/26/2021   Hypertension    Osteoporosis    Hypothyroidism    OSA (obstructive sleep apnea) 03/17/2019   Amputated toe of left foot ([place]) 05/29/2017   Lumbar stenosis with neurogenic claudication 04/10/2015   Bowel perforation ([place]) 01/11/2014    Past  Surgical History:  Procedure Laterality Date   ABDOMINAL HYSTERECTOMY     AMPUTATION TOE Left 05/02/2017   Procedure: AMPUTATION TOE/Left 4th [id number]/28820;  Surgeon: Sharlotte Alamo, DPM;  Location: [facility] ORS;  Service: Podiatry;  Laterality: Left;   APPENDECTOMY     BACK SURGERY  2014   lamenectomy   BREAST SURGERY Bilateral 2003   reduction   CATARACT EXTRACTION W/ INTRAOCULAR LENS IMPLANT Bilateral 2014   CHOLECYSTECTOMY     COLON SURGERY  2017   FRACTURE SURGERY Right 2003   screws & plates   KYPHOPLASTY N/A 02/26/2021   Procedure: L4 KYPHOPLASTY;  Surgeon: Hessie Knows, MD;  Location: [facility] ORS;  Service: Orthopedics;  Laterality: N/A;   TONSILLECTOMY      Prior to Admission medications   Medication Sig Start Date End Date Taking? Authorizing Provider  acetaminophen (TYLENOL) 500 MG tablet Take 2 tablets (1,000 mg total) by mouth every 8 (eight) hours. 03/06/21   Ezekiel Slocumb, DO  aspirin 81 MG EC tablet Take 81 mg by mouth daily.    [provider]  baclofen (LIORESAL) 10 MG tablet Take 5 mg by mouth 2 (two) times daily. 08/16/20   [provider]  bisacodyl (DULCOLAX) 5 MG EC tablet Take 1 tablet (5 mg total) by mouth daily as needed for moderate constipation. 03/06/21   Nicole Kindred A, DO  calcitonin, salmon, (MIACALCIN/FORTICAL) 200 UNIT/ACT nasal spray Place 1 spray into alternate nostrils daily. 03/06/21   Ezekiel Slocumb, DO  Calcium  Carb-Cholecalciferol 600-400 MG-UNIT TABS Take 1 tablet by mouth 2 (two) times daily with a meal.    [provider]  COREG CR 10 MG 24 hr capsule Take 10 mg by mouth at bedtime. 02/23/17   [provider]  denosumab (PROLIA) 60 MG/ML SOSY injection Inject 60 mg into the skin as directed. Once every 6 months. 11/13/20   [provider]  diclofenac Sodium (VOLTAREN) 1 % GEL Apply 2 g topically daily as needed. 08/28/18   [provider]  DULoxetine (CYMBALTA) 30 MG capsule Take 60 mg by mouth daily.  02/05/21   [provider]  feeding supplement (ENSURE ENLIVE / ENSURE PLUS) LIQD Take 237 mLs by mouth 2 (two) times daily between meals. 03/06/21   Ezekiel Slocumb, DO  furosemide (LASIX) 40 MG tablet Take 40 mg by mouth daily as needed for edema. 01/28/19   [provider]  gabapentin (NEURONTIN) 300 MG capsule Take 2 capsules (600 mg total) by mouth 3 (three) times daily. 03/06/21   Nicole Kindred A, DO  ibuprofen (ADVIL) 200 MG tablet Take 200 mg by mouth every 6 (six) hours as needed.    [provider]  levothyroxine (SYNTHROID) 88 MCG tablet Take 1 tablet (88 mcg total) by mouth daily before breakfast. 03/07/21   Nicole Kindred A, DO  lidocaine (LIDODERM) 5 % Place 1 patch onto the skin daily. Remove & Discard patch within 12 hours or as directed by MD 03/06/21   Nicole Kindred A, DO  LORazepam (ATIVAN) 0.5 MG tablet Take 1 tablet (0.5 mg total) by mouth every 8 (eight) hours as needed for anxiety. 03/06/21   Ezekiel Slocumb, DO  losartan (COZAAR) 25 MG tablet Take 25 mg by mouth daily.    [provider]  Multiple Vitamin (MULTIVITAMIN WITH MINERALS) TABS tablet Take 1 tablet by mouth daily. ONE-A-DAY    [provider]  naloxone [facility]) 2 MG/2ML injection Inject 0.4 mLs (0.4 mg total) into the vein as needed. 03/06/21   Ezekiel Slocumb, DO  polyethylene glycol (MIRALAX / GLYCOLAX) 17 g packet Take 17 g by mouth daily. 03/06/21   Ezekiel Slocumb, DO  rOPINIRole (REQUIP) 0.25 MG tablet Take 0.25 mg by mouth 3 (three) times daily. 01/31/21   [provider]  senna-docusate (SENOKOT-S) 8.6-50 MG tablet Take 1 tablet by mouth 2 (two) times daily. 03/06/21   Ezekiel Slocumb, DO  traMADol (ULTRAM) 50 MG tablet Take 1 tablet (50 mg total) by mouth every 12 (twelve) hours. 03/06/21   Ezekiel Slocumb, DO  Vitamin D, Ergocalciferol, (DRISDOL) 1.25 MG (50000 UNIT) CAPS capsule Take 50,000 Units by mouth once a week. 02/04/21   [provider]   VITAMIN E PO Take 1 capsule by mouth daily.    [provider]    Allergies Other, Statins, Sulfa antibiotics, Sulfasalazine, Ibandronic acid, Sertraline, Hydrochlorothiazide, Lidocaine, Morphine, Ramipril, and Tape  History reviewed. No pertinent family history.  Social History Social History   Tobacco Use   Smoking status: Former    Types: Cigarettes    Quit date: 04/29/1983    Years since quitting: 38.2   Smokeless tobacco: Never  Vaping Use   Vaping Use: Never used  Substance Use Topics   Alcohol use: No   Drug use: No    Review of Systems  Constitutional: Negative for fever. Eyes: Negative for visual changes. ENT: Negative for sore throat.  History of dental extraction to the lower jaw. Cardiovascular: Negative  for chest pain. Respiratory: Negative for shortness of breath. Gastrointestinal: Negative for abdominal pain, vomiting and diarrhea. Genitourinary: Negative for dysuria. Musculoskeletal: Negative for back pain. Skin: Negative for rash. Neurological: Negative for headaches, focal weakness or numbness. ____________________________________________  PHYSICAL EXAM:  VITAL SIGNS: ED Triage Vitals  Enc Vitals Group     BP 07/06/21 1924 113/83     Pulse Rate 07/06/21 1924 91     Resp 07/06/21 1924 18     Temp 07/06/21 1924 97.7 F (36.5 C)     Temp Source 07/06/21 1924 Oral     SpO2 07/06/21 1924 94 %     Weight 07/06/21 1925 197 lb (89.4 kg)     Height --      Head Circumference --      [facility] Flow --      Pain Score 07/06/21 1925 0     Pain Loc --      Pain Edu? --      Excl. in [place]? --     Constitutional: Alert and oriented. Well appearing and in no distress. Head: Normocephalic and atraumatic. Eyes: Conjunctivae are normal. PERRL. Normal extraocular movements Ears: Canals clear. TMs intact bilaterally. Nose: No congestion/rhinorrhea/epistaxis. Mouth/Throat: Mucous membranes are moist.  Uvula is midline and tonsils are flat.  No active  bleeding is appreciated.  Patient with evidence of dental extraction to 2 separate teeth on the right lower jaw.  Sockets are moist with no active bleeding appreciated. Neck: Supple. No thyromegaly. Hematological/Lymphatic/Immunological: No cervical lymphadenopathy. Cardiovascular: Normal rate, regular rhythm. Normal distal pulses. Respiratory: Normal respiratory effort. No wheezes/rales/rhonchi. Gastrointestinal: Soft and nontender. No distention. Musculoskeletal: Nontender with normal range of motion in all extremities.  Neurologic:  Normal gait without ataxia. Normal speech and language. No gross focal neurologic deficits are appreciated. Skin:  Skin is warm, dry and intact. No rash noted. Psychiatric: Mood and affect are normal. Patient exhibits appropriate insight and judgment. ____________________________________________    {LABS (pertinent positives/negatives)  Labs Reviewed - No data to display  ____________________________________________  {EKG  ____________________________________________   RADIOLOGY Official radiology report(s): No results found. ____________________________________________  PROCEDURES  Gauze packing to sockets.   Procedures ____________________________________________   INITIAL IMPRESSION / ASSESSMENT AND PLAN / ED COURSE  As part of my medical decision making, I reviewed the following data within the electronic MEDICAL RECORD NUMBER History obtained from family and Notes from prior ED visits   Geriatric patient presents to the ED, by husband from home for evaluation of ongoing bleeding after recent dental procedure.  Patient presents in stable condition and no other bleeding is appreciated the time of this evaluation.  The sockets are not dry and do not appear reactive bleeding at this time.  Patient is overall reassured at this time.  She is discharged follow-up with primary dental provider or return to the ED for worsening symptoms.  Patient is  advised to continue with gauze packing until tomorrow when she may then use salt water gargles.   KEERSTIN BJELLAND was evaluated in Emergency Department on 07/06/2021 for the symptoms described in the history of present illness. She was evaluated in the context of the global COVID-19 pandemic, which necessitated consideration that the patient might be at risk for infection with the SARS-CoV-2 virus that causes COVID-19. Institutional protocols and algorithms that pertain to the evaluation of patients at risk for COVID-19 are in a state of rapid change based on information released by regulatory bodies including the CDC and federal and state  organizations. These policies and algorithms were followed during the patient's care in the ED. ____________________________________________  FINAL CLINICAL IMPRESSION(S) / ED DIAGNOSES  Final diagnoses:  History of tooth extraction, unspecified edentulism class      [place] Rollyson, Dannielle Karvonen, PA-C 07/06/21 2357    Nance Pear, MD 07/07/21 [phone]

## 2021-07-06 NOTE — Discharge Instructions (Signed)
Continue to apply gauze as needed to prevent bleeding.  Follow-up with your dental provider For ongoing symptoms.

## 2021-07-06 NOTE — ED Triage Notes (Addendum)
Pt presents to ER c/o continual bleeding since having two lower teeth pulled this morning at 1000.  Pt denies blood thinners.  Pt noted to have gauze in her mouth with bleeding controlled at this time.  Pt does have dried blood noted around her mouth.  Pt otherwise A&Ox4.  Upon taking gauze out, pt appears to have bleeding controlled.

## 2021-07-06 NOTE — ED Triage Notes (Signed)
Pt comes via EMS with c/o oral bleeding since 10am./ pt had two teeth pulled this am. Pt had bridge pulled. EMS reports  medium bleeding with them. Bleeding controlled a little.  VSS  No blood thinners.

## 2021-07-13 ENCOUNTER — Ambulatory Visit
Admission: RE | Admit: 2021-07-13 | Discharge: 2021-07-13 | Disposition: A | Discharge status: Home / Self Care | Payer: Medicare Other | Source: Ambulatory Visit | Attending: Neurosurgery | Admitting: Neurosurgery

## 2021-07-13 DIAGNOSIS — R2689 Other abnormalities of gait and mobility: Secondary | ICD-10-CM | POA: Insufficient documentation

## 2021-07-13 DIAGNOSIS — G20 Parkinson's disease: Secondary | ICD-10-CM | POA: Diagnosis present

## 2021-07-24 DIAGNOSIS — I2510 Atherosclerotic heart disease of native coronary artery without angina pectoris: Secondary | ICD-10-CM | POA: Insufficient documentation

## 2021-07-24 DIAGNOSIS — E785 Hyperlipidemia, unspecified: Secondary | ICD-10-CM | POA: Insufficient documentation

## 2021-08-07 ENCOUNTER — Ambulatory Visit

## 2021-08-09 ENCOUNTER — Ambulatory Visit

## 2021-10-10 DIAGNOSIS — G8929 Other chronic pain: Secondary | ICD-10-CM | POA: Insufficient documentation

## 2021-12-03 IMAGING — CT CT HEAD W/O CM
3 series · 15 of 46 positions shown, 18 images · non-contrast
Comparison: Brain MRI 10/01/2018.

CLINICAL DATA: Mental status change, unknown cause. Right foot
weakness. Additional history provided: Patient reports fall and
right foot weakness

EXAM:
CT HEAD WITHOUT CONTRAST
TECHNIQUE: Contiguous axial images were obtained from the base of the skull
through the vertex without intravenous contrast.

[Series 2: head wo · axial · 0.39mm/px · z∈[+1037,+1157]mm · 9 of 29 slices shown, 12 images]
[im 3/29  brain]
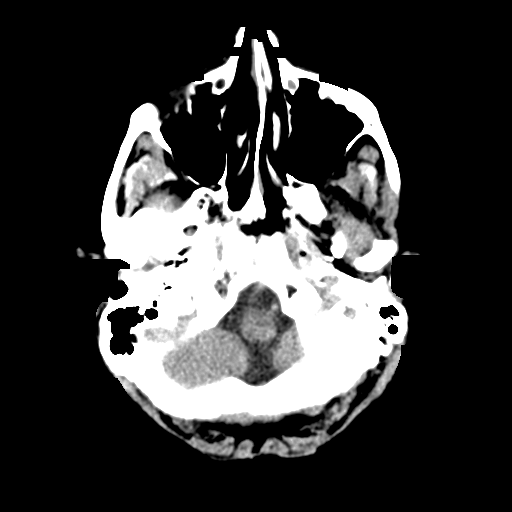
[im 3/29  bone]
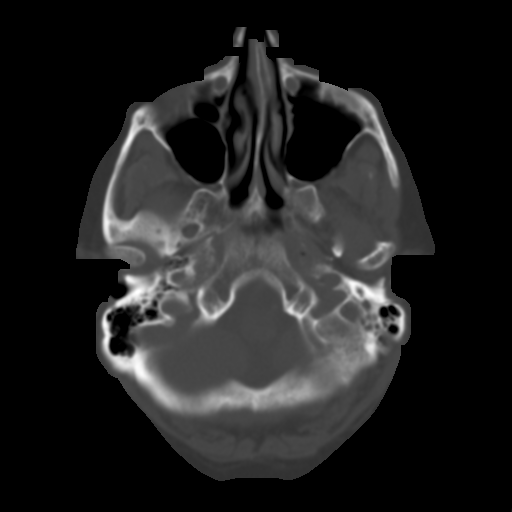
[im 6/29  brain]
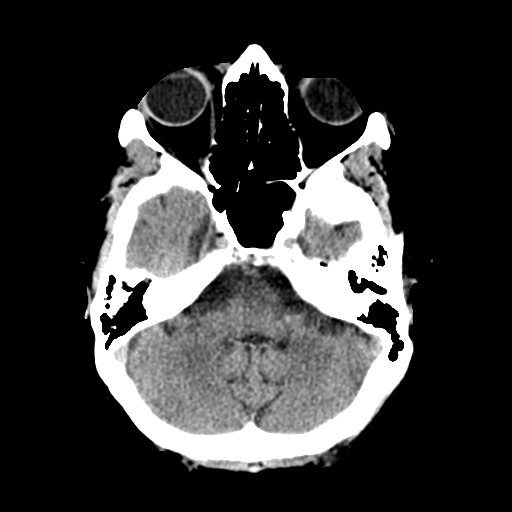
[im 9/29  brain]
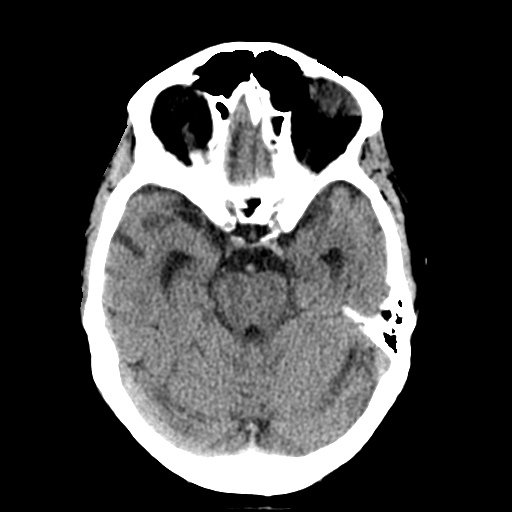
[im 12/29  brain]
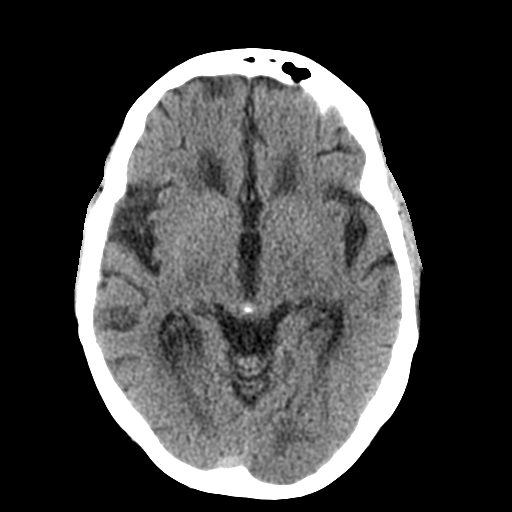
[im 15/29  brain]
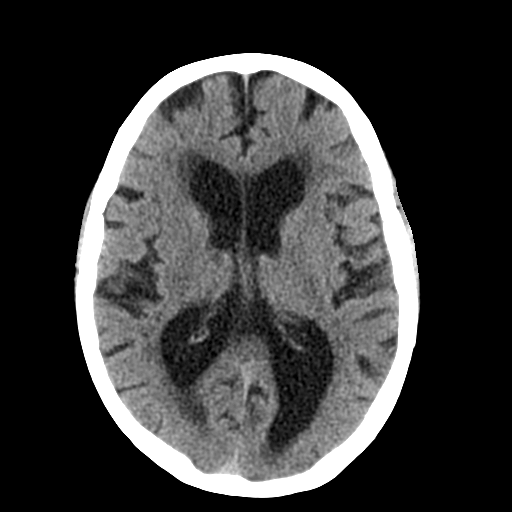
[im 15/29  bone]
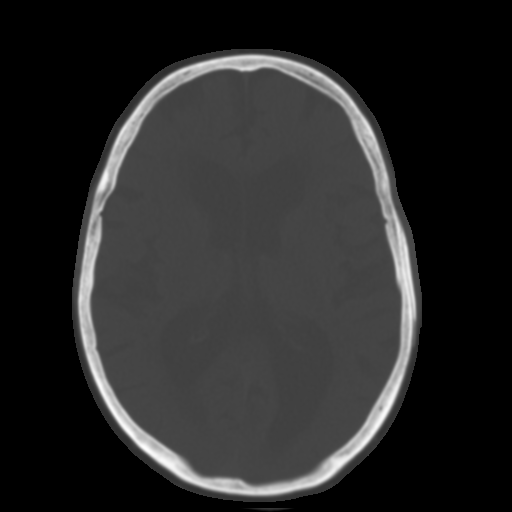
[im 18/29  brain]
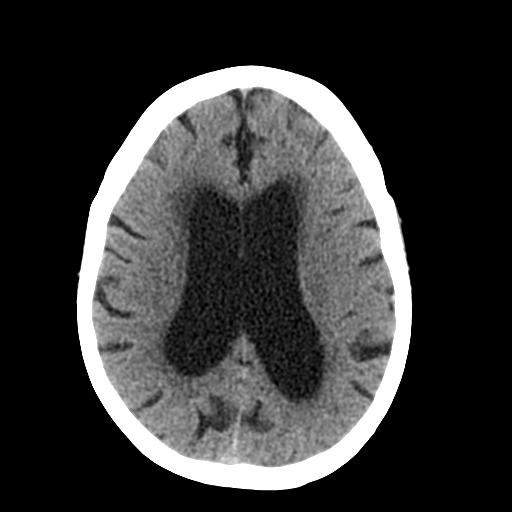
[im 21/29  brain]
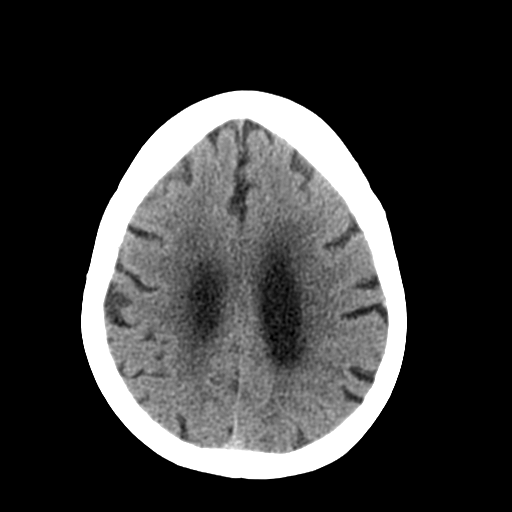
[im 24/29  brain]
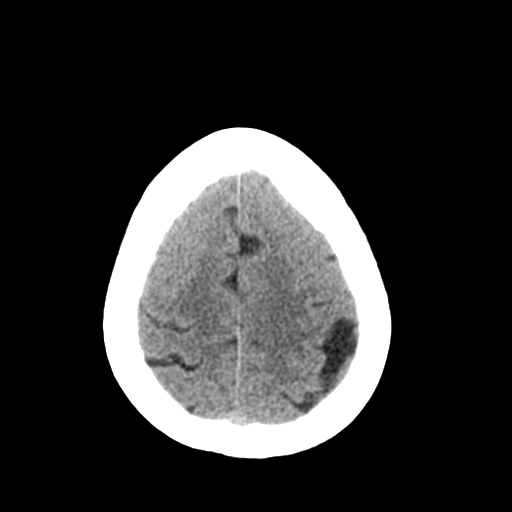
[im 27/29  brain]
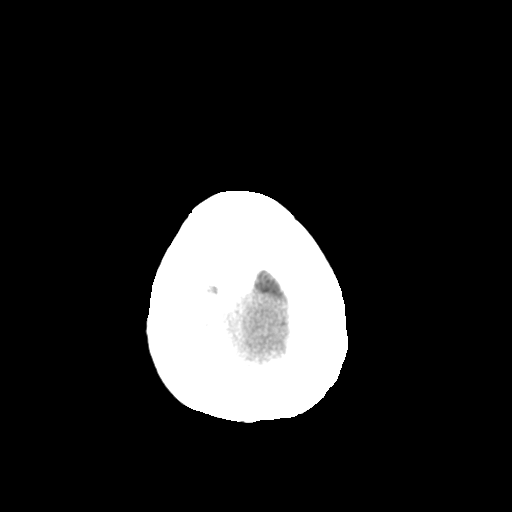
[im 27/29  bone]
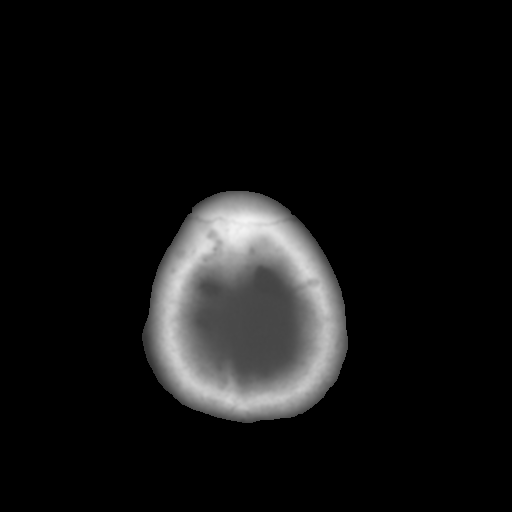

[Series 4: coronal soft tissue · coronal · 0.29mm/px · 3 of 65 slices shown]
[im 22/65  brain]
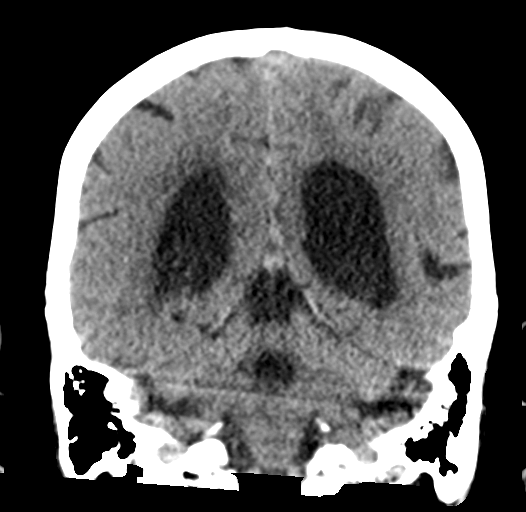
[im 29/65  brain]
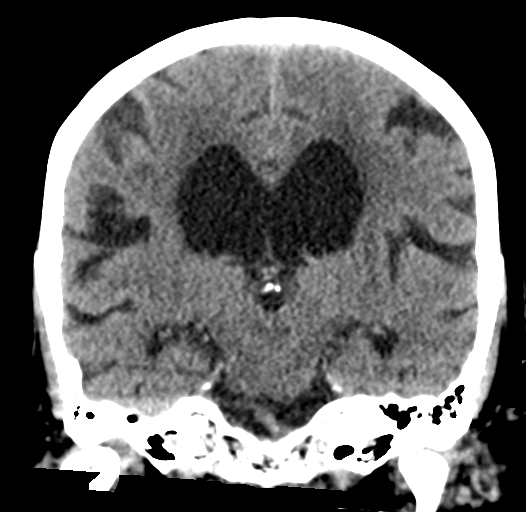
[im 36/65  brain]
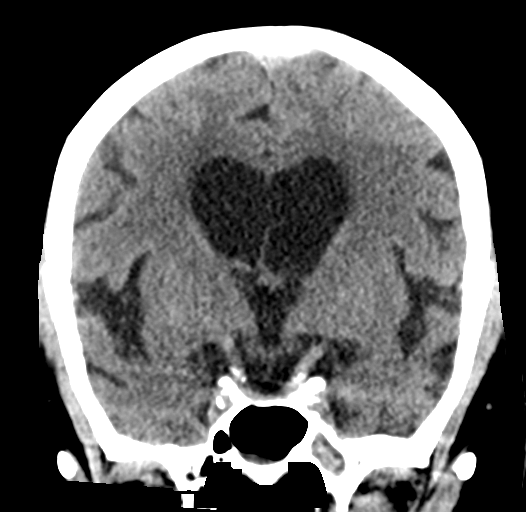

[Series 5: sagittal soft tissue · sagittal · 0.29mm/px · 3 of 55 slices shown]
[im 19/55  brain]
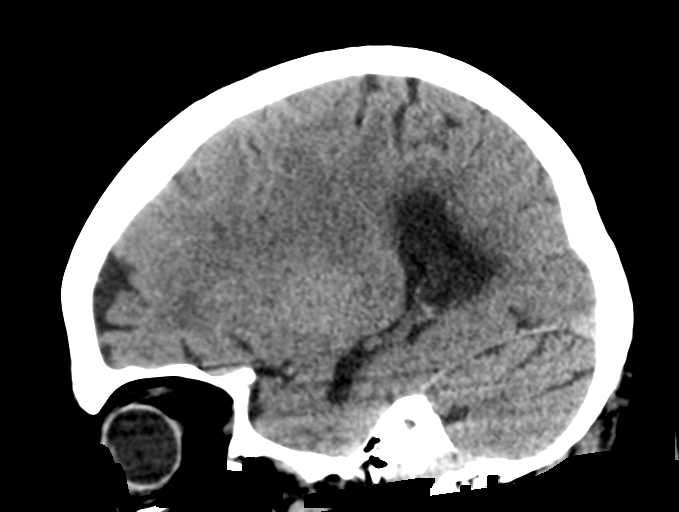
[im 28/55  brain]
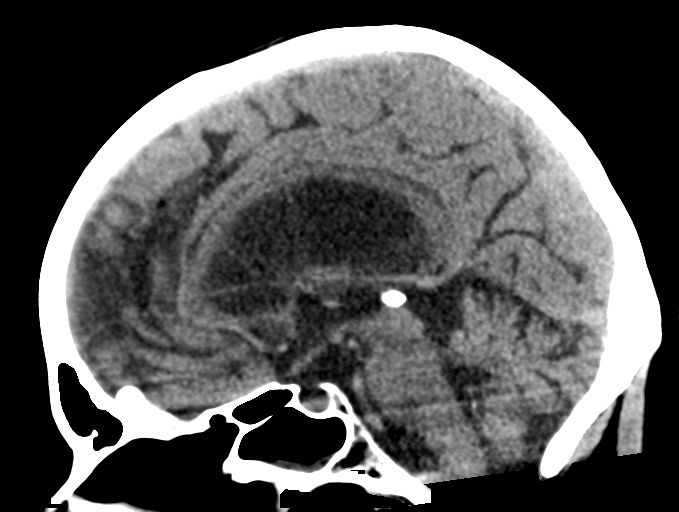
[im 37/55  brain]
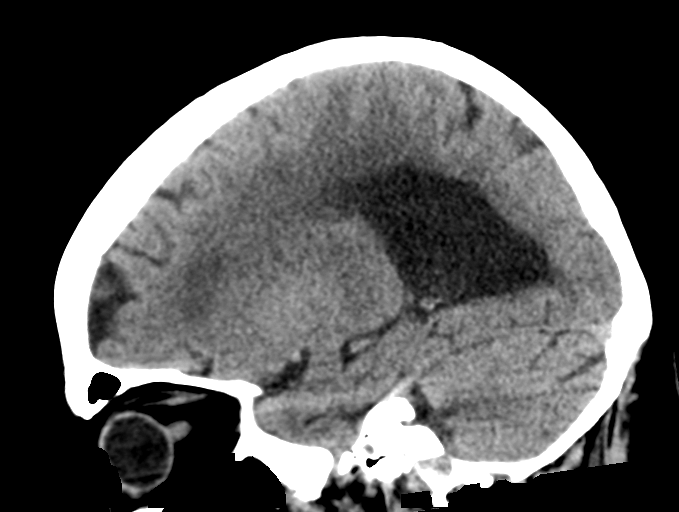

[15 of 46 positions shown; findings below may reference images not displayed]

FINDINGS: Brain:

Mild-to-moderate cerebral atrophy. Comparatively mild cerebellar
atrophy.

Unchanged from the prior MRI of 10/01/2018, there is mild lateral,
and to a lesser degree third, ventriculomegaly with up lifting of
the corpus callosum.

Mild ill-defined hypoattenuation within the cerebral white matter is
nonspecific, but compatible with chronic small vessel ischemic
disease.

There is no acute intracranial hemorrhage.

No demarcated cortical infarct.

No extra-axial fluid collection.

No evidence of intracranial mass.

No midline shift.

Vascular: No hyperdense vessel.  Atherosclerotic calcifications.

Skull: No calvarial fracture or focal suspicious osseous lesion.

Sinuses/Orbits: Visualized orbits show no acute finding. Moderate
left sphenoid sinus mucosal thickening. No more than mild scattered
paranasal sinus mucosal thickening elsewhere at the imaged levels.
IMPRESSION: No evidence of acute intracranial hemorrhage or acute infarct.

No acute posttraumatic intracranial findings.

Unchanged from the prior MRI of 10/01/2018, there is lateral and
third ventriculomegaly from communicating hydrocephalus and/or
central predominant atrophy.

Stable mild-to-moderate cerebral atrophy and mild chronic small
vessel ischemic disease.

Paranasal sinus disease as described, most notably moderate left
sphenoid mucosal thickening.

## 2021-12-03 IMAGING — CT CT CHEST-ABD-PELV W/ CM
5 of 6 series · 9 of 33 positions shown, 11 images · IV contrast (omnipaque)
Comparison: None.

CLINICAL DATA: Right hip and right abdomen pain following a fall,
hitting a bedside table.

EXAM:
CT CHEST, ABDOMEN, AND PELVIS WITH CONTRAST
TECHNIQUE: Multidetector CT imaging of the chest, abdomen and pelvis was
performed following the standard protocol during bolus
administration of intravenous contrast.
CONTRAST:  100mL OMNIPAQUE IOHEXOL 300 MG/ML  SOLN

[Series 2: cap with · axial · 0.82mm/px · z∈[-232,-27]mm · 2 of 125 slices shown, 3 images]
[im 42/125  soft-tissue]
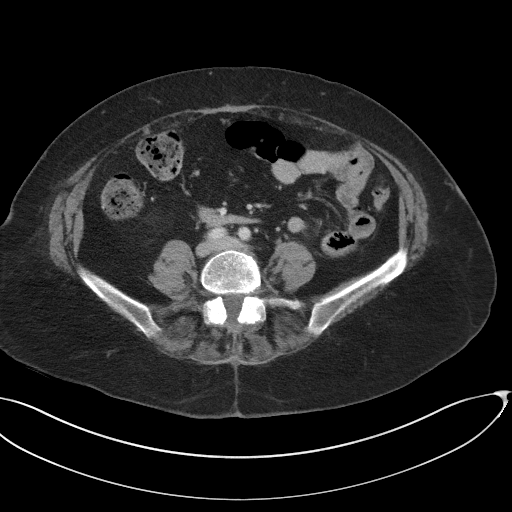
[im 42/125  bone]
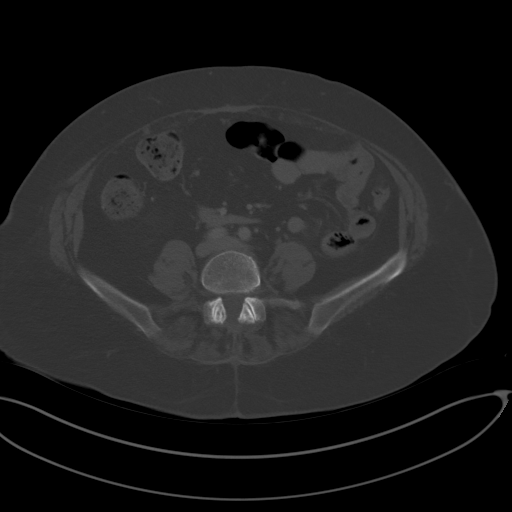
[im 83/125  soft-tissue]
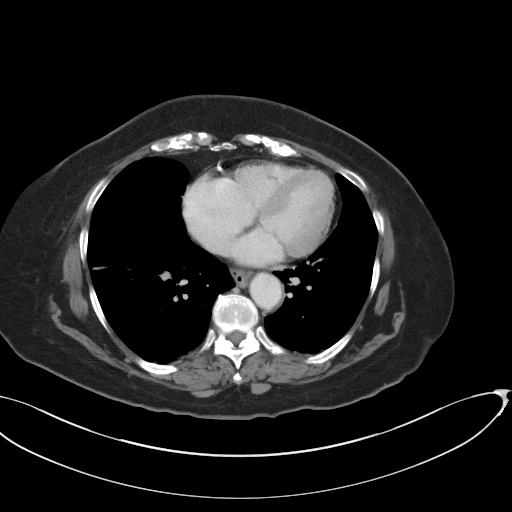

[Series 4: lung · axial · 0.73mm/px · z∈[+27,+105]mm · 2 of 117 slices shown]
[im 39/117  bone]
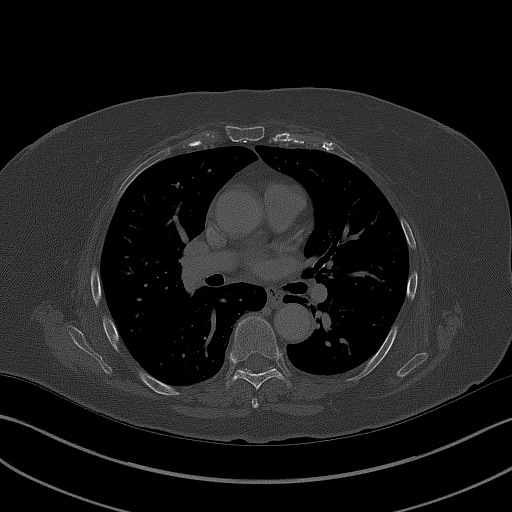
[im 78/117  bone]
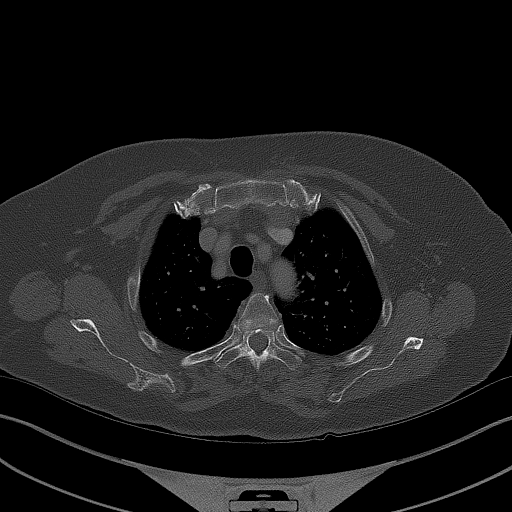

[Series 5: coronals · coronal · 0.84mm/px · 1 of 149 slices shown]
[im 60/149  bone]
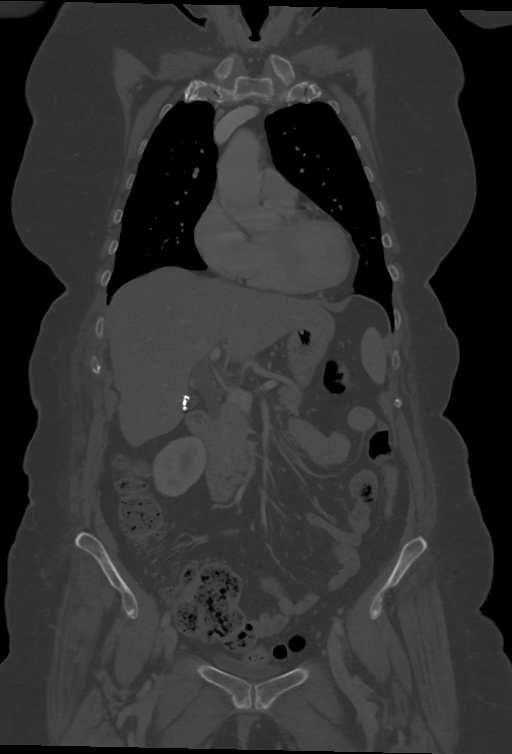

[Series 6: sagittal · sagittal · 0.58mm/px · 1 of 212 slices shown, 2 images]
[im 106/212  soft-tissue]
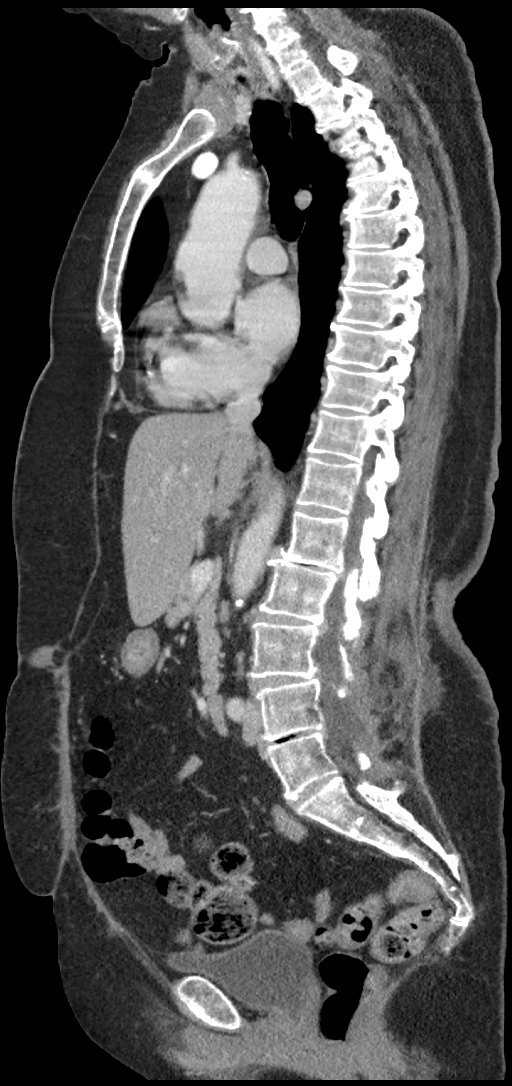
[im 106/212  bone]
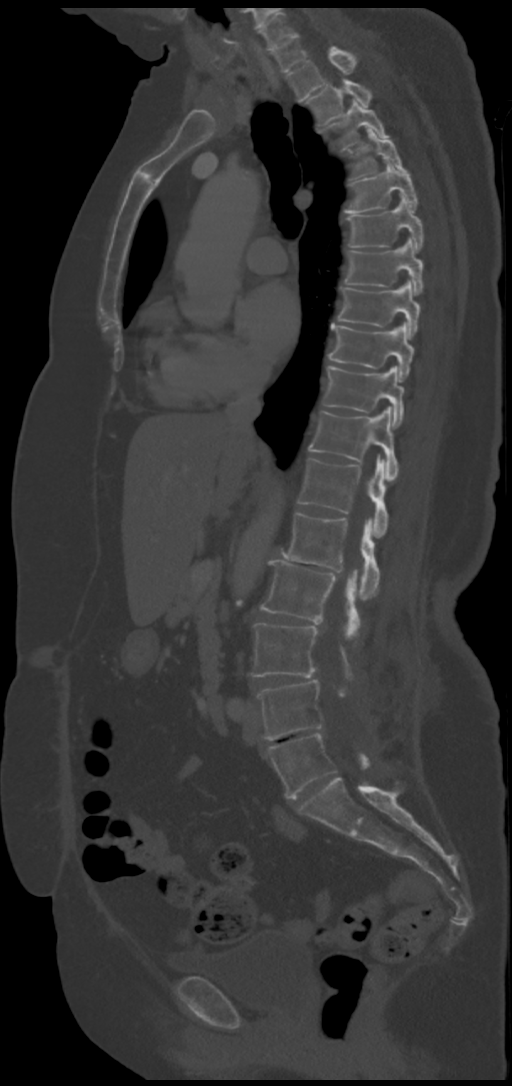

[Series 8: (id) · axial · 0.73mm/px · z∈[-59,+101]mm · 3 of 162 slices shown]
[im 41/162  soft-tissue]
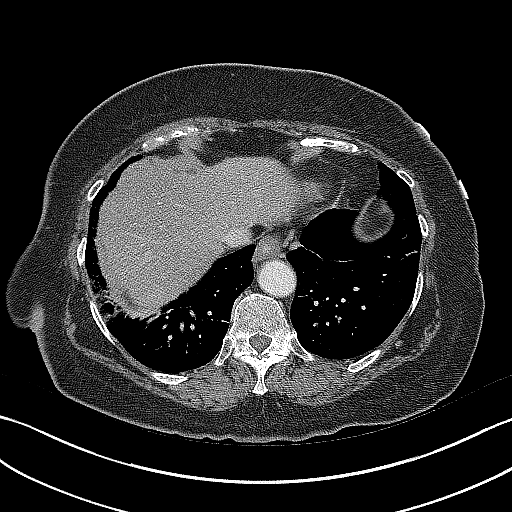
[im 81/162  soft-tissue]
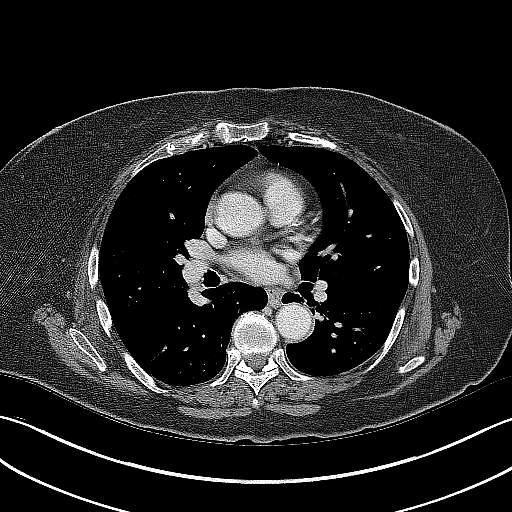
[im 121/162  soft-tissue]
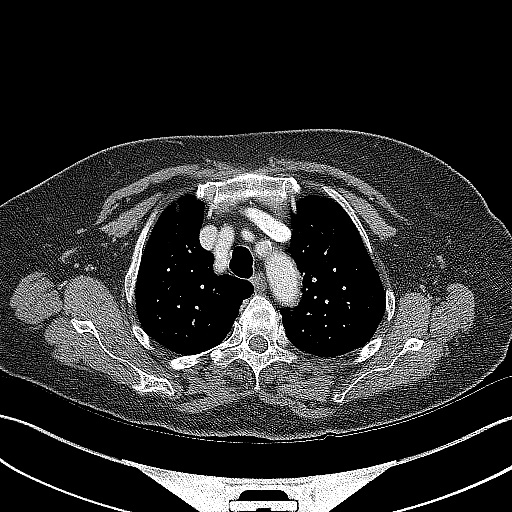

[9 of 33 positions shown; findings below may reference images not displayed]

FINDINGS: CT CHEST FINDINGS

Cardiovascular: Atheromatous calcifications, including the coronary
arteries and aorta. Normal sized heart.

Mediastinum/Nodes: 9 mm densely calcified left lobe thyroid nodule.
Not clinically significant; no follow-up imaging recommended (ref: [HOSPITAL]. [DATE]): 143-50).No enlarged lymph nodes.
Unremarkable esophagus.

Lungs/Pleura: Linear atelectasis and scarring in both lung bases. No
pneumothorax or pleural fluid. The previously noted 5-6 mm chest
which each vague, patchy nodular density in the right lower lobe on
image number 117 series 8, has not changed since 12/13/2015. Again,
this does not need follow-up.

Musculoskeletal: Thoracic and lower cervical spine degenerative
changes. No acute fractures or subluxations visualized.

CT ABDOMEN PELVIS FINDINGS

Hepatobiliary: Mild diffuse low density of the liver relative to the
spleen. Cholecystectomy clips.

Pancreas: Unremarkable. No pancreatic ductal dilatation or
surrounding inflammatory changes.

Spleen: Normal in size without focal abnormality.

Adrenals/Urinary Tract: Adrenal glands are unremarkable. Kidneys are
normal, without renal calculi, focal lesion, or hydronephrosis.
Bladder is unremarkable.

Stomach/Bowel: Unremarkable stomach, small bowel and colon. A small
bowel anastomosis in the left lower abdomen is again demonstrated as
well as multiple abdominal surgical clips. Surgically absent
appendix.

Vascular/Lymphatic: Atheromatous arterial calcifications without
aneurysm. No enlarged lymph nodes.

Reproductive: Status post hysterectomy. No adnexal masses.

Other: No abdominal wall hernia or abnormality. No abdominopelvic
ascites.

Musculoskeletal: Lumbar spine laminectomy defects. No fracture,
dislocation or acute subluxation.
IMPRESSION: 1. No acute abnormality in the chest, abdomen or pelvis.
2. Mild diffuse hepatic steatosis.
3. Calcific coronary artery and aortic atherosclerosis.

Aortic Atherosclerosis ([id number].T).

## 2022-05-05 ENCOUNTER — Emergency Department
Admission: EM | Admit: 2022-05-05 | Discharge: 2022-05-05 | Disposition: A | Discharge status: Skilled Nursing Facility | Payer: Medicare Other | Attending: Emergency Medicine | Admitting: Emergency Medicine

## 2022-05-05 ENCOUNTER — Emergency Department: Payer: Medicare Other

## 2022-05-05 ENCOUNTER — Other Ambulatory Visit: Payer: Self-pay

## 2022-05-05 DIAGNOSIS — S8011XA Contusion of right lower leg, initial encounter: Secondary | ICD-10-CM | POA: Diagnosis not present

## 2022-05-05 DIAGNOSIS — I509 Heart failure, unspecified: Secondary | ICD-10-CM | POA: Insufficient documentation

## 2022-05-05 DIAGNOSIS — I110 Hypertensive heart disease with heart failure: Secondary | ICD-10-CM | POA: Insufficient documentation

## 2022-05-05 DIAGNOSIS — I700 Atherosclerosis of aorta: Secondary | ICD-10-CM | POA: Insufficient documentation

## 2022-05-05 DIAGNOSIS — W06XXXA Fall from bed, initial encounter: Secondary | ICD-10-CM | POA: Insufficient documentation

## 2022-05-05 DIAGNOSIS — E039 Hypothyroidism, unspecified: Secondary | ICD-10-CM | POA: Diagnosis not present

## 2022-05-05 DIAGNOSIS — S0990XA Unspecified injury of head, initial encounter: Secondary | ICD-10-CM | POA: Diagnosis not present

## 2022-05-05 DIAGNOSIS — S8991XA Unspecified injury of right lower leg, initial encounter: Secondary | ICD-10-CM | POA: Diagnosis present

## 2022-05-05 LAB — BASIC METABOLIC PANEL
Anion gap: 12 (ref 5–15)
BUN: 17 mg/dL (ref 8–23)
CO2: 25 mmol/L (ref 22–32)
Calcium: 9.2 mg/dL (ref 8.9–10.3)
Chloride: 104 mmol/L (ref 98–111)
Creatinine, Ser: 0.66 mg/dL (ref 0.44–1.00)
GFR, Estimated: >60 mL/min (ref >60)
Glucose, Bld: 108 mg/dL — ABNORMAL HIGH (ref 70–99)
Potassium: 3.8 mmol/L (ref 3.5–5.1)
Sodium: 141 mmol/L (ref 135–145)

## 2022-05-05 LAB — CBC WITH DIFFERENTIAL/PLATELET
Abs Immature Granulocytes: 0.03 10*3/uL (ref 0.00–0.07)
Basophils Absolute: 0 10*3/uL (ref 0.0–0.1)
Basophils Relative: 1 %
Eosinophils Absolute: 0.2 10*3/uL (ref 0.0–0.5)
Eosinophils Relative: 3 %
HCT: 46.3 % — ABNORMAL HIGH (ref 36.0–46.0)
Hemoglobin: 14.7 g/dL (ref 12.0–15.0)
Immature Granulocytes: 0 %
Lymphocytes Relative: 18 %
Lymphs Abs: 1.4 10*3/uL (ref 0.7–4.0)
MCH: 31.2 pg (ref 26.0–34.0)
MCHC: 31.7 g/dL (ref 30.0–36.0)
MCV: 98.3 fL (ref 80.0–100.0)
Monocytes Absolute: 0.9 10*3/uL (ref 0.1–1.0)
Monocytes Relative: 11 %
Neutro Abs: 5.1 10*3/uL (ref 1.7–7.7)
Neutrophils Relative %: 67 %
Platelets: 238 10*3/uL (ref 150–400)
RBC: 4.71 MIL/uL (ref 3.87–5.11)
RDW: 14.4 % (ref 11.5–15.5)
WBC: 7.5 10*3/uL (ref 4.0–10.5)
nRBC: 0 % (ref 0.0–0.2)

## 2022-05-05 NOTE — ED Notes (Signed)
Healing wound on back from melanoma removal 10 days ago

## 2022-05-05 NOTE — ED Provider Notes (Signed)
[facility] Provider Note    Event Date/Time   First MD Initiated Contact with Patient 05/05/22 [phone]     (approximate)   History   Fall   HPI  KENIDEE CREGAN is a 86 y.o. female with a history of hypertension, CHF, hypothyroidism, osteoporosis, and low back pain who presents with right hip pain after a mechanical slip and fall out of her bed onto the floor.  She is not sure if she hit her head.  She does complain of some low back pain as well.  She states that the pain is now improved after getting fentanyl from EMS.    Physical Exam   Triage Vital Signs: ED Triage Vitals  Enc Vitals Group     BP 05/05/22 0739 (!) 147/57     Pulse Rate 05/05/22 0739 63     Resp 05/05/22 0739 11     Temp 05/05/22 0739 97.7 F (36.5 C)     Temp src --      SpO2 05/05/22 0739 95 %     Weight --      Height --      Head Circumference --      Peak Flow --      Pain Score 05/05/22 0743 4     Pain Loc --      Pain Edu? --      Excl. in [place]? --     Most recent vital signs: Vitals:   05/05/22 0739  BP: (!) 147/57  Pulse: 63  Resp: 11  Temp: 97.7 F (36.5 C)  SpO2: 95%     General: Awake, no distress.  CV:  Good peripheral perfusion.  Resp:  Normal effort.  Abd:  No distention.  Other:  Mild lumbar midline spinal tenderness with no step-off or crepitus.  No cervical spinal tenderness.  Motor intact in all extremities.  Pain on range of motion of right hip.  Possible mild shortening of right leg.  No tenderness to right knee.  2+ DP pulse.  Normal cap refill distally.  Motor and sensory intact distally.   ED Results / Procedures / Treatments   Labs (all labs ordered are listed, but only abnormal results are displayed) Labs Reviewed  BASIC METABOLIC PANEL - Abnormal; Notable for the following components:      Result Value   Glucose, Bld 108 (*)    All other components within normal limits  CBC WITH DIFFERENTIAL/PLATELET - Abnormal; Notable for the  following components:   HCT 46.3 (*)    All other components within normal limits     EKG  ED ECG REPORT I, Arta Silence, the attending physician, personally viewed and interpreted this ECG.  Date: 05/05/2022 EKG Time: 0740 Rate: 63 Rhythm: normal sinus rhythm QRS Axis: normal Intervals: normal ST/T Wave abnormalities: normal Narrative Interpretation: no evidence of acute ischemia    RADIOLOGY  XR R hip: I independently viewed and interpreted the images; there is no evidence of acute fracture  XR R tibia/fibula: No acute fracture  CT head: No ICH or other acute abnormality CT cervical spine: No acute fracture CT lumbar spine: Stable chronic L4 compression fracture with no acute findings  PROCEDURES:  Critical Care performed: No  Procedures   MEDICATIONS ORDERED IN ED: Medications - No data to display   IMPRESSION / MDM / [place] / ED COURSE  I reviewed the triage vital signs and the nursing notes.   Differential diagnosis includes, but  is not limited to, hip fracture, contusion, minor head injury, lumbar spine injury.  Patient's presentation is most consistent with acute presentation with potential threat to life or bodily function.  We will obtain basic labs, CT head, cervical spine, and lumbar spine, and x-rays of the right hip.  The patient has no thoracic spine tenderness.  All extremities are neuro/vascular intact.  The patient is on the cardiac monitor to evaluate for evidence of arrhythmia and/or significant heart rate changes.   ----------------------------------------- 10:23 AM on 05/05/2022 -----------------------------------------  X-ray of the right hip shows no acute fracture.  CT head and cervical spine are negative.  CT L-spine shows a chronic L4 fracture with no acute findings.  Basic labs are unremarkable.  Electrolytes are normal.  There is no leukocytosis or anemia.  The patient then reported that she is actually  having pain to the right shin area as well.  She has mild tenderness there but no deformity.  X-rays were obtained and are also negative.  The patient has difficulty walking at baseline and uses a walker.  However, she is able to stand and bear weight on both legs without difficulty.  At this time, there is no evidence of concerning acute injury and she is stable for discharge home.  I counseled her on the results of the work-up.  Return precautions given, and she expresses understanding.   FINAL CLINICAL IMPRESSION(S) / ED DIAGNOSES   Final diagnoses:  Contusion of right lower extremity, initial encounter     Rx / DC Orders   ED Discharge Orders     None        Note:  This document was prepared using Dragon voice recognition software and may include unintentional dictation errors.    Arta Silence, MD 05/05/22 1024

## 2022-05-05 NOTE — ED Notes (Signed)
Home place called to update on pt condition and transport back.

## 2022-05-05 NOTE — ED Triage Notes (Signed)
Pt fell out of bed this morning around 0600. Denies hitting head. Pt was on the floor for approx 1 hr. Complains of right leg pain. Right leg noted to be about 1inch shorter than left. EMS gave 53mg of Fent. Pt resting comfortably at this time.

## 2022-05-05 NOTE — Discharge Instructions (Signed)
Take Tylenol as needed for pain.  Make sure to stay hydrated to prevent cramps.  Return to the ER for new, worsening, or persistent severe pain, inability bear weight on the leg, weakness or numbness, swelling, or any other new or worsening symptoms that concern you.

## 2022-05-05 NOTE — ED Notes (Signed)
Pt able to stand and bare weight on right leg with assistive device.

## 2022-05-14 ENCOUNTER — Emergency Department: Payer: Medicare Other

## 2022-05-14 ENCOUNTER — Observation Stay
Admission: EM | Admit: 2022-05-14 | Discharge: 2022-05-15 | Disposition: A | Discharge status: Skilled Nursing Facility | Payer: Medicare Other | Attending: Internal Medicine | Admitting: Internal Medicine

## 2022-05-14 ENCOUNTER — Observation Stay
Admit: 2022-05-14 | Discharge: 2022-05-14 | Disposition: A | Discharge status: Home / Self Care | Payer: Medicare Other | Attending: Internal Medicine | Admitting: Internal Medicine

## 2022-05-14 ENCOUNTER — Other Ambulatory Visit: Payer: Self-pay

## 2022-05-14 DIAGNOSIS — Z79899 Other long term (current) drug therapy: Secondary | ICD-10-CM | POA: Insufficient documentation

## 2022-05-14 DIAGNOSIS — G20 Parkinson's disease: Secondary | ICD-10-CM | POA: Diagnosis not present

## 2022-05-14 DIAGNOSIS — R9431 Abnormal electrocardiogram [ECG] [EKG]: Secondary | ICD-10-CM | POA: Diagnosis not present

## 2022-05-14 DIAGNOSIS — E039 Hypothyroidism, unspecified: Secondary | ICD-10-CM | POA: Diagnosis present

## 2022-05-14 DIAGNOSIS — R079 Chest pain, unspecified: Secondary | ICD-10-CM | POA: Diagnosis present

## 2022-05-14 DIAGNOSIS — I4892 Unspecified atrial flutter: Secondary | ICD-10-CM | POA: Diagnosis not present

## 2022-05-14 DIAGNOSIS — Z20822 Contact with and (suspected) exposure to covid-19: Secondary | ICD-10-CM | POA: Diagnosis not present

## 2022-05-14 DIAGNOSIS — I110 Hypertensive heart disease with heart failure: Secondary | ICD-10-CM | POA: Diagnosis not present

## 2022-05-14 DIAGNOSIS — Z87891 Personal history of nicotine dependence: Secondary | ICD-10-CM | POA: Insufficient documentation

## 2022-05-14 DIAGNOSIS — Z8543 Personal history of malignant neoplasm of ovary: Secondary | ICD-10-CM | POA: Diagnosis not present

## 2022-05-14 DIAGNOSIS — I10 Essential (primary) hypertension: Secondary | ICD-10-CM | POA: Diagnosis present

## 2022-05-14 DIAGNOSIS — Z7982 Long term (current) use of aspirin: Secondary | ICD-10-CM | POA: Insufficient documentation

## 2022-05-14 DIAGNOSIS — I5032 Chronic diastolic (congestive) heart failure: Secondary | ICD-10-CM | POA: Diagnosis present

## 2022-05-14 DIAGNOSIS — R0789 Other chest pain: Secondary | ICD-10-CM | POA: Diagnosis present

## 2022-05-14 DIAGNOSIS — G20C Parkinsonism, unspecified: Secondary | ICD-10-CM | POA: Diagnosis present

## 2022-05-14 LAB — COMPREHENSIVE METABOLIC PANEL
ALT: 24 U/L (ref 0–44)
AST: 22 U/L (ref 15–41)
Albumin: 3.9 g/dL (ref 3.5–5.0)
Alkaline Phosphatase: 53 U/L (ref 38–126)
Anion gap: 10 (ref 5–15)
BUN: 14 mg/dL (ref 8–23)
CO2: 25 mmol/L (ref 22–32)
Calcium: 9.3 mg/dL (ref 8.9–10.3)
Chloride: 106 mmol/L (ref 98–111)
Creatinine, Ser: 0.6 mg/dL (ref 0.44–1.00)
GFR, Estimated: >60 mL/min (ref >60)
Glucose, Bld: 104 mg/dL — ABNORMAL HIGH (ref 70–99)
Potassium: 4.4 mmol/L (ref 3.5–5.1)
Sodium: 141 mmol/L (ref 135–145)
Total Bilirubin: 1.1 mg/dL (ref 0.3–1.2)
Total Protein: 6.8 g/dL (ref 6.5–8.1)

## 2022-05-14 LAB — CBC WITH DIFFERENTIAL/PLATELET
Abs Immature Granulocytes: 0.06 10*3/uL (ref 0.00–0.07)
Basophils Absolute: 0.1 10*3/uL (ref 0.0–0.1)
Basophils Relative: 1 %
Eosinophils Absolute: 0.3 10*3/uL (ref 0.0–0.5)
Eosinophils Relative: 4 %
HCT: 46.9 % — ABNORMAL HIGH (ref 36.0–46.0)
Hemoglobin: 14.9 g/dL (ref 12.0–15.0)
Immature Granulocytes: 1 %
Lymphocytes Relative: 17 %
Lymphs Abs: 1.2 10*3/uL (ref 0.7–4.0)
MCH: 30.9 pg (ref 26.0–34.0)
MCHC: 31.8 g/dL (ref 30.0–36.0)
MCV: 97.3 fL (ref 80.0–100.0)
Monocytes Absolute: 0.8 10*3/uL (ref 0.1–1.0)
Monocytes Relative: 10 %
Neutro Abs: 5.1 10*3/uL (ref 1.7–7.7)
Neutrophils Relative %: 67 %
Platelets: 220 10*3/uL (ref 150–400)
RBC: 4.82 MIL/uL (ref 3.87–5.11)
RDW: 14.2 % (ref 11.5–15.5)
WBC: 7.5 10*3/uL (ref 4.0–10.5)
nRBC: 0 % (ref 0.0–0.2)

## 2022-05-14 LAB — TROPONIN I (HIGH SENSITIVITY)
Troponin I (High Sensitivity): 5 ng/L (ref <18)
Troponin I (High Sensitivity): 5 ng/L (ref <18)

## 2022-05-14 LAB — BRAIN NATRIURETIC PEPTIDE: B Natriuretic Peptide: 66.6 pg/mL (ref 0.0–100.0)

## 2022-05-14 LAB — MRSA NEXT GEN BY PCR, NASAL: MRSA by PCR Next Gen: NOT DETECTED

## 2022-05-14 LAB — SARS CORONAVIRUS 2 BY RT PCR: SARS Coronavirus 2 by RT PCR: NEGATIVE

## 2022-05-14 LAB — MAGNESIUM: Magnesium: 2.1 mg/dL (ref 1.7–2.4)

## 2022-05-14 LAB — TSH: TSH: 1.936 u[IU]/mL (ref 0.350–4.500)

## 2022-05-14 MED ORDER — TRAZODONE HCL 50 MG PO TABS
25.0000 mg | ORAL_TABLET | Freq: Every evening | ORAL | Status: DC | PRN
  Administered 2022-05-14: 25 mg via ORAL
  Filled 2022-05-14: qty 1

## 2022-05-14 MED ORDER — ONDANSETRON HCL 4 MG/2ML IJ SOLN: 4.0000 mg | Freq: Four times a day (QID) | INTRAMUSCULAR | Status: DC | PRN

## 2022-05-14 MED ORDER — LOSARTAN POTASSIUM 50 MG PO TABS
50.0000 mg | ORAL_TABLET | Freq: Every day | ORAL | Status: DC
  Administered 2022-05-15: 50 mg via ORAL
  Filled 2022-05-14: qty 1

## 2022-05-14 MED ORDER — POLYETHYLENE GLYCOL 3350 17 G PO PACK: 17.0000 g | PACK | Freq: Every day | ORAL | Status: DC | PRN

## 2022-05-14 MED ORDER — NITROGLYCERIN 0.4 MG SL SUBL: 0.4000 mg | SUBLINGUAL_TABLET | SUBLINGUAL | Status: DC | PRN

## 2022-05-14 MED ORDER — DULOXETINE HCL 30 MG PO CPEP
60.0000 mg | ORAL_CAPSULE | Freq: Every day | ORAL | Status: DC
  Administered 2022-05-14 – 2022-05-15 (×2): 60 mg via ORAL
  Filled 2022-05-14 (×2): qty 2

## 2022-05-14 MED ORDER — HYDRALAZINE HCL 20 MG/ML IJ SOLN
10.0000 mg | Freq: Four times a day (QID) | INTRAMUSCULAR | Status: DC | PRN
  Administered 2022-05-14: 10 mg via INTRAVENOUS
  Filled 2022-05-14: qty 1

## 2022-05-14 MED ORDER — ROPINIROLE HCL 0.25 MG PO TABS
0.2500 mg | ORAL_TABLET | Freq: Every day | ORAL | Status: DC
  Administered 2022-05-15: 0.25 mg via ORAL
  Filled 2022-05-14 (×2): qty 1

## 2022-05-14 MED ORDER — VITAMIN E 45 MG (100 UNIT) PO CAPS
1000.0000 [IU] | ORAL_CAPSULE | Freq: Every day | ORAL | Status: DC
  Administered 2022-05-14 – 2022-05-15 (×2): 1000 [IU] via ORAL
  Filled 2022-05-14 (×2): qty 10

## 2022-05-14 MED ORDER — FUROSEMIDE 40 MG PO TABS: 40.0000 mg | ORAL_TABLET | Freq: Every day | ORAL | Status: DC | PRN

## 2022-05-14 MED ORDER — LEVOTHYROXINE SODIUM 88 MCG PO TABS
88.0000 ug | ORAL_TABLET | Freq: Every day | ORAL | Status: DC
  Administered 2022-05-15: 88 ug via ORAL
  Filled 2022-05-14: qty 1

## 2022-05-14 MED ORDER — ACETAMINOPHEN 325 MG PO TABS
650.0000 mg | ORAL_TABLET | Freq: Four times a day (QID) | ORAL | Status: DC | PRN
  Administered 2022-05-14 – 2022-05-15 (×2): 650 mg via ORAL
  Filled 2022-05-14 (×2): qty 2

## 2022-05-14 MED ORDER — ENOXAPARIN SODIUM 60 MG/0.6ML IJ SOSY
0.5000 mg/kg | PREFILLED_SYRINGE | INTRAMUSCULAR | Status: DC
  Administered 2022-05-14: 50 mg via SUBCUTANEOUS
  Filled 2022-05-14: qty 0.6

## 2022-05-14 MED ORDER — CARVEDILOL PHOSPHATE ER 10 MG PO CP24
10.0000 mg | ORAL_CAPSULE | Freq: Every day | ORAL | Status: DC
  Administered 2022-05-15: 10 mg via ORAL
  Filled 2022-05-14 (×3): qty 1

## 2022-05-14 MED ORDER — GABAPENTIN 300 MG PO CAPS
600.0000 mg | ORAL_CAPSULE | Freq: Three times a day (TID) | ORAL | Status: DC
  Administered 2022-05-14 – 2022-05-15 (×4): 600 mg via ORAL
  Filled 2022-05-14 (×4): qty 2

## 2022-05-14 MED ORDER — ASPIRIN 81 MG PO CHEW
81.0000 mg | CHEWABLE_TABLET | Freq: Every day | ORAL | Status: DC
  Administered 2022-05-14 – 2022-05-15 (×2): 81 mg via ORAL
  Filled 2022-05-14 (×2): qty 1

## 2022-05-14 MED ORDER — ACETAMINOPHEN 650 MG RE SUPP: 650.0000 mg | Freq: Four times a day (QID) | RECTAL | Status: DC | PRN

## 2022-05-14 MED ORDER — ONDANSETRON HCL 4 MG PO TABS: 4.0000 mg | ORAL_TABLET | Freq: Four times a day (QID) | ORAL | Status: DC | PRN

## 2022-05-14 MED ORDER — LOSARTAN POTASSIUM 25 MG PO TABS: 25.0000 mg | ORAL_TABLET | Freq: Every day | ORAL | Status: DC

## 2022-05-14 MED ORDER — DOCUSATE SODIUM 100 MG PO CAPS
100.0000 mg | ORAL_CAPSULE | Freq: Two times a day (BID) | ORAL | Status: DC
  Administered 2022-05-14 – 2022-05-15 (×2): 100 mg via ORAL
  Filled 2022-05-14 (×2): qty 1

## 2022-05-14 MED ORDER — BACLOFEN 10 MG PO TABS
5.0000 mg | ORAL_TABLET | Freq: Two times a day (BID) | ORAL | Status: DC
  Administered 2022-05-14 – 2022-05-15 (×2): 5 mg via ORAL
  Filled 2022-05-14 (×3): qty 0.5

## 2022-05-14 NOTE — ED Notes (Signed)
ED Provider at bedside. 

## 2022-05-14 NOTE — ED Notes (Addendum)
Pt's clothing, linens and brief changed. Peri care provided. Purewick applied. Warm blankets applied per pt. Request. Pt. Verbalized comfort and gratitude, denies further need. Call light within reach. Daughter at bedside.

## 2022-05-14 NOTE — ED Provider Notes (Signed)
[facility] Provider Note    Event Date/Time   First MD Initiated Contact with Patient 05/14/22 0805     (approximate)   History   Chest Pain (Pt. To ED from [place] via EMS for left sided CP that radiates to left arm with nausea at 0400 that lasted x3.5hours. pt. Denies pain currently. Pt. Received '324mg'$  ASA this AM. Had one minute of VT, converted to A-flutter, then NSR en route.)   HPI  Susan Salas is a 86 y.o. female here with chest discomfort, nausea, and shortness of breath.  The patient states that starting at around 4 AM, she developed what she describes as an indigestion type feeling with associated chest discomfort.  She had some mild lightheadedness with this.  She states that earlier today, she also developed some lightheadedness.  EMS was called this morning.  With EMS, she reportedly had a less than 1 minute episode of V. tach in which she maintained her pulse and was alert.  She felt lightheaded during this.  She then returned to normal rhythm.  She denies any complaints currently.  She does have some cramping in her bilateral legs over the last several days.  No recent medication change.  No fevers or chills.  No history of coronary disease.  Denies known history of arrhythmia.     Physical Exam   Triage Vital Signs: ED Triage Vitals  Enc Vitals Group     BP 05/14/22 0805 (!) 177/88     Pulse Rate 05/14/22 0800 (!) 58     Resp 05/14/22 0800 14     Temp 05/14/22 0802 98 F (36.7 C)     Temp Source 05/14/22 0802 Oral     SpO2 05/14/22 0800 100 %     Weight 05/14/22 0804 217 lb 9.5 oz (98.7 kg)     Height 05/14/22 0804 '5\' 5"'$  (1.651 m)     Head Circumference --      Peak Flow --      Pain Score 05/14/22 0804 0     Pain Loc --      Pain Edu? --      Excl. in [place]? --     Most recent vital signs: Vitals:   05/14/22 1030 05/14/22 1115  BP: (!) 179/86   Pulse: 62 61  Resp: 16 14  Temp:    SpO2: 98% 94%      General: Awake, no distress.  CV:  Good peripheral perfusion.  Regular rate and rhythm. Resp:  Normal effort.  Lungs clear to auscultation bilaterally. Abd:  No distention.  No tenderness. Other:  Trace edema bilateral lower extremities.   ED Results / Procedures / Treatments   Labs (all labs ordered are listed, but only abnormal results are displayed) Labs Reviewed  CBC WITH DIFFERENTIAL/PLATELET - Abnormal; Notable for the following components:      Result Value   HCT 46.9 (*)    All other components within normal limits  COMPREHENSIVE METABOLIC PANEL - Abnormal; Notable for the following components:   Glucose, Bld 104 (*)    All other components within normal limits  SARS CORONAVIRUS 2 BY RT PCR  MAGNESIUM  BRAIN NATRIURETIC PEPTIDE  TROPONIN I (HIGH SENSITIVITY)  TROPONIN I (HIGH SENSITIVITY)     EKG Normal sinus rhythm, ventricular rate 63.  PR 171, QRS 89, QTc 398.  No acute ST elevations or depressions.  No EKG evidence of acute ischemia or infarct.   RADIOLOGY  Chest x-ray: Cardiomegaly, mild pulmonary vascular congestion   I also independently reviewed and agree with radiologist interpretations.   PROCEDURES:  Critical Care performed: No  .1-3 Lead EKG Interpretation  Performed by: Duffy Bruce, MD Authorized by: Duffy Bruce, MD     Interpretation: normal     ECG rate:  60-80   ECG rate assessment: normal     Rhythm: sinus rhythm     Ectopy: none     Conduction: normal   Comments:     Indication: Chest pain, reported arrhythmia   MEDICATIONS ORDERED IN ED: Medications  nitroGLYCERIN (NITROSTAT) SL tablet 0.4 mg (has no administration in time range)     IMPRESSION / MDM / ASSESSMENT AND PLAN / ED COURSE  I reviewed the triage vital signs and the nursing notes.                               The patient is on the cardiac monitor to evaluate for evidence of arrhythmia and/or significant heart rate changes.   Ddx:  Differential  includes the following, with pertinent life- or limb-threatening emergencies considered:  Arrhythmia, ACS, PE, GERD, esophagitis, atypical pneumonia or atelectasis, pneumothorax  Patient's presentation is most consistent with acute presentation with potential threat to life or bodily function.  MDM:  86 year old female with past medical history CHF, hypertension, hypothyroidism, mild dementia, here with chest pain waking her up and intermittent throughout the morning.  With EMS, patient was noted to be in V. tach per the report followed by a flutter which we do have a tracing of.  Reviewed the strip, and patient actually had a second episode of what appeared to be atrial flutter with variable 2-3 block briefly here in the ED.  White count normal.  Troponin normal x2.  CMP shows normal renal function and electrolytes.  Magnesium normal.  Troponin negative, doubt ischemic etiology.  Chest x-ray does show cardiomegaly with pulmonary vascular congestion.  Discussed with Dr. Saralyn Pilar.  Will admit for observation.  Patient is on Coreg.   MEDICATIONS GIVEN IN ED: Medications  nitroGLYCERIN (NITROSTAT) SL tablet 0.4 mg (has no administration in time range)     Consults:  Hospitalist   EMR reviewed  Previous cardiology notes, echocardiogram     FINAL CLINICAL IMPRESSION(S) / ED DIAGNOSES   Final diagnoses:  Atrial flutter, unspecified type ([place])  Atypical chest pain     Rx / DC Orders   ED Discharge Orders     None        Note:  This document was prepared using Dragon voice recognition software and may include unintentional dictation errors.   Duffy Bruce, MD 05/14/22 [phone]

## 2022-05-14 NOTE — ED Notes (Signed)
This RN to bedside for pt. C/o CP/epigastric pain. EKG obtained. MD notified. See MAR for orders. This RN contacted pharmacy to send nitro SL as ED is out.

## 2022-05-14 NOTE — Consult Note (Signed)
[facility] NOTE       Patient ID: Susan Salas MRN: [id number] DOB/AGE: 1936-05-22 86 y.o.  Admit date: 05/14/2022 Referring Physician Dr. Fritzi Mandes   Primary Physician Dr. Ramonita Lab Primary Cardiologist none Reason for Consultation ?atrial flutter  HPI: Susan Salas. [place] is an 54yoF with a PMH of ovarian cancer lumbar disc disease/spinal stenosis, hypertension, hypothyroidism, diverticulosis with h/o bowel perforation, osteoarthritis, Parkinson's disease who presented to [facility] ED from [place] on 05/14/22 with indigestion and chest discomfort. She was reportedly in VT for less than 1 minute per EMS (maintained a pulse and was alert) and possibly converted to atrial flutter. Cardiology is consulted for further assistance.   The patient presents with her daughter who contributes to the history.  Patient states she woke up this morning with pain in the center of her chest that "felt like someone was pressing on it" and she had some nausea.  She further describes this as "indigestion" and points to her epigastrium where her discomfort was the most.  She reported it at about an 8 out of 10 that lasted for several hours, but got better on its own without intervention while she was laying flat.  These think she had some discomfort in her left arm while she was having chest pain, but she cannot say for sure.  She does not remember ever having symptoms like this before.  She denies any associated shortness of breath, palpitations, heart racing, peripheral edema.  Her daughter's biggest concern is ongoing "hallucinations" and "vacant spells" that have been worsening over the past 3 weeks.  She gives an example of the patient mistaking her pulse oximeter, currently attached to her toe, as her dentures and trying to put that in her mouth.  Earlier when the nurse was assessing her and asking her where she was, the patient thought she was at the hospital at [facility].   She and her husband recently moved into an assisted living facility 2 weeks ago and her daughter thinks that her confusion is worsened since then.  Per EMS and the ED provider reporting, the patient was in NSVT for less than 1 minute on transport to the ED, then converted to atrial flutter, then to sinus rhythm.  Per review of telemetry and serial EKGs, I have seen no evidence of atrial flutter -it appears to be artifact from the patient's known parkinsonian tremor of her upper extremities.  On telemetry her heart rate has been consistently in the 60s without evidence of tachycardia or heart block seen thus far.  Her blood pressure has been rather elevated ranging between 159/86 and 181/83 since she has been in the ED.  SPO2 94% on room air.  Labs are notable for a potassium of 4.4, BUN/creatinine 14/0.6 with GFR greater than 60.  BNP negative at 66, high-sensitivity troponin negative at 5-5.  H&H stable at 14.9/46.9, platelets 220.  Chest x-ray is without overt airspace edema or pleural effusion with pulmonary vascular congestion without overt pulmonary edema.  Review of systems complete and found to be negative unless listed above     Past Medical History:  Diagnosis Date   Cancer ([place]) 2017   ovarian   CHF (congestive heart failure) (HCC)    Complication of anesthesia    hard to wake from general anesthesia, long porcedures nausea and vomiting.   Hypertension    Hypothyroidism    PONV (postoperative nausea and vomiting)    Spinal headache  with labor of 43 year old son.    Past Surgical History:  Procedure Laterality Date   ABDOMINAL HYSTERECTOMY     AMPUTATION TOE Left 05/02/2017   Procedure: AMPUTATION TOE/Left 4th [id number];  Surgeon: Sharlotte Alamo, DPM;  Location: [facility] ORS;  Service: Podiatry;  Laterality: Left;   APPENDECTOMY     BACK SURGERY  2014   lamenectomy   BREAST SURGERY Bilateral 2003   reduction   CATARACT EXTRACTION W/ INTRAOCULAR LENS IMPLANT Bilateral 2014    CHOLECYSTECTOMY     COLON SURGERY  2017   FRACTURE SURGERY Right 2003   screws & plates   KYPHOPLASTY N/A 02/26/2021   Procedure: L4 KYPHOPLASTY;  Surgeon: Hessie Knows, MD;  Location: [facility] ORS;  Service: Orthopedics;  Laterality: N/A;   TONSILLECTOMY      (Not in a hospital admission)  Social History   Socioeconomic History   Marital status: Married    Spouse name: Not on file   Number of children: Not on file   Years of education: Not on file   Highest education level: Not on file  Occupational History   Not on file  Tobacco Use   Smoking status: Former    Types: Cigarettes    Quit date: 04/29/1983    Years since quitting: 39.0   Smokeless tobacco: Never  Vaping Use   Vaping Use: Never used  Substance and Sexual Activity   Alcohol use: No   Drug use: No   Sexual activity: Not on file  Other Topics Concern   Not on file  Social History Narrative   Not on file   Social Determinants of Health   Financial Resource Strain: Not on file  Food Insecurity: Not on file  Transportation Needs: Not on file  Physical Activity: Not on file  Stress: Not on file  Social Connections: Not on file  Intimate Partner Violence: Not on file    No family history on file.    PHYSICAL EXAM General: Elderly Caucasian female, well nourished, in no acute distress.  Laying in incline in ED stretcher with daughter at bedside HEENT:  Normocephalic and atraumatic. Neck:  No JVD.  Lungs: Normal respiratory effort on room air.  Decreased breath sounds without appreciable crackles or wheezes.   Heart: HRRR . Normal S1 and S2 without gallops or murmurs.  Abdomen: Obese appearing.  Mild tenderness to palpation of the epigastrium. Msk: Normal strength and tone for age. Extremities: Warm and well perfused. No clubbing, cyanosis.  No peripheral edema.  Neuro: Alert and oriented to self, but disoriented to place.  Psych:  Answers simple questions but is a poor historian.   Labs: Basic Metabolic  Panel: Recent Labs    05/14/22 0833  NA 141  K 4.4  CL 106  CO2 25  GLUCOSE 104*  BUN 14  CREATININE 0.60  CALCIUM 9.3  MG 2.1   Liver Function Tests: Recent Labs    05/14/22 0833  AST 22  ALT 24  ALKPHOS 53  BILITOT 1.1  PROT 6.8  ALBUMIN 3.9   No results for input(s): "LIPASE", "AMYLASE" in the last 72 hours. CBC: Recent Labs    05/14/22 0833  WBC 7.5  NEUTROABS 5.1  HGB 14.9  HCT 46.9*  MCV 97.3  PLT 220   Cardiac Enzymes: Recent Labs    05/14/22 0833 05/14/22 1037  TROPONINIHS 5 5   BNP: Invalid input(s): "POCBNP" D-Dimer: No results for input(s): "DDIMER" in the last 72 hours. Hemoglobin A1C: No  results for input(s): "HGBA1C" in the last 72 hours. Fasting Lipid Panel: No results for input(s): "CHOL", "HDL", "LDLCALC", "TRIG", "CHOLHDL", "LDLDIRECT" in the last 72 hours. Thyroid Function Tests: No results for input(s): "TSH", "T4TOTAL", "T3FREE", "THYROIDAB" in the last 72 hours.  Invalid input(s): "FREET3" Anemia Panel: No results for input(s): "VITAMINB12", "FOLATE", "FERRITIN", "TIBC", "IRON", "RETICCTPCT" in the last 72 hours.  DG Chest Portable 1 View  Result Date: 05/14/2022 CLINICAL DATA:  Shortness of breath.  Chest pain. EXAM: PORTABLE CHEST 1 VIEW COMPARISON:  None Available. FINDINGS: 0834 hours. Lungs are hyperexpanded. The cardio pericardial silhouette is enlarged. There is pulmonary vascular congestion without overt pulmonary edema. Streaky opacity at the left base suggest atelectasis. No overt airspace edema or pleural effusion. Telemetry leads overlie the chest. IMPRESSION: Enlargement of the cardiopericardial silhouette with pulmonary vascular congestion. Electronically Signed   By: Misty Stanley M.D.   On: 05/14/2022 08:43     Radiology: DG Chest Portable 1 View  Result Date: 05/14/2022 CLINICAL DATA:  Shortness of breath.  Chest pain. EXAM: PORTABLE CHEST 1 VIEW COMPARISON:  None Available. FINDINGS: 0834 hours. Lungs are  hyperexpanded. The cardio pericardial silhouette is enlarged. There is pulmonary vascular congestion without overt pulmonary edema. Streaky opacity at the left base suggest atelectasis. No overt airspace edema or pleural effusion. Telemetry leads overlie the chest. IMPRESSION: Enlargement of the cardiopericardial silhouette with pulmonary vascular congestion. Electronically Signed   By: Misty Stanley M.D.   On: 05/14/2022 08:43   DG Tibia/Fibula Right  Result Date: 05/05/2022 CLINICAL DATA:  Lower leg pain after a fall. EXAM: RIGHT TIBIA AND FIBULA - 2 VIEW COMPARISON:  None Available. FINDINGS: There is no evidence of fracture or other focal bone lesions. Soft tissues are unremarkable. Degenerative spurring noted at the knee. IMPRESSION: Negative. Electronically Signed   By: Misty Stanley M.D.   On: 05/05/2022 10:09   DG Hip Unilat W or Wo Pelvis 2-3 Views Right  Result Date: 05/05/2022 CLINICAL DATA:  Right hip pain after fall. EXAM: DG HIP (WITH OR WITHOUT PELVIS) 2-3V RIGHT COMPARISON:  None Available. FINDINGS: There is no evidence of hip fracture or dislocation. Mild narrowing of the right hip joint is noted. IMPRESSION: Mild degenerative joint disease of right hip. No acute abnormality seen. Electronically Signed   By: Marijo Conception M.D.   On: 05/05/2022 09:05   CT Lumbar Spine Wo Contrast  Result Date: 05/05/2022 CLINICAL DATA:  86 year old female status post fall from wheelchair. EXAM: CT LUMBAR SPINE WITHOUT CONTRAST TECHNIQUE: Multidetector CT imaging of the lumbar spine was performed without intravenous contrast administration. Multiplanar CT image reconstructions were also generated. RADIATION DOSE REDUCTION: This exam was performed according to the departmental dose-optimization program which includes automated exposure control, adjustment of the mA and/or kV according to patient size and/or use of iterative reconstruction technique. COMPARISON:  Lumbar MRI 05/25/2021. FINDINGS:  Segmentation: Normal, the same numbering system used on the MRI last year. Alignment: Stable. Mild chronic straightening of lumbar lordosis, mild chronic retrolisthesis of L1 on L2. Vertebrae: Chronic, previously augmented moderate to severe L4 compression fracture. Chronic retropulsion of the posterosuperior endplate, and prior posterior decompression of that and the adjacent levels. Osteopenia. Stable visible lower thoracic and lumbar vertebral height elsewhere. Visible sacrum and SI joints appear intact. No acute osseous abnormality identified. Paraspinal and other soft tissues: Normal caliber abdominal aorta. Aortoiliac calcified atherosclerosis. Chronic postoperative changes to the lumbar paraspinal soft tissues. Disc levels: Superimposed lumbar spine degeneration appears stable since  last year with mild chronic multifactorial spinal stenosis at L1-L2. IMPRESSION: 1. No acute traumatic injury identified in the lumbar spine. 2. Stable chronic L4 compression fracture with retropulsion, previous augmentation, and with chronic posterior decompression of that and the adjacent levels. 3. Aortic Atherosclerosis (ICD10-I70.0). Electronically Signed   By: Genevie Ann M.D.   On: 05/05/2022 08:59   CT Cervical Spine Wo Contrast  Result Date: 05/05/2022 CLINICAL DATA:  86 year old female status post fall from wheelchair. Parkinson's disease. EXAM: CT CERVICAL SPINE WITHOUT CONTRAST TECHNIQUE: Multidetector CT imaging of the cervical spine was performed without intravenous contrast. Multiplanar CT image reconstructions were also generated. RADIATION DOSE REDUCTION: This exam was performed according to the departmental dose-optimization program which includes automated exposure control, adjustment of the mA and/or kV according to patient size and/or use of iterative reconstruction technique. COMPARISON:  09/25/2020. FINDINGS: Alignment: Stable straightening of cervical lordosis. Cervicothoracic junction alignment is within  normal limits. Bilateral posterior element alignment is within normal limits. Skull base and vertebrae: Visualized skull base is intact. No atlanto-occipital dissociation. C1 and C2 appear intact and aligned. No acute osseous abnormality identified. Soft tissues and spinal canal: No prevertebral fluid or swelling. No visible canal hematoma. Chronic postoperative changes to the posterior paraspinal soft tissues. Stable visible noncontrast neck soft tissues, aside from an increased retropharyngeal course of the right carotid since last year. Disc levels: Stable. Chronic posterior spinal decompression from C3 through C7. Upper chest: Stable. IMPRESSION: No acute traumatic injury identified, stable CT appearance of the cervical spine with prior posterior spinal decompression from C3 through C7. Electronically Signed   By: Genevie Ann M.D.   On: 05/05/2022 08:54   CT Head Wo Contrast  Result Date: 05/05/2022 CLINICAL DATA:  86 year old female status post fall from wheelchair. Parkinson's disease. EXAM: CT HEAD WITHOUT CONTRAST TECHNIQUE: Contiguous axial images were obtained from the base of the skull through the vertex without intravenous contrast. RADIATION DOSE REDUCTION: This exam was performed according to the departmental dose-optimization program which includes automated exposure control, adjustment of the mA and/or kV according to patient size and/or use of iterative reconstruction technique. COMPARISON:  Brain MRI 10/01/2018.  Head CT 09/25/2020. FINDINGS: Brain: Stable cerebral volume. Stable ventricle size and configuration. No midline shift, mass effect, or evidence of intracranial mass lesion. No acute intracranial hemorrhage identified. Stable gray-white matter differentiation throughout the brain. No cortically based acute infarct identified. Vascular: Calcified atherosclerosis at the skull base. No suspicious intracranial vascular hyperdensity. Skull: No acute osseous abnormality identified.  Sinuses/Orbits: Visualized paranasal sinuses and mastoids are clear. Other: No acute orbit or scalp soft tissue injury identified. IMPRESSION: No acute intracranial abnormality or acute traumatic injury identified. Electronically Signed   By: Genevie Ann M.D.   On: 05/05/2022 08:51    04/05/2015 Impression   Myocardial perfusion imaging is Normal.   Summed severity score is normal.   Overall left ventricular systolic function was Normal without regional  wall motion abnormalities (see above).   No prior studies for comparison   Interpretation By:  Scarlett Presto, MD   Electronically signed April 05, 2015 at 7:56 PM Narrative  Procedure: Pharmacologic Myocardial Perfusion Imaging    one day procedure   Indication: Pre-operative clearance   Ordering Physician: Dr. Caryl Comes  Clinical History:  86 y.o. year old female  Vitals:Height: 40 in Weight: 187 lb  Breast/Chest Size: moderate  Cardiac risk factors include:  hypertension,  hypercholesterolemia/hyperlipidemia.  CAD History: Normal SE 5/12   Procedure:  Pharmacologic stress  testing was performed with Regadenoson using a single  use 0.'4mg'$ /23m (0.08 mg/ml) prefilled syringe intravenously infused as a 10  second bolus dose. The stress test was stopped due to Infusion completion.   Blood pressure response was normal. The patient did not develop any  symptoms other than fatigue during the procedure.   Rest HR: 57bpm  Rest BP: 160/832mg  Max HR: 82bpm  Min BP: 140/8414m   ECG Interpretation:  Rest ECG:  normal sinus rhythm, none  Stress ECG:  sinus tachycardia  Recovery ECG:  normal sinus rhythm  ECG Interpretation:  non-diagnostic due to pharmacologic testing.  Stress Test Administered by: RobLeanor Rubenstein    Myocardial perfusion imaging was performed at rest 60 minutes following  the intravenous injection of 11 mCi of [id number] in the right  antecubital .  10 - 20 seconds after the Lexiscan infusion, the  patient  was intravenous injected with 11 mCi of Tc-51m 50mamibi followed by a 5  ml saline flush in the right antecubital.  Gated post-stress perfusion  imaging was performed 60 minutes after stress.     Gated LV Analysis:  TID Ratio= 1.03  EDV= 61  ESV= 16  SV= 45  LVEF= 74%   FINDINGS:  Regional wall motion:  reveals normal myocardial thickening and wall  motion.  Artifacts noted:  Breast attenuation  Study quality: good  Left ventricular cavity: normal.   Perfusion Analysis:  SPECT images demonstrate homogeneous tracer  distribution throughout the myocardium.    ECHO no prior available   TELEMETRY reviewed by me (LT) 05/14/2022 : Sinus rhythm with rate 60s with periods of artifact.  EKG reviewed by me: sinus rhythm artifact, rate 63  Data reviewed by me (LT) 05/14/2022: ED note, admission H&P, last PCP note, CBC, BMP, EKGs, telemetry, chest x-ray, troponins, BNP, vitals  ASSESSMENT AND PLAN:  Susan Salas 86yoF14yoF a PMH of ovarian cancer lumbar disc disease/spinal stenosis, hypertension, hypothyroidism, diverticulosis with h/o bowel perforation, osteoarthritis, Parkinson's disease [facility] with indigestion and chest discomfort. She was reportedly in VT for less than 1 minute per EMS (maintained a pulse and was alert) and possibly converted to atrial flutter. Cardiology is consulted for further assistance.   #Atypical chest pain #Query atrial flutter versus artifact on telemetry #Parkinsonism  #Hypertension Patient presented with 8 out of 10 "chest pressure" that is actually more so in her epigastrium which she described as indigestion, self resolving without intervention.  Her troponins are negative on 2 repeats, BNP also negative.  There was a question regarding new onset atrial flutter on her EKGs and telemetry, but further review by myself and Dr. ParasSaralyn Pilaral that it is likely her tremor is creating  artifact on the telemetry and EKGs that gives the appearance of flutter waves.  Her heart rate has been controlled in the 60s there has been no evidence of atrial fibrillation or heart block on telemetry thus far.  Her blood pressure has been rather elevated which may be contributory to her chest discomfort earlier today.  The patient's daughter's main concern is her increased confusion and visual hallucinations over the past couple weeks. -Continuous monitoring on telemetry while inpatient -Increase losartan from 25 mg once a day to 50 mg once a day -Continue Coreg CR '10mg'$  once daily  -Can place a Holter monitor for further evaluation of arrhythmias before discharge -No further cardiac diagnostics necessary  This patient's plan of  care was discussed and created with Dr. Saralyn Pilar and he is in agreement.  Signed: Tristan Schroeder , PA-C 05/14/2022, 12:20 PM [facility] Cardiology

## 2022-05-14 NOTE — H&P (Signed)
History and Physical    Patient: Susan Salas DGU:[id number] DOB: Feb 22, 1936 DOA: 05/14/2022 DOS: the patient was seen and examined on 05/14/2022 PCP: Adin Hector, MD  Patient coming from: SNF  Chief Complaint:  Chief Complaint  Patient presents with   Chest Pain    Pt. To ED from [place] via EMS for left sided CP that radiates to left arm with nausea at 0400 that lasted x3.5hours. pt. Denies pain currently. Pt. Received '324mg'$  ASA this AM. Had one minute of VT, converted to A-flutter, then NSR en route.   HPI: Susan Salas is a 86 y.o. 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 No family at bedside during my evaluation. Patient denied any symptoms. Blood pressure was elevated 170/104. Patient denied any headache nausea vomiting.  Per ER physician ENT felt on the monitor patient had less than 32nd runaway tach. Strips not available. She was noted to have EKG showing atrial flutter and repeat EKG showed sinus rhythm. ER physician spoke with cardiology Dr. perishable who recommended patient be admitted for overnight observation. Patient's troponin first set was negative.  Review of Systems: As mentioned in the history of present illness. All other systems reviewed and are negative. Past Medical History:  Diagnosis Date   Cancer ([place]) 2017   ovarian   CHF (congestive heart failure) (HCC)    Complication of anesthesia    hard to wake from general anesthesia, long porcedures nausea and vomiting.   Hypertension    Hypothyroidism    PONV (postoperative nausea and vomiting)    Spinal headache    with labor of 4 year old son.   Past Surgical History:  Procedure  Laterality Date   ABDOMINAL HYSTERECTOMY     AMPUTATION TOE Left 05/02/2017   Procedure: AMPUTATION TOE/Left 4th mpj/28820;  Surgeon: Sharlotte Alamo, DPM;  Location: [facility] ORS;  Service: Podiatry;  Laterality: Left;   APPENDECTOMY     BACK SURGERY  2014   lamenectomy   BREAST SURGERY Bilateral 2003   reduction   CATARACT EXTRACTION W/ INTRAOCULAR LENS IMPLANT Bilateral 2014   CHOLECYSTECTOMY     COLON SURGERY  2017   FRACTURE SURGERY Right 2003   screws & plates   KYPHOPLASTY N/A 02/26/2021   Procedure: L4 KYPHOPLASTY;  Surgeon: Hessie Knows, MD;  Location: [facility] ORS;  Service: Orthopedics;  Laterality: N/A;   TONSILLECTOMY     Social History:  reports that she quit smoking about 39 years ago. She has never used smokeless tobacco. She reports that she does not drink alcohol and does not use drugs.  Allergies  Allergen Reactions   Other Hives and Rash    "filler in all generic drugs"   Dissolvable stitches-do not dissolve   Statins Other (See Comments)    Muscle weakness Other reaction(s): Other (See Comments) Other reaction(s): Other (See Comments) Muscle weakness Muscle weakness Muscle weakness    Sulfa Antibiotics Shortness Of Breath   Sulfasalazine Shortness Of Breath   Ibandronic Acid Other (See Comments)    (BONIVA) Muscle Pain   Sertraline     Other reaction(s): UNKNOWN   Hydrochlorothiazide Rash   Lidocaine Rash   Morphine Rash   Ramipril Rash   Tape Rash    Pt can tolerate  paper tape.    No family history on file.  Prior to Admission medications   Medication Sig Start Date End Date Taking? Authorizing Provider  aspirin 81 MG chewable tablet Take 81 mg by mouth daily.   Yes [provider]  baclofen (LIORESAL) 10 MG tablet Take 5 mg by mouth 2 (two) times daily.   Yes [provider]  carvedilol (COREG CR) 10 MG 24 hr capsule Take 10 mg by mouth daily.   Yes [provider]  denosumab (PROLIA) 60 MG/ML SOSY injection Inject 60 mg  into the skin every 6 (six) months.   Yes [provider]  DULoxetine (CYMBALTA) 30 MG capsule Take 60 mg by mouth daily.   Yes [provider]  furosemide (LASIX) 40 MG tablet Take 40 mg by mouth daily as needed for edema.   Yes [provider]  gabapentin (NEURONTIN) 300 MG capsule Take 2 capsules (600 mg total) by mouth 3 (three) times daily. 03/06/21  Yes Ezekiel Slocumb, DO  levothyroxine (SYNTHROID) 88 MCG tablet Take 1 tablet (88 mcg total) by mouth daily before breakfast. 03/07/21  Yes Nicole Kindred A, DO  losartan (COZAAR) 25 MG tablet Take 25 mg by mouth daily.   Yes [provider]  rOPINIRole (REQUIP) 0.25 MG tablet Take 0.25 mg by mouth daily.   Yes [provider]  senna (SENOKOT) 8.6 MG tablet Take 8.6 mg by mouth 2 (two) times daily.   Yes [provider]  Vitamin D, Ergocalciferol, (DRISDOL) 1.25 MG (50000 UNIT) CAPS capsule Take 50,000 Units by mouth every Sunday.   Yes [provider]  vitamin E 1000 UNIT capsule Take 1,000 Units by mouth daily.   Yes [provider]    Physical Exam: Vitals:   05/14/22 1420 05/14/22 1430 05/14/22 1607 05/14/22 1943  BP: (!) 164/82 (!) 156/71 (!) 159/86 (!) 155/67  Pulse:  69 72 66  Resp: '15 10  17  '$ Temp:   98.1 F (36.7 C) 98.1 F (36.7 C)  TempSrc:    Oral  SpO2:  100% 94% 99%  Weight:      Height:         Assessment and Plan: PI: Susan Salas is a 86 y.o. female with medical history significant of , HTN, diastolic congestive heart failure, hypothyroidism comes in the emergency room from [place] with complains of chest heaviness and nausea this morning. No family at bedside during my evaluation. Patient denied any symptoms. Blood pressure was elevated 170/104. Patient denied any headache nausea vomiting.  Chest pain abnormal EKG -- patient is asymptomatic. -- Troponin five-- 5.0 -- chest pain free -- continue aspirin PRN Nitro --  patient was evaluated by Dr. Saralyn Pilar from [place] [facility]. -- EKG was reviewed by Dr. Ashok Croon felt this could be artifact/tremors from Parkinson's. -- Cardiology recommends outpatient monitor at discharge with follow-up  History of lumbar spinal stenosis, DJD history of parkinsonism -- patient is at home place of [place]. -- She tells me she does not ambulate much. Most of the time she is wheelchair-bound -- PT OT to see patient  Hypertension -- continue losartan and Coreg -- PRN hydralazine  Hypothyroidism -- Synthroid   Advance Care Planning:   Code Status: Full Code   Consults: [facility] cardiology   Severity of Illness: The appropriate patient status for this patient is OBSERVATION. Observation status is judged to be reasonable and necessary in order to provide the required intensity of service to ensure  the patient's safety. The patient's presenting symptoms, physical exam findings, and initial radiographic and laboratory data in the context of their medical condition is felt to place them at decreased risk for further clinical deterioration. Furthermore, it is anticipated that the patient will be medically stable for discharge from the hospital within 2 midnights of admission.   Author: Fritzi Mandes, MD 05/14/2022 7:46 PM  For on call review [email or web address].

## 2022-05-14 NOTE — Progress Notes (Signed)
PHARMACIST - PHYSICIAN COMMUNICATION  CONCERNING:  Enoxaparin (Lovenox) for DVT Prophylaxis    RECOMMENDATION: Patient was prescribed enoxaparin '40mg'$  q24 hours for VTE prophylaxis.   Filed Weights   05/14/22 0804  Weight: 98.7 kg (217 lb 9.5 oz)    Body mass index is 36.21 kg/m.  Estimated Creatinine Clearance: 60.9 mL/min (by C-G formula based on SCr of 0.6 mg/dL).   Based on [place] patient is candidate for enoxaparin 0.'5mg'$ /kg TBW SQ every 24 hours based on BMI being >30.  DESCRIPTION: Pharmacy has adjusted enoxaparin dose per [facility] policy.  Patient is now receiving enoxaparin 50 mg every 24 hours   Benita Gutter 05/14/2022 3:12 PM

## 2022-05-14 NOTE — ED Triage Notes (Signed)
Pt. To ED from [place] via EMS for left sided CP that radiates to left arm with nausea at 0400 that lasted x3.5hours. pt. Denies pain currently. Pt. Received '324mg'$  ASA this AM. Medic reports pt. Had one minute of VT, converted to A-flutter, then NSR en route.

## 2022-05-15 DIAGNOSIS — G20 Parkinson's disease: Secondary | ICD-10-CM

## 2022-05-15 DIAGNOSIS — R0789 Other chest pain: Secondary | ICD-10-CM | POA: Diagnosis not present

## 2022-05-15 DIAGNOSIS — I5032 Chronic diastolic (congestive) heart failure: Secondary | ICD-10-CM

## 2022-05-15 DIAGNOSIS — R079 Chest pain, unspecified: Secondary | ICD-10-CM | POA: Diagnosis not present

## 2022-05-15 LAB — ECHOCARDIOGRAM COMPLETE
Area-P 1/2: 2.91 cm2
Height: 65 in
S' Lateral: 2.3 cm
Weight: 3386.27 oz

## 2022-05-15 MED ORDER — LOSARTAN POTASSIUM 50 MG PO TABS: 50.0000 mg | ORAL_TABLET | Freq: Every day | ORAL | 1 refills | Status: AC

## 2022-05-15 NOTE — Discharge Summary (Signed)
Physician Discharge Summary   Patient: Susan Salas MRN: [id number] DOB: [birth year]  Admit date:     05/14/2022  Discharge date: 05/15/22  Discharge Physician: Susan Salas   PCP: Susan Salas   Recommendations at discharge:   Patient will return back to her facility with home health PT and OT Medication change: Cozaar increased from 25 mg to 50 mg Patient will follow-up with cardiology in 2 weeks as outpatient.  They will call to schedule appointment  Discharge Diagnoses: Active Problems:   Hypertension   Parkinsonism (HCC)   Chronic diastolic CHF (congestive heart failure) (HCC)   Hypothyroidism   Morbid obesity (HCC)  Principal Problem (Resolved):   Chest pain  Hospital Course: 86 year old female with past medical history lumbar spinal stenosis, Parkinson's disease, hypertension, diastolic heart failure and hypothyroidism presented to the emergency room on 9/12 with complaints of chest heaviness and nausea since morning.  At time of admission, blood pressure noted to be elevated at 178/104.  Initial EKG noted atrial flutter but then follow-up EKG noted sinus rhythm.  Patient's cardiologist was consulted who recommended watching for overnight observation.  The following day, troponins have all been normal.  BNP normal.  Based off patient's new onset atrial flutter, it looks to be more artifact caused by her Parkinson's rather than actual atrial fibrillation or flutter.  Blood pressure had been elevated on admission and Cozaar was increased.  Cardiology had a 7-day Holter monitor placed on 9/13.  Patient was seen by PT and OT who recommended skilled nursing, but patient declined and so she will be set up with home health.  She will follow-up with cardiology as outpatient next 2 to 3 weeks.  Assessment and Plan: * Chest pain-resolved as of 05/15/2022 Resolved.  Possibly due to elevated blood pressures..  No evidence of acute ACS.  Blood pressure medication adjusted as  below.  Hypertension Elevated on admission.  Cozaar increased by cardiology from 25 mg to 50 mg.  Parkinsonism ([place]) Recent diagnosis.  Patient still quite unsteady on her feet.  Seen by PT and OT who recommended skilled nursing which patient declined.  She was amenable to home health PT.  Being followed as outpatient by neurology.  Chronic diastolic CHF (congestive heart failure) (HCC) Euvolemic.  BNP within normal limits  Hypothyroidism Stable, continue Synthroid.  Morbid obesity ([place]) Meets criteria for BMI greater than 35 and comorbidity of hypertension plus heart failure         Consultants: Cardiology Procedures performed: None  Disposition: Assisted living Diet recommendation:  Discharge Diet Orders (From admission, onward)     Start     Ordered   05/15/22 0000  Diet - low sodium heart healthy        05/15/22 1337           Cardiac diet DISCHARGE MEDICATION: Allergies as of 05/15/2022       Reactions   Other Hives, Rash   "filler in all generic drugs" Dissolvable stitches-do not dissolve   Statins Other (See Comments)   Muscle weakness Other reaction(s): Other (See Comments) Other reaction(s): Other (See Comments) Muscle weakness Muscle weakness Muscle weakness   Sulfa Antibiotics Shortness Of Breath   Sulfasalazine Shortness Of Breath   Ibandronic Acid Other (See Comments)   (BONIVA) Muscle Pain   Sertraline    Other reaction(s): UNKNOWN   Hydrochlorothiazide Rash   Lidocaine Rash   Morphine Rash   Ramipril Rash   Tape Rash   Pt can  tolerate paper tape.        Medication List     TAKE these medications    aspirin 81 MG chewable tablet Take 81 mg by mouth daily.   baclofen 10 MG tablet Commonly known as: LIORESAL Take 5 mg by mouth 2 (two) times daily.   carvedilol 10 MG 24 hr capsule Commonly known as: COREG CR Take 10 mg by mouth daily.   DULoxetine 30 MG capsule Commonly known as: CYMBALTA Take 60 mg by mouth daily.    furosemide 40 MG tablet Commonly known as: LASIX Take 40 mg by mouth daily as needed for edema.   gabapentin 300 MG capsule Commonly known as: NEURONTIN Take 2 capsules (600 mg total) by mouth 3 (three) times daily.   levothyroxine 88 MCG tablet Commonly known as: SYNTHROID Take 1 tablet (88 mcg total) by mouth daily before breakfast.   losartan 50 MG tablet Commonly known as: COZAAR Take 1 tablet (50 mg total) by mouth daily. Start taking on: May 16, 2022 What changed:  medication strength how much to take   Prolia 60 MG/ML Sosy injection Generic drug: denosumab Inject 60 mg into the skin every 6 (six) months.   rOPINIRole 0.25 MG tablet Commonly known as: REQUIP Take 0.25 mg by mouth daily.   senna 8.6 MG tablet Commonly known as: SENOKOT Take 8.6 mg by mouth 2 (two) times daily.   Vitamin D (Ergocalciferol) 1.25 MG (50000 UNIT) Caps capsule Commonly known as: DRISDOL Take 50,000 Units by mouth every 'Sunday.   vitamin E 1000 UNIT capsule Take 1,000 Units by mouth daily.        Follow-up Information     Paraschos, Alexander, Salas. Go in 3 week(s).   Specialty: Cardiology Why: Appointment on Monday, 06/17/2022 at 11:15am. Contact information: [address] [facility] [place] [address] [phone]                Discharge Exam: Filed Weights   05/14/22 0804 05/14/22 1345  Weight: 98.7 kg 96 kg    General: Alert and oriented x3, no acute distress cardiovascular: Regular rate and rhythm, S1-S2 Lungs: Clear to auscultation bilaterally  Condition at discharge: good  The results of significant diagnostics from this hospitalization (including imaging, microbiology, ancillary and laboratory) are listed below for reference.   Imaging Studies: ECHOCARDIOGRAM COMPLETE  Result Date: 05/15/2022    ECHOCARDIOGRAM REPORT   Patient Name:   Susan Salas Date of Exam: 05/14/2022 Medical Rec #:  [id number]         Height:       65.0 in Accession #:    [id number]       Weight:       211.6 lb Date of Birth:  08/02/1936         BSA:          2.026 m Patient Age:    86 years         BP:           159/86 mmHg Patient Gender: F                HR:           72'$  bpm. Exam Location:  [facility] Procedure: 2D Echo, Cardiac Doppler and Color Doppler Indications:     R94.31 Abnormal EKG  History:         Patient has no prior history of Echocardiogram examinations.  CHF; Risk Factors:Hypertension. Hypothyroidism.  Sonographer:     Cresenciano Lick RDCS Referring Phys:  [place] Diagnosing Phys: Isaias Cowman Salas IMPRESSIONS  1. Left ventricular ejection fraction, by estimation, is 60 to 65%. The left ventricle has normal function. The left ventricle has no regional wall motion abnormalities. Left ventricular diastolic parameters were normal.  2. Right ventricular systolic function is normal. The right ventricular size is normal.  3. The mitral valve is normal in structure. Mild mitral valve regurgitation. No evidence of mitral stenosis.  4. The aortic valve is normal in structure. Aortic valve regurgitation is not visualized. No aortic stenosis is present.  5. The inferior vena cava is normal in size with greater than 50% respiratory variability, suggesting right atrial pressure of 3 mmHg. FINDINGS  Left Ventricle: Left ventricular ejection fraction, by estimation, is 60 to 65%. The left ventricle has normal function. The left ventricle has no regional wall motion abnormalities. The left ventricular internal cavity size was normal in size. There is  no left ventricular hypertrophy. Left ventricular diastolic parameters were normal. Right Ventricle: The right ventricular size is normal. No increase in right ventricular wall thickness. Right ventricular systolic function is normal. Left Atrium: Left atrial size was normal in size. Right Atrium: Right atrial size was normal in size. Pericardium: There is no evidence  of pericardial effusion. Mitral Valve: The mitral valve is normal in structure. Mild mitral valve regurgitation. No evidence of mitral valve stenosis. Tricuspid Valve: The tricuspid valve is normal in structure. Tricuspid valve regurgitation is mild . No evidence of tricuspid stenosis. Aortic Valve: The aortic valve is normal in structure. Aortic valve regurgitation is not visualized. No aortic stenosis is present. Pulmonic Valve: The pulmonic valve was normal in structure. Pulmonic valve regurgitation is not visualized. No evidence of pulmonic stenosis. Aorta: The aortic root is normal in size and structure. Venous: The inferior vena cava is normal in size with greater than 50% respiratory variability, suggesting right atrial pressure of 3 mmHg. IAS/Shunts: No atrial level shunt detected by color flow Doppler.  LEFT VENTRICLE PLAX 2D LVIDd:         4.10 cm   Diastology LVIDs:         2.30 cm   LV e' medial:    5.84 cm/s LV PW:         1.00 cm   LV E/e' medial:  9.6 LV IVS:        1.00 cm   LV e' lateral:   8.08 cm/s LVOT diam:     2.00 cm   LV E/e' lateral: 7.0 LV SV:         82 LV SV Index:   41 LVOT Area:     3.14 cm  RIGHT VENTRICLE RV Basal diam:  3.00 cm RV S prime:     19.10 cm/s TAPSE (M-mode): 2.2 cm LEFT ATRIUM             Index        RIGHT ATRIUM          Index LA diam:        3.40 cm 1.68 cm/m   RA Area:     9.99 cm LA Vol (A2C):   36.8 ml 18.16 ml/m  RA Volume:   22.70 ml 11.20 ml/m LA Vol (A4C):   29.0 ml 14.31 ml/m LA Biplane Vol: 34.6 ml 17.08 ml/m  AORTIC VALVE LVOT Vmax:   123.50 cm/s LVOT Vmean:  78.750 cm/s LVOT  VTI:    0.262 m  AORTA Ao Root diam: 3.10 cm Ao Asc diam:  3.90 cm MITRAL VALVE MV Area (PHT): 2.91 cm    SHUNTS MV Decel Time: 261 msec    Systemic VTI:  0.26 m MV E velocity: 56.20 cm/s  Systemic Diam: 2.00 cm MV A velocity: 79.50 cm/s MV E/A ratio:  0.71 Isaias Cowman Salas Electronically signed by Isaias Cowman Salas Signature Date/Time: 05/15/2022/7:59:44 AM    Final     DG Chest Portable 1 View  Result Date: 05/14/2022 CLINICAL DATA:  Shortness of breath.  Chest pain. EXAM: PORTABLE CHEST 1 VIEW COMPARISON:  None Available. FINDINGS: 0834 hours. Lungs are hyperexpanded. The cardio pericardial silhouette is enlarged. There is pulmonary vascular congestion without overt pulmonary edema. Streaky opacity at the left base suggest atelectasis. No overt airspace edema or pleural effusion. Telemetry leads overlie the chest. IMPRESSION: Enlargement of the cardiopericardial silhouette with pulmonary vascular congestion. Electronically Signed   By: Misty Stanley M.D.   On: 05/14/2022 08:43   DG Tibia/Fibula Right  Result Date: 05/05/2022 CLINICAL DATA:  Lower leg pain after a fall. EXAM: RIGHT TIBIA AND FIBULA - 2 VIEW COMPARISON:  None Available. FINDINGS: There is no evidence of fracture or other focal bone lesions. Soft tissues are unremarkable. Degenerative spurring noted at the knee. IMPRESSION: Negative. Electronically Signed   By: Misty Stanley M.D.   On: 05/05/2022 10:09   DG Hip Unilat W or Wo Pelvis 2-3 Views Right  Result Date: 05/05/2022 CLINICAL DATA:  Right hip pain after fall. EXAM: DG HIP (WITH OR WITHOUT PELVIS) 2-3V RIGHT COMPARISON:  None Available. FINDINGS: There is no evidence of hip fracture or dislocation. Mild narrowing of the right hip joint is noted. IMPRESSION: Mild degenerative joint disease of right hip. No acute abnormality seen. Electronically Signed   By: Marijo Conception M.D.   On: 05/05/2022 09:05   CT Lumbar Spine Wo Contrast  Result Date: 05/05/2022 CLINICAL DATA:  86 year old female status post fall from wheelchair. EXAM: CT LUMBAR SPINE WITHOUT CONTRAST TECHNIQUE: Multidetector CT imaging of the lumbar spine was performed without intravenous contrast administration. Multiplanar CT image reconstructions were also generated. RADIATION DOSE REDUCTION: This exam was performed according to the departmental dose-optimization program which  includes automated exposure control, adjustment of the mA and/or kV according to patient size and/or use of iterative reconstruction technique. COMPARISON:  Lumbar MRI 05/25/2021. FINDINGS: Segmentation: Normal, the same numbering system used on the MRI last year. Alignment: Stable. Mild chronic straightening of lumbar lordosis, mild chronic retrolisthesis of L1 on L2. Vertebrae: Chronic, previously augmented moderate to severe L4 compression fracture. Chronic retropulsion of the posterosuperior endplate, and prior posterior decompression of that and the adjacent levels. Osteopenia. Stable visible lower thoracic and lumbar vertebral height elsewhere. Visible sacrum and SI joints appear intact. No acute osseous abnormality identified. Paraspinal and other soft tissues: Normal caliber abdominal aorta. Aortoiliac calcified atherosclerosis. Chronic postoperative changes to the lumbar paraspinal soft tissues. Disc levels: Superimposed lumbar spine degeneration appears stable since last year with mild chronic multifactorial spinal stenosis at L1-L2. IMPRESSION: 1. No acute traumatic injury identified in the lumbar spine. 2. Stable chronic L4 compression fracture with retropulsion, previous augmentation, and with chronic posterior decompression of that and the adjacent levels. 3. Aortic Atherosclerosis (ICD10-I70.0). Electronically Signed   By: Genevie Ann M.D.   On: 05/05/2022 08:59   CT Cervical Spine Wo Contrast  Result Date: 05/05/2022 CLINICAL DATA:  86 year old female status post fall  from wheelchair. Parkinson's disease. EXAM: CT CERVICAL SPINE WITHOUT CONTRAST TECHNIQUE: Multidetector CT imaging of the cervical spine was performed without intravenous contrast. Multiplanar CT image reconstructions were also generated. RADIATION DOSE REDUCTION: This exam was performed according to the departmental dose-optimization program which includes automated exposure control, adjustment of the mA and/or kV according to patient  size and/or use of iterative reconstruction technique. COMPARISON:  09/25/2020. FINDINGS: Alignment: Stable straightening of cervical lordosis. Cervicothoracic junction alignment is within normal limits. Bilateral posterior element alignment is within normal limits. Skull base and vertebrae: Visualized skull base is intact. No atlanto-occipital dissociation. C1 and C2 appear intact and aligned. No acute osseous abnormality identified. Soft tissues and spinal canal: No prevertebral fluid or swelling. No visible canal hematoma. Chronic postoperative changes to the posterior paraspinal soft tissues. Stable visible noncontrast neck soft tissues, aside from an increased retropharyngeal course of the right carotid since last year. Disc levels: Stable. Chronic posterior spinal decompression from C3 through C7. Upper chest: Stable. IMPRESSION: No acute traumatic injury identified, stable CT appearance of the cervical spine with prior posterior spinal decompression from C3 through C7. Electronically Signed   By: Genevie Ann M.D.   On: 05/05/2022 08:54   CT Head Wo Contrast  Result Date: 05/05/2022 CLINICAL DATA:  86 year old female status post fall from wheelchair. Parkinson's disease. EXAM: CT HEAD WITHOUT CONTRAST TECHNIQUE: Contiguous axial images were obtained from the base of the skull through the vertex without intravenous contrast. RADIATION DOSE REDUCTION: This exam was performed according to the departmental dose-optimization program which includes automated exposure control, adjustment of the mA and/or kV according to patient size and/or use of iterative reconstruction technique. COMPARISON:  Brain MRI 10/01/2018.  Head CT 09/25/2020. FINDINGS: Brain: Stable cerebral volume. Stable ventricle size and configuration. No midline shift, mass effect, or evidence of intracranial mass lesion. No acute intracranial hemorrhage identified. Stable gray-white matter differentiation throughout the brain. No cortically based  acute infarct identified. Vascular: Calcified atherosclerosis at the skull base. No suspicious intracranial vascular hyperdensity. Skull: No acute osseous abnormality identified. Sinuses/Orbits: Visualized paranasal sinuses and mastoids are clear. Other: No acute orbit or scalp soft tissue injury identified. IMPRESSION: No acute intracranial abnormality or acute traumatic injury identified. Electronically Signed   By: Genevie Ann M.D.   On: 05/05/2022 08:51    Microbiology: Results for orders placed or performed during the hospital encounter of 05/14/22  SARS Coronavirus 2 by RT PCR (hospital order, performed in [facility] hospital lab) *cepheid single result test* Anterior Nasal Swab     Status: None   Collection Time: 05/14/22  8:33 AM   Specimen: Anterior Nasal Swab  Result Value Ref Range Status   SARS Coronavirus 2 by RT PCR NEGATIVE NEGATIVE Final    Comment: (NOTE) SARS-CoV-2 target nucleic acids are NOT DETECTED.  The SARS-CoV-2 RNA is generally detectable in upper and lower respiratory specimens during the acute phase of infection. The lowest concentration of SARS-CoV-2 viral copies this assay can detect is 250 copies / mL. A negative result does not preclude SARS-CoV-2 infection and should not be used as the sole basis for treatment or other patient management decisions.  A negative result may occur with improper specimen collection / handling, submission of specimen other than nasopharyngeal swab, presence of viral mutation(s) within the areas targeted by this assay, and inadequate number of viral copies (<250 copies / mL). A negative result must be combined with clinical observations, patient history, and epidemiological information.  Fact Sheet for Patients:  https://www.patel.info/  Fact Sheet for Healthcare Providers: https://hall.com/  This test is not yet approved or  cleared by the [place] FDA and has been authorized for  detection and/or diagnosis of SARS-CoV-2 by FDA under an Emergency Use Authorization (EUA).  This EUA will remain in effect (meaning this test can be used) for the duration of the COVID-19 declaration under Section 564(b)(1) of the Act, 21 U.S.C. section 360bbb-3(b)(1), unless the authorization is terminated or revoked sooner.  Performed at [facility], [address] [place], [place] [address]   MRSA Next Gen by PCR, Nasal     Status: None   Collection Time: 05/14/22  9:20 PM   Specimen: Nasal Mucosa; Nasal Swab  Result Value Ref Range Status   MRSA by PCR Next Gen NOT DETECTED NOT DETECTED Final    Comment: (NOTE) The GeneXpert MRSA Assay (FDA approved for NASAL specimens only), is one component of a comprehensive MRSA colonization surveillance program. It is not intended to diagnose MRSA infection nor to guide or monitor treatment for MRSA infections. Test performance is not FDA approved in patients less than 26 years old. Performed at [facility], [address] [place], [place] [address]     Labs: CBC: Recent Labs  Lab 05/14/22 0833  WBC 7.5  NEUTROABS 5.1  HGB 14.9  HCT 46.9*  MCV 97.3  PLT 378   Basic Metabolic Panel: Recent Labs  Lab 05/14/22 0833  NA 141  K 4.4  CL 106  CO2 25  GLUCOSE 104*  BUN 14  CREATININE 0.60  CALCIUM 9.3  MG 2.1   Liver Function Tests: Recent Labs  Lab 05/14/22 0833  AST 22  ALT 24  ALKPHOS 53  BILITOT 1.1  PROT 6.8  ALBUMIN 3.9   CBG: No results for input(s): "GLUCAP" in the last 168 hours.  Discharge time spent: less than 30 minutes.  Signed: Annita Brod, Salas [facility] 05/15/2022

## 2022-05-15 NOTE — Assessment & Plan Note (Signed)
Stable, continue Synthroid.

## 2022-05-15 NOTE — Assessment & Plan Note (Signed)
Elevated on admission.  Cozaar increased by cardiology from 25 mg to 50 mg.

## 2022-05-15 NOTE — Assessment & Plan Note (Signed)
Euvolemic.  BNP within normal limits

## 2022-05-15 NOTE — Assessment & Plan Note (Signed)
Recent diagnosis.  Patient still quite unsteady on her feet.  Seen by PT and OT who recommended skilled nursing which patient declined.  She was amenable to home health PT.  Being followed as outpatient by neurology.

## 2022-05-15 NOTE — Evaluation (Signed)
Physical Therapy Evaluation Patient Details Name: Susan Salas MRN: [id number] DOB: 1936/05/11 Today's Date: 05/15/2022  History of Present Illness  Susan Salas is a 86 y.o. 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  Clinical Impression  The pt presents this session with a great willingness to "try" with PT. She reports use of self propelling w/c for baseline mobility, needing Mod A for transfers into w/c. The pt demonstrates decreased ability to laterally weight shift to participate in off-weighting of LE for the transfer. She also requires increased assistance for manuevering the RW during the transfer. She reports 5 falls in the past 6 months. At this time she would best from SNF in order to optimize safety with mobility. PT will continue to follow.      Recommendations for follow up therapy are one component of a multi-disciplinary discharge planning process, led by the attending physician.  Recommendations may be updated based on patient status, additional functional criteria and insurance authorization.  Follow Up Recommendations Skilled nursing-short term rehab (<3 hours/day) Can patient physically be transported by private vehicle: No    Assistance Recommended at Discharge Frequent or constant Supervision/Assistance  Patient can return home with the following  Two people to help with walking and/or transfers;A lot of help with bathing/dressing/bathroom;Assist for transportation;Help with stairs or ramp for entrance    Equipment Recommendations None recommended by PT  Recommendations for Other Services       Functional Status Assessment Patient has had a recent decline in  their functional status and/or demonstrates limited ability to make significant improvements in function in a reasonable and predictable amount of time     Precautions / Restrictions Precautions Precautions: Fall Restrictions Weight Bearing Restrictions: No      Mobility  Bed Mobility Overal bed mobility: Needs Assistance Bed Mobility: Supine to Sit     Supine to sit: Min assist     General bed mobility comments: Educated patient in the log roll technique for supine<>sit    Transfers Overall transfer level: Needs assistance Equipment used: Rolling walker (2 wheels) Transfers: Sit to/from Stand, Bed to chair/wheelchair/BSC Sit to Stand: Min assist   Step pivot transfers: Mod assist       General transfer comment: Pt requiring Min A to stand from bed with verbal cues for hand placement. During step pivot transfer the pt requires assistance with lateral weightshifting to off weight contralateral limb. Pt also requiring assistance to manuever RW during transfer.    Ambulation/Gait                  Stairs            Wheelchair Mobility    Modified Rankin (Stroke Patients Only)       Balance Overall balance assessment: History of Falls, Needs assistance   Sitting balance-Leahy Scale: Fair       Standing balance-Leahy Scale: Poor                               Pertinent Vitals/Pain Pain Assessment Pain Assessment: 0-10 Pain Score: 0-No pain Pain Location: Initially reports 0/10 pain at rest, but pain increases to 8/10 with mobility. Pain Descriptors / Indicators: Sore Pain Intervention(s): Monitored  during session    [place] expects to be discharged to:: Assisted living                 Home Equipment: Wheelchair - Publishing copy (2 wheels)      Prior Function Prior Level of Function : Needs assist;History of Falls (last six months)       Physical Assist : Mobility (physical);ADLs  (physical) Mobility (physical): Transfers;Gait ADLs (physical): Dressing;Bathing;Toileting Mobility Comments: Pt reports ~Mod A for stand pivot transfers. She states she could independently UE propel w/c. She reports only ambulating short distances, requiring assistance for this task as well. ADLs Comments: Pt reports need for assistance for bathing, LE dressing, and hygiene following toileting.     Hand Dominance   Dominant Hand: Right    Extremity/Trunk Assessment   Upper Extremity Assessment Upper Extremity Assessment: Generalized weakness    Lower Extremity Assessment Lower Extremity Assessment: Generalized weakness    Cervical / Trunk Assessment Cervical / Trunk Assessment: Kyphotic  Communication   Communication: No difficulties  Cognition Arousal/Alertness: Awake/alert Behavior During Therapy: WFL for tasks assessed/performed Overall Cognitive Status: Within Functional Limits for tasks assessed                                          General Comments      Exercises     Assessment/Plan    PT Assessment Patient needs continued PT services  PT Problem List Decreased strength;Decreased mobility;Decreased balance;Decreased activity tolerance       PT Treatment Interventions Functional mobility training;Balance training;Gait training;Therapeutic exercise;Therapeutic activities    PT Goals (Current goals can be found in the Care Plan section)  Acute Rehab PT Goals Patient Stated Goal: return to a location close to husband PT Goal Formulation: With patient Time For Goal Achievement: 05/29/22 Potential to Achieve Goals: Good    Frequency Min 2X/week     Co-evaluation               AM-PAC PT "6 Clicks" Mobility  Outcome Measure Help needed turning from your back to your side while in a flat bed without using bedrails?: A Little Help needed moving from lying on your back to sitting on the side of a flat bed without using bedrails?: A  Little Help needed moving to and from a bed to a chair (including a wheelchair)?: A Lot Help needed standing up from a chair using your arms (e.g., wheelchair or bedside chair)?: A Little Help needed to walk in hospital room?: Total Help needed climbing 3-5 steps with a railing? : Total 6 Click Score: 13    End of Session Equipment Utilized During Treatment: Gait belt Activity Tolerance: Patient tolerated treatment well Patient left: in chair;with chair alarm set;with call bell/phone within reach Nurse Communication: Mobility status PT Visit Diagnosis: Muscle weakness (generalized) (M62.81);Difficulty in walking, not elsewhere classified (R26.2);Unsteadiness on feet (R26.81)    Time: 5726-2035 PT Time Calculation (min) (ACUTE ONLY): 40 min   Charges:   PT Evaluation $PT Eval Moderate Complexity: 1 Mod PT Treatments $Therapeutic Activity: 23-37 mins        1:17 PM, 05/15/22 Marthena Whitmyer A. Saverio Danker PT, DPT Physical Therapist - [facility]   Cina Klumpp A Dymin Dingledine 05/15/2022, 1:06 PM

## 2022-05-15 NOTE — TOC Transition Note (Signed)
Transition of Care [facility]) - CM/SW Discharge Note   Patient Details  Name: Susan Salas MRN: [id number] Date of Birth: 11-Jan-1936  Transition of [facility]) CM/SW Contact:  Alberteen Sam, LCSW Phone Number: 05/15/2022, 1:57 PM   Clinical Narrative:     Patient to discharge home today back to [facility] as she declines SNF. CSW spoke with Merleen Nicely at [facility] who reports patient can return, requested dc summary be faxed to [phone]. They report they have PT in house at the facility. CSW spoke with patient's spouse who is in agreement with above plan, no questions or concerns at this time and aware patient will transport via EMS. EMS forms on chart.    Final next level of care: Assisted Living Barriers to Discharge: No Barriers Identified   Patient Goals and CMS Choice Patient states their goals for this hospitalization and ongoing recovery are:: to go home CMS Medicare.gov Compare Post Acute Care list provided to:: Patient Choice offered to / list presented to : Patient  Discharge Placement                       Discharge Plan and Services                                     Social Determinants of Health (SDOH) Interventions     Readmission Risk Interventions     No data to display

## 2022-05-15 NOTE — Progress Notes (Signed)
Physical Therapy Treatment Patient Details Name: Susan Salas MRN: [id number] DOB: Dec 19, 1935 Today's Date: 05/15/2022   History of Present Illness Susan Salas is a 86 y.o. female with medical history significant of history of lumbar spinal stenosis, diverticulosis, small bowel obstruction/adenocarcinoma found at the time of surgery and parkinsonism HTN, diastolic congestive heart failure, hypothyroidism comes in the emergency room from [facility] with complains of chest heaviness and nausea this morning.    PT Comments    PT entered for bid session to assist with return to bed. Pt found sitting upright on BSC. Pt dependent for toileting hygiene. She requires step by step instruction on return transfer to bed. She continues to require assistance for weight shifting and RW management. PT will continue to follow.     Recommendations for follow up therapy are one component of a multi-disciplinary discharge planning process, led by the attending physician.  Recommendations may be updated based on patient status, additional functional criteria and insurance authorization.  Follow Up Recommendations  Skilled nursing-short term rehab (<3 hours/day) Can patient physically be transported by private vehicle: No   Assistance Recommended at Discharge Frequent or constant Supervision/Assistance  Patient can return home with the following Two people to help with walking and/or transfers;A lot of help with bathing/dressing/bathroom;Assist for transportation;Help with stairs or ramp for entrance   Equipment Recommendations  None recommended by PT    Recommendations for Other Services       Precautions / Restrictions Precautions Precautions: Fall Restrictions Weight Bearing Restrictions: No     Mobility  Bed Mobility Overal bed mobility: Needs Assistance Bed Mobility: Sit to Supine     Supine to sit: Min assist Sit to supine: Mod assist   General bed mobility comments:  for LE management.    Transfers Overall transfer level: Needs assistance Equipment used: Rolling walker (2 wheels) Transfers: Bed to chair/wheelchair/BSC, Sit to/from Stand Sit to Stand: Mod assist (bilateral block on knees to prevent buckling)   Step pivot transfers: Mod assist, +2 physical assistance       General transfer comment: Pt requiring Min A to stand from bed with verbal cues for hand placement. During step pivot transfer the pt requires assistance with lateral weightshifting to off weight contralateral limb. Pt also requiring assistance to manuever RW during transfer.    Ambulation/Gait                   Stairs             Wheelchair Mobility    Modified Rankin (Stroke Patients Only)       Balance Overall balance assessment: History of Falls, Needs assistance   Sitting balance-Leahy Scale: Fair       Standing balance-Leahy Scale: Poor                              Cognition Arousal/Alertness: Awake/alert Behavior During Therapy: WFL for tasks assessed/performed Overall Cognitive Status: Within Functional Limits for tasks assessed                                          Exercises      General Comments        Pertinent Vitals/Pain Pain Assessment Pain Assessment: No/denies pain Pain Score: 0-No pain Pain Location: Initially reports 0/10 pain at rest, but pain increases  to 8/10 with mobility. Pain Descriptors / Indicators: Sore Pain Intervention(s): Monitored during session    Home Living Family/patient expects to be discharged to:: Assisted living                 Home Equipment: Wheelchair - Publishing copy (2 wheels)      Prior Function            PT Goals (current goals can now be found in the care plan section) Acute Rehab PT Goals Patient Stated Goal: return to a location close to husband PT Goal Formulation: With patient Time For Goal Achievement: 05/29/22 Potential to  Achieve Goals: Good Progress towards PT goals: Progressing toward goals    Frequency    Min 2X/week      PT Plan Current plan remains appropriate    Co-evaluation              AM-PAC PT "6 Clicks" Mobility   Outcome Measure  Help needed turning from your back to your side while in a flat bed without using bedrails?: A Little Help needed moving from lying on your back to sitting on the side of a flat bed without using bedrails?: A Little Help needed moving to and from a bed to a chair (including a wheelchair)?: A Lot Help needed standing up from a chair using your arms (e.g., wheelchair or bedside chair)?: A Little Help needed to walk in hospital room?: Total Help needed climbing 3-5 steps with a railing? : Total 6 Click Score: 13    End of Session Equipment Utilized During Treatment: Gait belt Activity Tolerance: Patient tolerated treatment well Patient left: in bed;with bed alarm set;with call bell/phone within reach;with nursing/sitter in room Nurse Communication: Mobility status PT Visit Diagnosis: Muscle weakness (generalized) (M62.81);Difficulty in walking, not elsewhere classified (R26.2);Unsteadiness on feet (R26.81)     Time: 1125-1140 PT Time Calculation (min) (ACUTE ONLY): 15 min  Charges:  $Therapeutic Activity: 8-22 mins                     1:27 PM, 05/15/22 Susan Salas A. Saverio Danker PT, DPT Physical Therapist - [facility] A Susan Salas 05/15/2022, 1:25 PM

## 2022-05-15 NOTE — Assessment & Plan Note (Signed)
Resolved.  Possibly due to elevated blood pressures..  No evidence of acute ACS.  Blood pressure medication adjusted as below.

## 2022-05-15 NOTE — Hospital Course (Signed)
86 year old female with past medical history lumbar spinal stenosis, Parkinson's disease, hypertension, diastolic heart failure and hypothyroidism presented to the emergency room on 9/12 with complaints of chest heaviness and nausea since morning.  At time of admission, blood pressure noted to be elevated at 178/104.  Initial EKG noted atrial flutter but then follow-up EKG noted sinus rhythm.  Patient's cardiologist was consulted who recommended watching for overnight observation.  The following day, troponins have all been normal.  BNP normal.  Based off patient's new onset atrial flutter, it looks to be more artifact caused by her Parkinson's rather than actual atrial fibrillation or flutter.  Blood pressure had been elevated on admission and Cozaar was increased.  Cardiology had a 7-day Holter monitor placed on 9/13.  Patient was seen by PT and OT who recommended skilled nursing, but patient declined and so she will be set up with home health.  She will follow-up with cardiology as outpatient next 2 to 3 weeks.

## 2022-05-15 NOTE — Assessment & Plan Note (Signed)
Meets criteria for BMI greater than 35 and comorbidity of hypertension plus heart failure

## 2022-05-15 NOTE — Progress Notes (Signed)
Report called to Elvina Mattes, medication tech supervisor @ [facility]. Patient awaiting EMS transport back to facility.

## 2022-05-15 NOTE — Progress Notes (Addendum)
[place] NOTE       Patient ID: TALEIGHA PINSON MRN: [id number] DOB/AGE: 84-Jul-1937 84 y.o.  Admit date: 05/14/2022 Referring Physician Dr. Fritzi Mandes   Primary Physician Dr. Ramonita Lab Primary Cardiologist none Reason for Consultation ?atrial flutter  HPI: Malori Myers. Amison is an 86yoF with a PMH of ovarian cancer lumbar disc disease/spinal stenosis, hypertension, hypothyroidism, diverticulosis with h/o bowel perforation, osteoarthritis, Parkinson's disease who presented to [facility] ED from [place] on 05/14/22 with indigestion and chest discomfort. She was reportedly in VT for less than 1 minute per EMS (maintained a pulse and was alert) and possibly converted to atrial flutter. Cardiology is consulted for further assistance.   Interval history: -No acute events -She is alert and oriented to self, place, time and situation today.  No family at bedside -Denies further chest/epigastric discomfort since she has been in the hospital -Reviewed telemetry and there still appears to be significant artifact (tremor) without evidence of atrial flutter or fibrillation. -Echo resulted with preserved LVEF without significant valvular pathologies.  Review of systems complete and found to be negative unless listed above     Past Medical History:  Diagnosis Date   Cancer ([place]) 2017   ovarian   CHF (congestive heart failure) (HCC)    Complication of anesthesia    hard to wake from general anesthesia, long porcedures nausea and vomiting.   Hypertension    Hypothyroidism    PONV (postoperative nausea and vomiting)    Spinal headache    with labor of 7 year old son.    Past Surgical History:  Procedure Laterality Date   ABDOMINAL HYSTERECTOMY     AMPUTATION TOE Left 05/02/2017   Procedure: AMPUTATION TOE/Left 4th [id number]/28820;  Surgeon: Sharlotte Alamo, DPM;  Location: [facility] ORS;  Service: Podiatry;  Laterality: Left;   APPENDECTOMY     BACK SURGERY  2014    lamenectomy   BREAST SURGERY Bilateral 2003   reduction   CATARACT EXTRACTION W/ INTRAOCULAR LENS IMPLANT Bilateral 2014   CHOLECYSTECTOMY     COLON SURGERY  2017   FRACTURE SURGERY Right 2003   screws & plates   KYPHOPLASTY N/A 02/26/2021   Procedure: L4 KYPHOPLASTY;  Surgeon: Hessie Knows, MD;  Location: [facility] ORS;  Service: Orthopedics;  Laterality: N/A;   TONSILLECTOMY      Medications Prior to Admission  Medication Sig Dispense Refill Last Dose   aspirin 81 MG chewable tablet Take 81 mg by mouth daily.   05/13/2022 at 0900   baclofen (LIORESAL) 10 MG tablet Take 5 mg by mouth 2 (two) times daily.   05/13/2022 at 2000   carvedilol (COREG CR) 10 MG 24 hr capsule Take 10 mg by mouth daily.   05/13/2022 at 0900   denosumab (PROLIA) 60 MG/ML SOSY injection Inject 60 mg into the skin every 6 (six) months.      DULoxetine (CYMBALTA) 30 MG capsule Take 60 mg by mouth daily.   05/13/2022 at 0900   furosemide (LASIX) 40 MG tablet Take 40 mg by mouth daily as needed for edema.   Unknown at PRN   gabapentin (NEURONTIN) 300 MG capsule Take 2 capsules (600 mg total) by mouth 3 (three) times daily.   05/13/2022 at 1700   levothyroxine (SYNTHROID) 88 MCG tablet Take 1 tablet (88 mcg total) by mouth daily before breakfast.   05/13/2022 at 0600   losartan (COZAAR) 25 MG tablet Take 25 mg by mouth daily.   05/13/2022 at 0900  rOPINIRole (REQUIP) 0.25 MG tablet Take 0.25 mg by mouth daily.   05/10/2022 at 0900   senna (SENOKOT) 8.6 MG tablet Take 8.6 mg by mouth 2 (two) times daily.   05/13/2022 at 2000   Vitamin D, Ergocalciferol, (DRISDOL) 1.25 MG (50000 UNIT) CAPS capsule Take 50,000 Units by mouth every Sunday.   05/12/2022 at 0900   vitamin E 1000 UNIT capsule Take 1,000 Units by mouth daily.   05/09/2022 at 0900    Social History   Socioeconomic History   Marital status: Married    Spouse name: Not on file   Number of children: Not on file   Years of education: Not on file   Highest education level:  Not on file  Occupational History   Not on file  Tobacco Use   Smoking status: Former    Types: Cigarettes    Quit date: 04/29/1983    Years since quitting: 39.0   Smokeless tobacco: Never  Vaping Use   Vaping Use: Never used  Substance and Sexual Activity   Alcohol use: No   Drug use: No   Sexual activity: Not on file  Other Topics Concern   Not on file  Social History Narrative   Not on file   Social Determinants of Health   Financial Resource Strain: Not on file  Food Insecurity: Not on file  Transportation Needs: Not on file  Physical Activity: Not on file  Stress: Not on file  Social Connections: Not on file  Intimate Partner Violence: Not on file    No family history on file.    PHYSICAL EXAM General: Elderly Caucasian female, well nourished, in no acute distress.  Sitting on commode without family at bedside.  Resting tremor to right upper extremity HEENT:  Normocephalic and atraumatic. Neck:  No JVD.  Lungs: Normal respiratory effort on room air.  Decreased breath sounds without appreciable crackles or wheezes.  Chest: Holter monitor in place left chest  Heart: HRRR . Normal S1 and S2 without gallops or murmurs.  Abdomen: Obese appearing.   Msk: Normal strength and tone for age. Extremities: Warm and well perfused. No clubbing, cyanosis.  No peripheral edema.  Neuro: Alert and oriented to self,, time, place, and situation. Psych:  Answers simple questions appropriately.  Labs: Basic Metabolic Panel: Recent Labs    05/14/22 0833  NA 141  K 4.4  CL 106  CO2 25  GLUCOSE 104*  BUN 14  CREATININE 0.60  CALCIUM 9.3  MG 2.1    Liver Function Tests: Recent Labs    05/14/22 0833  AST 22  ALT 24  ALKPHOS 53  BILITOT 1.1  PROT 6.8  ALBUMIN 3.9    No results for input(s): "LIPASE", "AMYLASE" in the last 72 hours. CBC: Recent Labs    05/14/22 0833  WBC 7.5  NEUTROABS 5.1  HGB 14.9  HCT 46.9*  MCV 97.3  PLT 220    Cardiac  Enzymes: Recent Labs    05/14/22 0833 05/14/22 1037  TROPONINIHS 5 5    BNP: Invalid input(s): "POCBNP" D-Dimer: No results for input(s): "DDIMER" in the last 72 hours. Hemoglobin A1C: No results for input(s): "HGBA1C" in the last 72 hours. Fasting Lipid Panel: No results for input(s): "CHOL", "HDL", "LDLCALC", "TRIG", "CHOLHDL", "LDLDIRECT" in the last 72 hours. Thyroid Function Tests: Recent Labs    05/14/22 0833  TSH 1.936   Anemia Panel: No results for input(s): "VITAMINB12", "FOLATE", "FERRITIN", "TIBC", "IRON", "RETICCTPCT" in the last 72 hours.  ECHOCARDIOGRAM COMPLETE  Result Date: 05/15/2022    ECHOCARDIOGRAM REPORT   Patient Name:   PUNEET SELDEN Date of Exam: 05/14/2022 Medical Rec #:  [id number]        Height:       65.0 in Accession #:    [id number]       Weight:       211.6 lb Date of Birth:  24-Dec-1935         BSA:          2.026 m Patient Age:    5 years         BP:           159/86 mmHg Patient Gender: F                HR:           72 bpm. Exam Location:  [facility] Procedure: 2D Echo, Cardiac Doppler and Color Doppler Indications:     R94.31 Abnormal EKG  History:         Patient has no prior history of Echocardiogram examinations.                  CHF; Risk Factors:Hypertension. Hypothyroidism.  Sonographer:     Cresenciano Lick RDCS Referring Phys:  [place] Diagnosing Phys: Isaias Cowman MD IMPRESSIONS  1. Left ventricular ejection fraction, by estimation, is 60 to 65%. The left ventricle has normal function. The left ventricle has no regional wall motion abnormalities. Left ventricular diastolic parameters were normal.  2. Right ventricular systolic function is normal. The right ventricular size is normal.  3. The mitral valve is normal in structure. Mild mitral valve regurgitation. No evidence of mitral stenosis.  4. The aortic valve is normal in structure. Aortic valve regurgitation is not visualized. No aortic stenosis is present.  5. The inferior  vena cava is normal in size with greater than 50% respiratory variability, suggesting right atrial pressure of 3 mmHg. FINDINGS  Left Ventricle: Left ventricular ejection fraction, by estimation, is 60 to 65%. The left ventricle has normal function. The left ventricle has no regional wall motion abnormalities. The left ventricular internal cavity size was normal in size. There is  no left ventricular hypertrophy. Left ventricular diastolic parameters were normal. Right Ventricle: The right ventricular size is normal. No increase in right ventricular wall thickness. Right ventricular systolic function is normal. Left Atrium: Left atrial size was normal in size. Right Atrium: Right atrial size was normal in size. Pericardium: There is no evidence of pericardial effusion. Mitral Valve: The mitral valve is normal in structure. Mild mitral valve regurgitation. No evidence of mitral valve stenosis. Tricuspid Valve: The tricuspid valve is normal in structure. Tricuspid valve regurgitation is mild . No evidence of tricuspid stenosis. Aortic Valve: The aortic valve is normal in structure. Aortic valve regurgitation is not visualized. No aortic stenosis is present. Pulmonic Valve: The pulmonic valve was normal in structure. Pulmonic valve regurgitation is not visualized. No evidence of pulmonic stenosis. Aorta: The aortic root is normal in size and structure. Venous: The inferior vena cava is normal in size with greater than 50% respiratory variability, suggesting right atrial pressure of 3 mmHg. IAS/Shunts: No atrial level shunt detected by color flow Doppler.  LEFT VENTRICLE PLAX 2D LVIDd:         4.10 cm   Diastology LVIDs:         2.30 cm   LV e' medial:    5.84 cm/s LV  PW:         1.00 cm   LV E/e' medial:  9.6 LV IVS:        1.00 cm   LV e' lateral:   8.08 cm/s LVOT diam:     2.00 cm   LV E/e' lateral: 7.0 LV SV:         82 LV SV Index:   41 LVOT Area:     3.14 cm  RIGHT VENTRICLE RV Basal diam:  3.00 cm RV S prime:      19.10 cm/s TAPSE (M-mode): 2.2 cm LEFT ATRIUM             Index        RIGHT ATRIUM          Index LA diam:        3.40 cm 1.68 cm/m   RA Area:     9.99 cm LA Vol (A2C):   36.8 ml 18.16 ml/m  RA Volume:   22.70 ml 11.20 ml/m LA Vol (A4C):   29.0 ml 14.31 ml/m LA Biplane Vol: 34.6 ml 17.08 ml/m  AORTIC VALVE LVOT Vmax:   123.50 cm/s LVOT Vmean:  78.750 cm/s LVOT VTI:    0.262 m  AORTA Ao Root diam: 3.10 cm Ao Asc diam:  3.90 cm MITRAL VALVE MV Area (PHT): 2.91 cm    SHUNTS MV Decel Time: 261 msec    Systemic VTI:  0.26 m MV E velocity: 56.20 cm/s  Systemic Diam: 2.00 cm MV A velocity: 79.50 cm/s MV E/A ratio:  0.71 Isaias Cowman MD Electronically signed by Isaias Cowman MD Signature Date/Time: 05/15/2022/7:59:44 AM    Final    DG Chest Portable 1 View  Result Date: 05/14/2022 CLINICAL DATA:  Shortness of breath.  Chest pain. EXAM: PORTABLE CHEST 1 VIEW COMPARISON:  None Available. FINDINGS: 0834 hours. Lungs are hyperexpanded. The cardio pericardial silhouette is enlarged. There is pulmonary vascular congestion without overt pulmonary edema. Streaky opacity at the left base suggest atelectasis. No overt airspace edema or pleural effusion. Telemetry leads overlie the chest. IMPRESSION: Enlargement of the cardiopericardial silhouette with pulmonary vascular congestion. Electronically Signed   By: Misty Stanley M.D.   On: 05/14/2022 08:43     Radiology: ECHOCARDIOGRAM COMPLETE  Result Date: 05/15/2022    ECHOCARDIOGRAM REPORT   Patient Name:   INGEBORG FITE Date of Exam: 05/14/2022 Medical Rec #:  [id number]        Height:       65.0 in Accession #:    [id number]       Weight:       211.6 lb Date of Birth:  1935/09/15         BSA:          2.026 m Patient Age:    32 years         BP:           159/86 mmHg Patient Gender: F                HR:           72 bpm. Exam Location:  [facility] Procedure: 2D Echo, Cardiac Doppler and Color Doppler Indications:     R94.31 Abnormal EKG  History:          Patient has no prior history of Echocardiogram examinations.                  CHF; Risk Factors:Hypertension. Hypothyroidism.  Sonographer:  Wilford Sports Rodgers-Jones RDCS Referring Phys:  [id number] [place] PATEL Diagnosing Phys: Isaias Cowman MD IMPRESSIONS  1. Left ventricular ejection fraction, by estimation, is 60 to 65%. The left ventricle has normal function. The left ventricle has no regional wall motion abnormalities. Left ventricular diastolic parameters were normal.  2. Right ventricular systolic function is normal. The right ventricular size is normal.  3. The mitral valve is normal in structure. Mild mitral valve regurgitation. No evidence of mitral stenosis.  4. The aortic valve is normal in structure. Aortic valve regurgitation is not visualized. No aortic stenosis is present.  5. The inferior vena cava is normal in size with greater than 50% respiratory variability, suggesting right atrial pressure of 3 mmHg. FINDINGS  Left Ventricle: Left ventricular ejection fraction, by estimation, is 60 to 65%. The left ventricle has normal function. The left ventricle has no regional wall motion abnormalities. The left ventricular internal cavity size was normal in size. There is  no left ventricular hypertrophy. Left ventricular diastolic parameters were normal. Right Ventricle: The right ventricular size is normal. No increase in right ventricular wall thickness. Right ventricular systolic function is normal. Left Atrium: Left atrial size was normal in size. Right Atrium: Right atrial size was normal in size. Pericardium: There is no evidence of pericardial effusion. Mitral Valve: The mitral valve is normal in structure. Mild mitral valve regurgitation. No evidence of mitral valve stenosis. Tricuspid Valve: The tricuspid valve is normal in structure. Tricuspid valve regurgitation is mild . No evidence of tricuspid stenosis. Aortic Valve: The aortic valve is normal in structure. Aortic valve regurgitation is not  visualized. No aortic stenosis is present. Pulmonic Valve: The pulmonic valve was normal in structure. Pulmonic valve regurgitation is not visualized. No evidence of pulmonic stenosis. Aorta: The aortic root is normal in size and structure. Venous: The inferior vena cava is normal in size with greater than 50% respiratory variability, suggesting right atrial pressure of 3 mmHg. IAS/Shunts: No atrial level shunt detected by color flow Doppler.  LEFT VENTRICLE PLAX 2D LVIDd:         4.10 cm   Diastology LVIDs:         2.30 cm   LV e' medial:    5.84 cm/s LV PW:         1.00 cm   LV E/e' medial:  9.6 LV IVS:        1.00 cm   LV e' lateral:   8.08 cm/s LVOT diam:     2.00 cm   LV E/e' lateral: 7.0 LV SV:         82 LV SV Index:   41 LVOT Area:     3.14 cm  RIGHT VENTRICLE RV Basal diam:  3.00 cm RV S prime:     19.10 cm/s TAPSE (M-mode): 2.2 cm LEFT ATRIUM             Index        RIGHT ATRIUM          Index LA diam:        3.40 cm 1.68 cm/m   RA Area:     9.99 cm LA Vol (A2C):   36.8 ml 18.16 ml/m  RA Volume:   22.70 ml 11.20 ml/m LA Vol (A4C):   29.0 ml 14.31 ml/m LA Biplane Vol: 34.6 ml 17.08 ml/m  AORTIC VALVE LVOT Vmax:   123.50 cm/s LVOT Vmean:  78.750 cm/s LVOT VTI:    0.262 m  AORTA Ao Root  diam: 3.10 cm Ao Asc diam:  3.90 cm MITRAL VALVE MV Area (PHT): 2.91 cm    SHUNTS MV Decel Time: 261 msec    Systemic VTI:  0.26 m MV E velocity: 56.20 cm/s  Systemic Diam: 2.00 cm MV A velocity: 79.50 cm/s MV E/A ratio:  0.71 Isaias Cowman MD Electronically signed by Isaias Cowman MD Signature Date/Time: 05/15/2022/7:59:44 AM    Final    DG Chest Portable 1 View  Result Date: 05/14/2022 CLINICAL DATA:  Shortness of breath.  Chest pain. EXAM: PORTABLE CHEST 1 VIEW COMPARISON:  None Available. FINDINGS: 0834 hours. Lungs are hyperexpanded. The cardio pericardial silhouette is enlarged. There is pulmonary vascular congestion without overt pulmonary edema. Streaky opacity at the left base suggest  atelectasis. No overt airspace edema or pleural effusion. Telemetry leads overlie the chest. IMPRESSION: Enlargement of the cardiopericardial silhouette with pulmonary vascular congestion. Electronically Signed   By: Misty Stanley M.D.   On: 05/14/2022 08:43   DG Tibia/Fibula Right  Result Date: 05/05/2022 CLINICAL DATA:  Lower leg pain after a fall. EXAM: RIGHT TIBIA AND FIBULA - 2 VIEW COMPARISON:  None Available. FINDINGS: There is no evidence of fracture or other focal bone lesions. Soft tissues are unremarkable. Degenerative spurring noted at the knee. IMPRESSION: Negative. Electronically Signed   By: Misty Stanley M.D.   On: 05/05/2022 10:09   DG Hip Unilat W or Wo Pelvis 2-3 Views Right  Result Date: 05/05/2022 CLINICAL DATA:  Right hip pain after fall. EXAM: DG HIP (WITH OR WITHOUT PELVIS) 2-3V RIGHT COMPARISON:  None Available. FINDINGS: There is no evidence of hip fracture or dislocation. Mild narrowing of the right hip joint is noted. IMPRESSION: Mild degenerative joint disease of right hip. No acute abnormality seen. Electronically Signed   By: Marijo Conception M.D.   On: 05/05/2022 09:05   CT Lumbar Spine Wo Contrast  Result Date: 05/05/2022 CLINICAL DATA:  86 year old female status post fall from wheelchair. EXAM: CT LUMBAR SPINE WITHOUT CONTRAST TECHNIQUE: Multidetector CT imaging of the lumbar spine was performed without intravenous contrast administration. Multiplanar CT image reconstructions were also generated. RADIATION DOSE REDUCTION: This exam was performed according to the departmental dose-optimization program which includes automated exposure control, adjustment of the mA and/or kV according to patient size and/or use of iterative reconstruction technique. COMPARISON:  Lumbar MRI 05/25/2021. FINDINGS: Segmentation: Normal, the same numbering system used on the MRI last year. Alignment: Stable. Mild chronic straightening of lumbar lordosis, mild chronic retrolisthesis of L1 on L2.  Vertebrae: Chronic, previously augmented moderate to severe L4 compression fracture. Chronic retropulsion of the posterosuperior endplate, and prior posterior decompression of that and the adjacent levels. Osteopenia. Stable visible lower thoracic and lumbar vertebral height elsewhere. Visible sacrum and SI joints appear intact. No acute osseous abnormality identified. Paraspinal and other soft tissues: Normal caliber abdominal aorta. Aortoiliac calcified atherosclerosis. Chronic postoperative changes to the lumbar paraspinal soft tissues. Disc levels: Superimposed lumbar spine degeneration appears stable since last year with mild chronic multifactorial spinal stenosis at L1-L2. IMPRESSION: 1. No acute traumatic injury identified in the lumbar spine. 2. Stable chronic L4 compression fracture with retropulsion, previous augmentation, and with chronic posterior decompression of that and the adjacent levels. 3. Aortic Atherosclerosis (ICD10-I70.0). Electronically Signed   By: Genevie Ann M.D.   On: 05/05/2022 08:59   CT Cervical Spine Wo Contrast  Result Date: 05/05/2022 CLINICAL DATA:  86 year old female status post fall from wheelchair. Parkinson's disease. EXAM: CT CERVICAL SPINE WITHOUT CONTRAST  TECHNIQUE: Multidetector CT imaging of the cervical spine was performed without intravenous contrast. Multiplanar CT image reconstructions were also generated. RADIATION DOSE REDUCTION: This exam was performed according to the departmental dose-optimization program which includes automated exposure control, adjustment of the mA and/or kV according to patient size and/or use of iterative reconstruction technique. COMPARISON:  09/25/2020. FINDINGS: Alignment: Stable straightening of cervical lordosis. Cervicothoracic junction alignment is within normal limits. Bilateral posterior element alignment is within normal limits. Skull base and vertebrae: Visualized skull base is intact. No atlanto-occipital dissociation. C1 and C2  appear intact and aligned. No acute osseous abnormality identified. Soft tissues and spinal canal: No prevertebral fluid or swelling. No visible canal hematoma. Chronic postoperative changes to the posterior paraspinal soft tissues. Stable visible noncontrast neck soft tissues, aside from an increased retropharyngeal course of the right carotid since last year. Disc levels: Stable. Chronic posterior spinal decompression from C3 through C7. Upper chest: Stable. IMPRESSION: No acute traumatic injury identified, stable CT appearance of the cervical spine with prior posterior spinal decompression from C3 through C7. Electronically Signed   By: Genevie Ann M.D.   On: 05/05/2022 08:54   CT Head Wo Contrast  Result Date: 05/05/2022 CLINICAL DATA:  86 year old female status post fall from wheelchair. Parkinson's disease. EXAM: CT HEAD WITHOUT CONTRAST TECHNIQUE: Contiguous axial images were obtained from the base of the skull through the vertex without intravenous contrast. RADIATION DOSE REDUCTION: This exam was performed according to the departmental dose-optimization program which includes automated exposure control, adjustment of the mA and/or kV according to patient size and/or use of iterative reconstruction technique. COMPARISON:  Brain MRI 10/01/2018.  Head CT 09/25/2020. FINDINGS: Brain: Stable cerebral volume. Stable ventricle size and configuration. No midline shift, mass effect, or evidence of intracranial mass lesion. No acute intracranial hemorrhage identified. Stable gray-white matter differentiation throughout the brain. No cortically based acute infarct identified. Vascular: Calcified atherosclerosis at the skull base. No suspicious intracranial vascular hyperdensity. Skull: No acute osseous abnormality identified. Sinuses/Orbits: Visualized paranasal sinuses and mastoids are clear. Other: No acute orbit or scalp soft tissue injury identified. IMPRESSION: No acute intracranial abnormality or acute traumatic  injury identified. Electronically Signed   By: Genevie Ann M.D.   On: 05/05/2022 08:51    04/05/2015 Impression   Myocardial perfusion imaging is Normal.   Summed severity score is normal.   Overall left ventricular systolic function was Normal without regional  wall motion abnormalities (see above).   No prior studies for comparison   Interpretation By:  Scarlett Presto, MD   Electronically signed April 05, 2015 at 7:56 PM Narrative  Procedure: Pharmacologic Myocardial Perfusion Imaging    one day procedure   Indication: Pre-operative clearance   Ordering Physician: Dr. Caryl Comes  Clinical History:  86 y.o. year old female  Vitals:Height: 40 in Weight: 187 lb  Breast/Chest Size: moderate  Cardiac risk factors include:  hypertension,  hypercholesterolemia/hyperlipidemia.  CAD History: Normal SE 5/12   Procedure:  Pharmacologic stress testing was performed with Regadenoson using a single  use 0.'4mg'$ /41m (0.08 mg/ml) prefilled syringe intravenously infused as a 10  second bolus dose. The stress test was stopped due to Infusion completion.   Blood pressure response was normal. The patient did not develop any  symptoms other than fatigue during the procedure.   Rest HR: 57bpm  Rest BP: 160/824mg  Max HR: 82bpm  Min BP: 140/8446m   ECG Interpretation:  Rest ECG:  normal sinus rhythm, none  Stress ECG:  sinus tachycardia  Recovery ECG:  normal sinus rhythm  ECG Interpretation:  non-diagnostic due to pharmacologic testing.  Stress Test Administered by: Leanor Rubenstein MD    Myocardial perfusion imaging was performed at rest 60 minutes following  the intravenous injection of 11 mCi of Tc-110mSestamibi in the right  antecubital .  10 - 20 seconds after the Lexiscan infusion, the patient  was intravenous injected with 11 mCi of Tc-972mestamibi followed by a 5  ml saline flush in the right antecubital.  Gated post-stress perfusion  imaging was performed 60 minutes after  stress.     Gated LV Analysis:  TID Ratio= 1.03  EDV= 61  ESV= 16  SV= 45  LVEF= 74%   FINDINGS:  Regional wall motion:  reveals normal myocardial thickening and wall  motion.  Artifacts noted:  Breast attenuation  Study quality: good  Left ventricular cavity: normal.   Perfusion Analysis:  SPECT images demonstrate homogeneous tracer  distribution throughout the myocardium.    ECHO LVEF 60-65%, mild MR  TELEMETRY reviewed by me (LT) 05/15/2022 : Sinus rhythm with rate 60s with periods of artifact.  EKG reviewed by me: sinus rhythm artifact, rate 63  Data reviewed by me (LT) 05/15/2022: Telemetry, vitals, troponin, TSH, discussed with hospitalist  ASSESSMENT AND PLAN:  MaBarton FannyPoWiens an [age]yoFith a PMH of ovarian cancer lumbar disc disease/spinal stenosis, hypertension, hypothyroidism, diverticulosis with h/o bowel perforation, osteoarthritis, Parkinson's disease [facility] [place] [facility] with indigestion and chest discomfort. She was reportedly in VT for less than 1 minute per EMS (maintained a pulse and was alert) and possibly converted to atrial flutter. Cardiology is consulted for further assistance.   #Atypical chest pain #Query atrial flutter versus artifact on telemetry #Parkinsonism  #Hypertension Patient presented with 8 out of 10 "chest pressure" that is actually more so in her epigastrium which she described as indigestion, self resolving without intervention.  Her troponins are negative on 2 repeats, BNP also negative.  There was a question regarding new onset atrial flutter on her EKGs and telemetry, but further review by myself and Dr. PaSaralyn Pilareveal that it is likely her tremor is creating artifact on the telemetry and EKGs that gives the appearance of flutter waves.  Her heart rate has been controlled in the 60s there has been no evidence of atrial fibrillation or heart block on telemetry thus far.  Her blood pressure has  been rather elevated which may be contributory to her chest discomfort earlier today.  The patient's daughter's main concern is her increased confusion and visual hallucinations over the past couple weeks. -Continuous monitoring on telemetry while inpatient -Continue losartan 50 mg once a day -Continue Coreg CR '10mg'$  once daily  -7 day Holter monitor for further evaluation of possible atrial flutter or other arrhythmias.  [facility] nursing staff placed this this morning. -Okay for discharge today from a cardiac standpoint with follow-up with Dr. PaSaralyn Pilarn 2 to 3 weeks.  This patient's plan of care was discussed and created with Dr. PaSaralyn Pilarnd he is in agreement.  Signed: LiTristan Schroeder PA-C 05/15/2022, 11:40 AM [facility]

## 2022-11-14 ENCOUNTER — Emergency Department
Admission: EM | Admit: 2022-11-14 | Discharge: 2022-11-14 | Disposition: A | Discharge status: Home / Self Care | Payer: Medicare Other | Attending: Emergency Medicine | Admitting: Emergency Medicine

## 2022-11-14 ENCOUNTER — Other Ambulatory Visit: Payer: Self-pay

## 2022-11-14 ENCOUNTER — Emergency Department: Payer: Medicare Other

## 2022-11-14 DIAGNOSIS — G20C Parkinsonism, unspecified: Secondary | ICD-10-CM | POA: Insufficient documentation

## 2022-11-14 DIAGNOSIS — I509 Heart failure, unspecified: Secondary | ICD-10-CM | POA: Diagnosis not present

## 2022-11-14 DIAGNOSIS — Z8543 Personal history of malignant neoplasm of ovary: Secondary | ICD-10-CM | POA: Diagnosis not present

## 2022-11-14 DIAGNOSIS — R0602 Shortness of breath: Secondary | ICD-10-CM | POA: Insufficient documentation

## 2022-11-14 DIAGNOSIS — Z1152 Encounter for screening for COVID-19: Secondary | ICD-10-CM | POA: Insufficient documentation

## 2022-11-14 DIAGNOSIS — I110 Hypertensive heart disease with heart failure: Secondary | ICD-10-CM | POA: Diagnosis not present

## 2022-11-14 HISTORY — DX: Parkinson's disease without dyskinesia, without mention of fluctuations: G20.A1

## 2022-11-14 LAB — CBC WITH DIFFERENTIAL/PLATELET
Abs Immature Granulocytes: 0.02 10*3/uL (ref 0.00–0.07)
Basophils Absolute: 0.1 10*3/uL (ref 0.0–0.1)
Basophils Relative: 1 %
Eosinophils Absolute: 0.2 10*3/uL (ref 0.0–0.5)
Eosinophils Relative: 3 %
HCT: 53.3 % — ABNORMAL HIGH (ref 36.0–46.0)
Hemoglobin: 16.8 g/dL — ABNORMAL HIGH (ref 12.0–15.0)
Immature Granulocytes: 0 %
Lymphocytes Relative: 18 %
Lymphs Abs: 1.2 10*3/uL (ref 0.7–4.0)
MCH: 30.4 pg (ref 26.0–34.0)
MCHC: 31.5 g/dL (ref 30.0–36.0)
MCV: 96.4 fL (ref 80.0–100.0)
Monocytes Absolute: 0.6 10*3/uL (ref 0.1–1.0)
Monocytes Relative: 9 %
Neutro Abs: 4.8 10*3/uL (ref 1.7–7.7)
Neutrophils Relative %: 69 %
Platelets: 253 10*3/uL (ref 150–400)
RBC: 5.53 MIL/uL — ABNORMAL HIGH (ref 3.87–5.11)
RDW: 14.6 % (ref 11.5–15.5)
WBC: 6.8 10*3/uL (ref 4.0–10.5)
nRBC: 0 % (ref 0.0–0.2)

## 2022-11-14 LAB — COMPREHENSIVE METABOLIC PANEL
ALT: 16 U/L (ref 0–44)
AST: 24 U/L (ref 15–41)
Albumin: 4.1 g/dL (ref 3.5–5.0)
Alkaline Phosphatase: 76 U/L (ref 38–126)
Anion gap: 10 (ref 5–15)
BUN: 10 mg/dL (ref 8–23)
CO2: 25 mmol/L (ref 22–32)
Calcium: 9.9 mg/dL (ref 8.9–10.3)
Chloride: 106 mmol/L (ref 98–111)
Creatinine, Ser: 0.62 mg/dL (ref 0.44–1.00)
GFR, Estimated: >60 mL/min (ref >60)
Glucose, Bld: 97 mg/dL (ref 70–99)
Potassium: 4.8 mmol/L (ref 3.5–5.1)
Sodium: 141 mmol/L (ref 135–145)
Total Bilirubin: 1.3 mg/dL — ABNORMAL HIGH (ref 0.3–1.2)
Total Protein: 7.2 g/dL (ref 6.5–8.1)

## 2022-11-14 LAB — TROPONIN I (HIGH SENSITIVITY)
Troponin I (High Sensitivity): 2 ng/L (ref <18)
Troponin I (High Sensitivity): 3 ng/L (ref <18)

## 2022-11-14 LAB — URINALYSIS, ROUTINE W REFLEX MICROSCOPIC
Bilirubin Urine: NEGATIVE
Glucose, UA: NEGATIVE mg/dL
Hgb urine dipstick: NEGATIVE
Ketones, ur: NEGATIVE mg/dL
Nitrite: NEGATIVE
Protein, ur: NEGATIVE mg/dL
Specific Gravity, Urine: 1.016 (ref 1.005–1.030)
pH: 8 (ref 5.0–8.0)

## 2022-11-14 LAB — RESP PANEL BY RT-PCR (RSV, FLU A&B, COVID)  RVPGX2
Influenza A by PCR: NEGATIVE
Influenza B by PCR: NEGATIVE
Resp Syncytial Virus by PCR: NEGATIVE
SARS Coronavirus 2 by RT PCR: NEGATIVE

## 2022-11-14 LAB — BRAIN NATRIURETIC PEPTIDE: B Natriuretic Peptide: 32.1 pg/mL (ref 0.0–100.0)

## 2022-11-14 LAB — D-DIMER, QUANTITATIVE: D-Dimer, Quant: 0.67 ug/mL-FEU — ABNORMAL HIGH (ref 0.00–0.50)

## 2022-11-14 MED ORDER — IOHEXOL 350 MG/ML SOLN
75.0000 mL | Freq: Once | INTRAVENOUS | Status: AC | PRN
  Administered 2022-11-14: 75 mL via INTRAVENOUS

## 2022-11-14 NOTE — Discharge Instructions (Signed)
Workup was reassuring without evidence of heart attack or blood clots return to the ER symptoms worsening symptoms or any other concerns

## 2022-11-14 NOTE — ED Provider Notes (Signed)
[facility] Provider Note    Event Date/Time   First MD Initiated Contact with Patient 11/14/22 1145     (approximate)   History   Shortness of Breath   HPI  Susan Salas is a 87 y.o. female who is wheelchair-bound at baseline, history of Parkinson's, hypertension, CHF, low-grade serous adenocarcinoma of ovary who comes in with concerns for shortness of breath.  Patient comes in from home [facility] [place] with concerns for intermittent shortness of breath.  Patient reports shortness of breath started around 11 AM.  She states that the shortness of breath to started while she was watching TV.  She denies any history of shortness of breath or lung problems previously.  She said that the shortness of breath seems to be getting better.  She denies any chest pain, little worsening leg swelling or any other concerns.  She denies any falls or hitting her head.  Patient does report being more stressed recently given her husband is currently in a rehab facility and has been hard with him not being at their home [facility] [place] together.   Physical Exam   Triage Vital Signs: ED Triage Vitals  Enc Vitals Group     BP 11/14/22 1152 (!) 152/81     Pulse Rate 11/14/22 1152 62     Resp 11/14/22 1152 18     Temp 11/14/22 1152 97.6 F (36.4 C)     Temp Source 11/14/22 1152 Oral     SpO2 11/14/22 1152 100 %     Weight 11/14/22 1154 213 lb 6.5 oz (96.8 kg)     Height 11/14/22 1151 '5\' 5"'$  (1.651 m)     Head Circumference --      Peak Flow --      Pain Score --      Pain Loc --      Pain Edu? --      Excl. in [place]? --     Most recent vital signs: Vitals:   11/14/22 1152  BP: (!) 152/81  Pulse: 62  Resp: 18  Temp: 97.6 F (36.4 C)  SpO2: 100%     General: Awake, no distress.  CV:  Good peripheral perfusion.  Resp:  Normal effort.  Clear lungs Abd:  No distention.  Soft nontender Other:  Baseline legs no worsening swelling than baseline per  patient Some tremor noted but she reports that is baseline from Parkinson's  ED Results / Procedures / Treatments   Labs (all labs ordered are listed, but only abnormal results are displayed) Labs Reviewed  RESP PANEL BY RT-PCR (RSV, FLU A&B, COVID)  [id number]  CBC WITH DIFFERENTIAL/PLATELET  COMPREHENSIVE METABOLIC PANEL  D-DIMER, QUANTITATIVE  URINALYSIS, ROUTINE W REFLEX MICROSCOPIC  BRAIN NATRIURETIC PEPTIDE  TROPONIN I (HIGH SENSITIVITY)     EKG  My interpretation of EKG:  Normal sinus rate of 65 without any ST elevation or T wave inversions, normal intervals  RADIOLOGY I have reviewed the xray personally and interpreted and no pneumonia    PROCEDURES:  Critical Care performed: No  .1-3 Lead EKG Interpretation  Performed by: Vanessa Manchester, MD Authorized by: Vanessa [place], MD     Interpretation: normal     ECG rate:  60   ECG rate assessment: normal     Rhythm: sinus rhythm     Ectopy: none     Conduction: normal      MEDICATIONS ORDERED IN ED: Medications  iohexol (OMNIPAQUE) 350 MG/ML  injection 75 mL (75 mLs Intravenous Contrast Given 11/14/22 1400)     IMPRESSION / MDM / ASSESSMENT AND PLAN / ED COURSE  I reviewed the triage vital signs and the nursing notes.   Patient's presentation is most consistent with acute presentation with potential threat to life or bodily function.   Differential includes ACS, COVID, flu, PE.  No obvious signs of DVT on examination we will proceed with D-dimer given overall low risk and symptoms seem to be resolving but given her history of cancer reasonable to check d-dimer.  Patient's daughter is now at bedside he does report that she has a lot of stress and anxiety related to the husband not being there and they think that it is more likely related to that but they are okay with proceeding with workup to rule out anything else of causing the shortness of breath.  X-ray was reassuring CT PE negative.  Troponins were negative x  2.  Urine with some slight WBCs in it but also some squamous cells denies any urinary symptoms.  She is afebrile at normal baseline mental status normal white count discussed with daughter and would prefer to hold off on antibiotics and agree with this plan given no signs of symptomatic UTI.  CBC reassuring CMP reassuring D-dimer was slightly elevated  Considered admission patient has been well-appearing with normal oxygen levels and feels comfortable discharge home.  They will follow-up with her primary care doctor to discuss anxiety medication.  The patient is on the cardiac monitor to evaluate for evidence of arrhythmia and/or significant heart rate changes.      FINAL CLINICAL IMPRESSION(S) / ED DIAGNOSES   Final diagnoses:  SOB (shortness of breath)     Rx / DC Orders   ED Discharge Orders     None        Note:  This document was prepared using Dragon voice recognition software and may include unintentional dictation errors.   Vanessa Bellaire, MD 11/14/22 [phone]

## 2022-11-14 NOTE — ED Triage Notes (Signed)
EMS states called out to [place] for intermittent SOB.

## 2023-08-31 ENCOUNTER — Emergency Department (HOSPITAL_COMMUNITY): Payer: Medicare Other

## 2023-08-31 ENCOUNTER — Encounter (HOSPITAL_COMMUNITY): Payer: Self-pay

## 2023-08-31 ENCOUNTER — Emergency Department (HOSPITAL_COMMUNITY)
Admission: EM | Admit: 2023-08-31 | Discharge: 2023-09-01 | Disposition: A | Discharge status: Skilled Nursing Facility | Payer: Medicare Other | Attending: Emergency Medicine | Admitting: Emergency Medicine

## 2023-08-31 ENCOUNTER — Other Ambulatory Visit: Payer: Self-pay

## 2023-08-31 DIAGNOSIS — I10 Essential (primary) hypertension: Secondary | ICD-10-CM | POA: Insufficient documentation

## 2023-08-31 DIAGNOSIS — F0390 Unspecified dementia without behavioral disturbance: Secondary | ICD-10-CM | POA: Insufficient documentation

## 2023-08-31 DIAGNOSIS — W050XXA Fall from non-moving wheelchair, initial encounter: Secondary | ICD-10-CM | POA: Diagnosis not present

## 2023-08-31 DIAGNOSIS — S0003XA Contusion of scalp, initial encounter: Secondary | ICD-10-CM | POA: Diagnosis not present

## 2023-08-31 DIAGNOSIS — Z7982 Long term (current) use of aspirin: Secondary | ICD-10-CM | POA: Insufficient documentation

## 2023-08-31 DIAGNOSIS — S0990XA Unspecified injury of head, initial encounter: Secondary | ICD-10-CM | POA: Diagnosis present

## 2023-08-31 DIAGNOSIS — W19XXXA Unspecified fall, initial encounter: Secondary | ICD-10-CM

## 2023-08-31 MED ORDER — ACETAMINOPHEN 325 MG PO TABS
650.0000 mg | ORAL_TABLET | Freq: Once | ORAL | Status: AC
  Administered 2023-08-31: 650 mg via ORAL
  Filled 2023-08-31: qty 2

## 2023-08-31 NOTE — Discharge Instructions (Signed)
As we discussed, your CT scan did not show any fractures.  You likely have a small hematoma of your scalp.  Take Tylenol as needed for pain  See your doctor for follow-up  Return to ER if you have another fall, headache, vomiting

## 2023-08-31 NOTE — ED Provider Notes (Signed)
[place] EMERGENCY DEPARTMENT AT [facility] Provider Note   CSN: [id number] Arrival date & time: 08/31/23  1949     History  Chief Complaint  Patient presents with   Susan Salas is a 87 y.o. female history of hypertension, dementia, here presenting with a fall.  Patient is demented and from [facility].  Patient has been hallucinating recently.  Per the son, her husband died recently and she sometimes would be looking for him.  Patient has been slightly more confused today.  She was in the recliner and fell and hit her head.  Son is at bedside and just wants her checked for any bleeding.  Patient is DNR.  He does not want any lab work.  The history is provided by the patient.       Home Medications Prior to Admission medications   Medication Sig Start Date End Date Taking? Authorizing Provider  aspirin 81 MG chewable tablet Take 81 mg by mouth daily.    [provider]  baclofen (LIORESAL) 10 MG tablet Take 5 mg by mouth 2 (two) times daily.    [provider]  carvedilol (COREG CR) 10 MG 24 hr capsule Take 10 mg by mouth daily.    [provider]  denosumab (PROLIA) 60 MG/ML SOSY injection Inject 60 mg into the skin every 6 (six) months.    [provider]  DULoxetine (CYMBALTA) 30 MG capsule Take 60 mg by mouth daily.    [provider]  furosemide (LASIX) 40 MG tablet Take 40 mg by mouth daily as needed for edema.    [provider]  gabapentin (NEURONTIN) 300 MG capsule Take 2 capsules (600 mg total) by mouth 3 (three) times daily. 03/06/21   Pennie Banter, DO  levothyroxine (SYNTHROID) 88 MCG tablet Take 1 tablet (88 mcg total) by mouth daily before breakfast. 03/07/21   Esaw Grandchild A, DO  losartan (COZAAR) 50 MG tablet Take 1 tablet (50 mg total) by mouth daily. 05/16/22   Hollice Espy, MD  rOPINIRole (REQUIP) 0.25 MG tablet Take 0.25 mg by mouth daily.    [provider]   senna (SENOKOT) 8.6 MG tablet Take 8.6 mg by mouth 2 (two) times daily.    [provider]  Vitamin D, Ergocalciferol, (DRISDOL) 1.25 MG (50000 UNIT) CAPS capsule Take 50,000 Units by mouth every Sunday.    [provider]  vitamin E 1000 UNIT capsule Take 1,000 Units by mouth daily.    [provider]      Allergies    Other, Statins, Sulfa antibiotics, Sulfasalazine, Ibandronic acid, Sertraline, Hydrochlorothiazide, Lidocaine, Morphine, Ramipril, and Tape    Review of Systems   Review of Systems  Psychiatric/Behavioral:  Positive for confusion.   All other systems reviewed and are negative.   Physical Exam Updated Vital Signs BP (!) 165/111 (BP Location: Right Arm)   Pulse 66   Temp 97.8 F (36.6 C) (Oral)   Resp 20   Ht 5\' 5"  (1.651 m)   Wt 95.9 kg   SpO2 92%   BMI 35.18 kg/m  Physical Exam Vitals and nursing note reviewed.  Constitutional:      Comments: Slightly confused  HENT:     Head: Normocephalic.     Comments: Questionable posterior scalp hematoma    Nose: Nose normal.     Mouth/Throat:     Mouth: Mucous membranes are moist.  Eyes:     Extraocular  Movements: Extraocular movements intact.     Pupils: Pupils are equal, round, and reactive to light.  Cardiovascular:     Rate and Rhythm: Normal rate and regular rhythm.     Pulses: Normal pulses.     Heart sounds: Normal heart sounds.  Pulmonary:     Effort: Pulmonary effort is normal.     Breath sounds: Normal breath sounds.  Abdominal:     General: Abdomen is flat.     Palpations: Abdomen is soft.  Musculoskeletal:     Cervical back: Normal range of motion and neck supple.     Comments: No spinal tenderness.  No signs of extremity trauma.  Normal range of motion bilateral hips  Skin:    General: Skin is warm.     Capillary Refill: Capillary refill takes less than 2 seconds.  Neurological:     Comments: Demented and ANO x 1.  Moving all extremities.  Psychiatric:         Mood and Affect: Mood normal.     ED Results / Procedures / Treatments   Labs (all labs ordered are listed, but only abnormal results are displayed) Labs Reviewed - No data to display  EKG None  Radiology No results found.  Procedures Procedures    Medications Ordered in ED Medications  acetaminophen (TYLENOL) tablet 650 mg (has no administration in time range)    ED Course/ Medical Decision Making/ A&P                                 Medical Decision Making Susan Salas is a 87 y.o. female here presenting with confusion and fall.  Will get CT head to rule out a bleed.  Family wants to hold off on lab work currently.  9:35 PM I reviewed patient's CT scans and they were unremarkable.  X-rays are unremarkable.  Stable for discharge.  Problems Addressed: Fall, initial encounter: acute illness or injury Hematoma of scalp, initial encounter: acute illness or injury  Amount and/or Complexity of Data Reviewed Radiology: ordered and independent interpretation performed. Decision-making details documented in ED Course.  Risk OTC drugs.    Final Clinical Impression(s) / ED Diagnoses Final diagnoses:  None    Rx / DC Orders ED Discharge Orders     None         Charlynne Pander, MD 08/31/23 2136

## 2023-08-31 NOTE — ED Triage Notes (Signed)
Pt arrived from [facility] via [facility] s/p fall from wc, unwitnessed. Pt alert and oriented x 3. Person, place, and situation. Pt states that she did not hit her head and that nothing hurts. Per facility, even though fall was unwitnessed per EMS facility states that she slid out of wc and hit head on a popcorn tin

## 2023-11-12 ENCOUNTER — Other Ambulatory Visit: Payer: Self-pay

## 2023-11-12 ENCOUNTER — Observation Stay (HOSPITAL_COMMUNITY)

## 2023-11-12 ENCOUNTER — Emergency Department (HOSPITAL_COMMUNITY)

## 2023-11-12 ENCOUNTER — Encounter (HOSPITAL_COMMUNITY): Payer: Self-pay

## 2023-11-12 ENCOUNTER — Observation Stay (HOSPITAL_COMMUNITY)
Admission: EM | Admit: 2023-11-12 | Discharge: 2023-11-13 | Disposition: A | Discharge status: Skilled Nursing Facility | Attending: Family Medicine | Admitting: Family Medicine

## 2023-11-12 DIAGNOSIS — Z853 Personal history of malignant neoplasm of breast: Secondary | ICD-10-CM | POA: Diagnosis not present

## 2023-11-12 DIAGNOSIS — I10 Essential (primary) hypertension: Secondary | ICD-10-CM | POA: Diagnosis present

## 2023-11-12 DIAGNOSIS — G20C Parkinsonism, unspecified: Secondary | ICD-10-CM | POA: Diagnosis not present

## 2023-11-12 DIAGNOSIS — Z8673 Personal history of transient ischemic attack (TIA), and cerebral infarction without residual deficits: Principal | ICD-10-CM | POA: Insufficient documentation

## 2023-11-12 DIAGNOSIS — Z7902 Long term (current) use of antithrombotics/antiplatelets: Secondary | ICD-10-CM | POA: Insufficient documentation

## 2023-11-12 DIAGNOSIS — I5032 Chronic diastolic (congestive) heart failure: Secondary | ICD-10-CM | POA: Diagnosis present

## 2023-11-12 DIAGNOSIS — I110 Hypertensive heart disease with heart failure: Secondary | ICD-10-CM | POA: Insufficient documentation

## 2023-11-12 DIAGNOSIS — G609 Hereditary and idiopathic neuropathy, unspecified: Secondary | ICD-10-CM | POA: Insufficient documentation

## 2023-11-12 DIAGNOSIS — Z79899 Other long term (current) drug therapy: Secondary | ICD-10-CM | POA: Insufficient documentation

## 2023-11-12 DIAGNOSIS — I2510 Atherosclerotic heart disease of native coronary artery without angina pectoris: Secondary | ICD-10-CM | POA: Diagnosis not present

## 2023-11-12 DIAGNOSIS — R29810 Facial weakness: Secondary | ICD-10-CM | POA: Insufficient documentation

## 2023-11-12 DIAGNOSIS — G4733 Obstructive sleep apnea (adult) (pediatric): Secondary | ICD-10-CM | POA: Diagnosis present

## 2023-11-12 DIAGNOSIS — G3184 Mild cognitive impairment, so stated: Secondary | ICD-10-CM | POA: Insufficient documentation

## 2023-11-12 DIAGNOSIS — E039 Hypothyroidism, unspecified: Secondary | ICD-10-CM | POA: Diagnosis present

## 2023-11-12 DIAGNOSIS — Z6835 Body mass index (BMI) 35.0-35.9, adult: Secondary | ICD-10-CM | POA: Insufficient documentation

## 2023-11-12 DIAGNOSIS — Z8543 Personal history of malignant neoplasm of ovary: Secondary | ICD-10-CM | POA: Insufficient documentation

## 2023-11-12 DIAGNOSIS — H5712 Ocular pain, left eye: Secondary | ICD-10-CM | POA: Diagnosis not present

## 2023-11-12 DIAGNOSIS — R29818 Other symptoms and signs involving the nervous system: Secondary | ICD-10-CM | POA: Diagnosis not present

## 2023-11-12 DIAGNOSIS — Z7982 Long term (current) use of aspirin: Secondary | ICD-10-CM | POA: Insufficient documentation

## 2023-11-12 DIAGNOSIS — G2581 Restless legs syndrome: Secondary | ICD-10-CM | POA: Insufficient documentation

## 2023-11-12 DIAGNOSIS — Z87891 Personal history of nicotine dependence: Secondary | ICD-10-CM | POA: Diagnosis not present

## 2023-11-12 DIAGNOSIS — G459 Transient cerebral ischemic attack, unspecified: Principal | ICD-10-CM

## 2023-11-12 LAB — CBC
HCT: 49.3 % — ABNORMAL HIGH (ref 36.0–46.0)
Hemoglobin: 15.8 g/dL — ABNORMAL HIGH (ref 12.0–15.0)
MCH: 31.2 pg (ref 26.0–34.0)
MCHC: 32 g/dL (ref 30.0–36.0)
MCV: 97.4 fL (ref 80.0–100.0)
Platelets: 233 10*3/uL (ref 150–400)
RBC: 5.06 MIL/uL (ref 3.87–5.11)
RDW: 14.7 % (ref 11.5–15.5)
WBC: 8 10*3/uL (ref 4.0–10.5)
nRBC: 0 % (ref 0.0–0.2)

## 2023-11-12 LAB — COMPREHENSIVE METABOLIC PANEL
ALT: 20 U/L (ref 0–44)
AST: 24 U/L (ref 15–41)
Albumin: 3.5 g/dL (ref 3.5–5.0)
Alkaline Phosphatase: 44 U/L (ref 38–126)
Anion gap: 13 (ref 5–15)
BUN: 13 mg/dL (ref 8–23)
CO2: 25 mmol/L (ref 22–32)
Calcium: 9.3 mg/dL (ref 8.9–10.3)
Chloride: 104 mmol/L (ref 98–111)
Creatinine, Ser: 0.84 mg/dL (ref 0.44–1.00)
GFR, Estimated: >60 mL/min (ref >60)
Glucose, Bld: 104 mg/dL — ABNORMAL HIGH (ref 70–99)
Potassium: 4.1 mmol/L (ref 3.5–5.1)
Sodium: 142 mmol/L (ref 135–145)
Total Bilirubin: 1.1 mg/dL (ref 0.0–1.2)
Total Protein: 6.2 g/dL — ABNORMAL LOW (ref 6.5–8.1)

## 2023-11-12 LAB — DIFFERENTIAL
Abs Immature Granulocytes: 0.03 10*3/uL (ref 0.00–0.07)
Basophils Absolute: 0.1 10*3/uL (ref 0.0–0.1)
Basophils Relative: 1 %
Eosinophils Absolute: 0.3 10*3/uL (ref 0.0–0.5)
Eosinophils Relative: 3 %
Immature Granulocytes: 0 %
Lymphocytes Relative: 16 %
Lymphs Abs: 1.3 10*3/uL (ref 0.7–4.0)
Monocytes Absolute: 0.7 10*3/uL (ref 0.1–1.0)
Monocytes Relative: 9 %
Neutro Abs: 5.6 10*3/uL (ref 1.7–7.7)
Neutrophils Relative %: 71 %

## 2023-11-12 LAB — I-STAT CHEM 8, ED
BUN: 16 mg/dL (ref 8–23)
Calcium, Ion: 1.14 mmol/L — ABNORMAL LOW (ref 1.15–1.40)
Chloride: 104 mmol/L (ref 98–111)
Creatinine, Ser: 0.8 mg/dL (ref 0.44–1.00)
Glucose, Bld: 101 mg/dL — ABNORMAL HIGH (ref 70–99)
HCT: 47 % — ABNORMAL HIGH (ref 36.0–46.0)
Hemoglobin: 16 g/dL — ABNORMAL HIGH (ref 12.0–15.0)
Potassium: 4.1 mmol/L (ref 3.5–5.1)
Sodium: 141 mmol/L (ref 135–145)
TCO2: 29 mmol/L (ref 22–32)

## 2023-11-12 LAB — HEMOGLOBIN A1C
Hgb A1c MFr Bld: 5.3 % (ref 4.8–5.6)
Mean Plasma Glucose: 105.41 mg/dL

## 2023-11-12 LAB — APTT: aPTT: 26 s (ref 24–36)

## 2023-11-12 LAB — PROTIME-INR
INR: 1 (ref 0.8–1.2)
Prothrombin Time: 13.4 s (ref 11.4–15.2)

## 2023-11-12 LAB — ETHANOL: Alcohol, Ethyl (B): <10 mg/dL (ref <10)

## 2023-11-12 MED ORDER — LEVOTHYROXINE SODIUM 88 MCG PO TABS
88.0000 ug | ORAL_TABLET | Freq: Every day | ORAL | Status: DC
  Administered 2023-11-13: 88 ug via ORAL
  Filled 2023-11-12: qty 1

## 2023-11-12 MED ORDER — CLOPIDOGREL BISULFATE 75 MG PO TABS
75.0000 mg | ORAL_TABLET | Freq: Every day | ORAL | Status: DC
  Administered 2023-11-13: 75 mg via ORAL
  Filled 2023-11-12: qty 1

## 2023-11-12 MED ORDER — ROPINIROLE HCL 0.25 MG PO TABS
0.2500 mg | ORAL_TABLET | Freq: Every day | ORAL | Status: DC
  Administered 2023-11-13: 0.25 mg via ORAL
  Filled 2023-11-12: qty 1

## 2023-11-12 MED ORDER — STROKE: EARLY STAGES OF RECOVERY BOOK
Freq: Once | Status: DC
  Filled 2023-11-12: qty 1

## 2023-11-12 MED ORDER — ACETAMINOPHEN 500 MG PO TABS: 1000.0000 mg | ORAL_TABLET | Freq: Three times a day (TID) | ORAL | Status: DC | PRN

## 2023-11-12 MED ORDER — ACETAMINOPHEN 500 MG PO TABS
1000.0000 mg | ORAL_TABLET | Freq: Three times a day (TID) | ORAL | Status: DC | PRN
Start: 2023-11-12 — End: 2023-11-13
  Administered 2023-11-13: 1000 mg via ORAL
  Filled 2023-11-12: qty 2

## 2023-11-12 MED ORDER — IOHEXOL 350 MG/ML SOLN
75.0000 mL | Freq: Once | INTRAVENOUS | Status: AC | PRN
  Administered 2023-11-12: 75 mL via INTRAVENOUS

## 2023-11-12 MED ORDER — GABAPENTIN 300 MG PO CAPS
600.0000 mg | ORAL_CAPSULE | Freq: Three times a day (TID) | ORAL | Status: DC
  Administered 2023-11-13 (×2): 600 mg via ORAL
  Filled 2023-11-12 (×2): qty 2

## 2023-11-12 MED ORDER — DULOXETINE HCL 60 MG PO CPEP
60.0000 mg | ORAL_CAPSULE | Freq: Every day | ORAL | Status: DC
  Administered 2023-11-13: 60 mg via ORAL
  Filled 2023-11-12: qty 1

## 2023-11-12 MED ORDER — ASPIRIN 81 MG PO CHEW
81.0000 mg | CHEWABLE_TABLET | Freq: Every day | ORAL | Status: DC
  Administered 2023-11-13: 81 mg via ORAL
  Filled 2023-11-12: qty 1

## 2023-11-12 NOTE — H&P (Signed)
 History and Physical   IVALEE STRAUSER ZOX:[id number] DOB: 1935/12/20 DOA: 11/12/2023  PCP: Lynnea Ferrier, MD   Patient coming from: [facility]  Chief Complaint: Code stroke, Facial Droop  HPI: ELYA TARQUINIO is a 88 y.o. female with medical history significant of hypertension, hyperlipidemia, hypothyroidism, chronic diastolic HF, CAD, neuropathy, obesity, OSA, parkinsonism, ovarian cancer, breast cancer, SBO, bowel perforation, chronic pain presenting as a code stroke from rehab facility.  History obtained with assistance of chart review.  Patient was noted by staff at [facility] to have left facial droop and was reporting pain behind her left eye.  They also noted that she seemed to have a worsening tremor from her baseline (which she has in the setting of parkinsonism).  Last known normal was 2 PM.  Patient has diffuse headache.  EMS noted some abnormal pulse ox reading and patient was placed on 2 L of oxygen.  No obvious hypoxia in the ED.  Patient does not use oxygen at baseline.  At baseline she does require 1 person to assist her to ambulate.  She denies fevers, chills, chest pain, short of breath, abdominal pain, constipation, diarrhea, nausea, vomiting.  She reports her right lower extremity weakness is chronic.  Son reports over the last several weeks he noticed some worsening delirium and confusion/possible hallucinations.  ED Course: Vitals in the ED notable for blood pressure in the 120s to 210 systolic.  Lab workup included CMP with glucose 104, protein 6.2.  CBC with hemoglobin 15.8.  PT, PTT, INR within normal notes.  Ethanol level negative.  A1c normal.  Lipid panel pending.  CT head without acute normality.  CTA head and neck and MRI brain pending.  Neurology consulted and recommended TIA/CVA workup.  Patient received Plavix in the ED.  Review of Systems: As per HPI otherwise all other systems reviewed and are negative.  Past Medical History:  Diagnosis Date    Cancer (HCC) 2017   ovarian   CHF (congestive heart failure) (HCC)    Complication of anesthesia    hard to wake from general anesthesia, long porcedures nausea and vomiting.   Hypertension    Hypothyroidism    Parkinson disease (HCC)    PONV (postoperative nausea and vomiting)    Spinal headache    with labor of 66 year old son.    Past Surgical History:  Procedure Laterality Date   ABDOMINAL HYSTERECTOMY     AMPUTATION TOE Left 05/02/2017   Procedure: AMPUTATION TOE/Left 4th [id number]/[id number];  Surgeon: Linus Galas, DPM;  Location: [facility] ORS;  Service: Podiatry;  Laterality: Left;   APPENDECTOMY     BACK SURGERY  2014   lamenectomy   BREAST SURGERY Bilateral 2003   reduction   CATARACT EXTRACTION W/ INTRAOCULAR LENS IMPLANT Bilateral 2014   CHOLECYSTECTOMY     COLON SURGERY  2017   FRACTURE SURGERY Right 2003   screws & plates   KYPHOPLASTY N/A 02/26/2021   Procedure: L4 KYPHOPLASTY;  Surgeon: Kennedy Bucker, MD;  Location: [facility] ORS;  Service: Orthopedics;  Laterality: N/A;   TONSILLECTOMY      Social History  reports that she quit smoking about 40 years ago. Her smoking use included cigarettes. She has never used smokeless tobacco. She reports that she does not drink alcohol and does not use drugs.  Allergies  Allergen Reactions   Other Hives and Rash    "filler in all generic drugs"   Dissolvable stitches-do not dissolve   Statins Other (See  Comments)    Muscle weakness Other reaction(s): Other (See Comments) Other reaction(s): Other (See Comments) Muscle weakness Muscle weakness Muscle weakness    Sulfa Antibiotics Shortness Of Breath   Sulfasalazine Shortness Of Breath   Ibandronate Other (See Comments)    (BONIVA) Muscle Pain   Sertraline     Other reaction(s): UNKNOWN   Hydrochlorothiazide Rash   Lidocaine Rash   Morphine Rash   Ramipril Rash   Tape Rash    Pt can tolerate paper tape.    History reviewed. No pertinent family history.   Prior to  Admission medications   Medication Sig Start Date End Date Taking? Authorizing Provider  aspirin 81 MG chewable tablet Take 81 mg by mouth daily.    [provider]  baclofen (LIORESAL) 10 MG tablet Take 5 mg by mouth 2 (two) times daily.    [provider]  carvedilol (COREG CR) 10 MG 24 hr capsule Take 10 mg by mouth daily.    [provider]  denosumab (PROLIA) 60 MG/ML SOSY injection Inject 60 mg into the skin every 6 (six) months.    [provider]  DULoxetine (CYMBALTA) 30 MG capsule Take 60 mg by mouth daily.    [provider]  furosemide (LASIX) 40 MG tablet Take 40 mg by mouth daily as needed for edema.    [provider]  gabapentin (NEURONTIN) 300 MG capsule Take 2 capsules (600 mg total) by mouth 3 (three) times daily. 03/06/21   Pennie Banter, DO  levothyroxine (SYNTHROID) 88 MCG tablet Take 1 tablet (88 mcg total) by mouth daily before breakfast. 03/07/21   Esaw Grandchild A, DO  losartan (COZAAR) 50 MG tablet Take 1 tablet (50 mg total) by mouth daily. 05/16/22   Hollice Espy, MD  rOPINIRole (REQUIP) 0.25 MG tablet Take 0.25 mg by mouth daily.    [provider]  senna (SENOKOT) 8.6 MG tablet Take 8.6 mg by mouth 2 (two) times daily.    [provider]  Vitamin D, Ergocalciferol, (DRISDOL) 1.25 MG (50000 UNIT) CAPS capsule Take 50,000 Units by mouth every Sunday.    [provider]  vitamin E 1000 UNIT capsule Take 1,000 Units by mouth daily.    [provider]    Physical Exam: Vitals:   11/12/23 1600 11/12/23 1615 11/12/23 1645 11/12/23 1730  BP: 137/81 130/85 (!) 130/95 (!) 212/151  Pulse: 68 74 65 (!) 289  Resp: 12 14 14 14   Temp:      TempSrc:      SpO2: 94% 92% 94% 100%  Weight:      Height:        Physical Exam Constitutional:      General: She is not in acute distress.    Appearance: Normal appearance.  HENT:     Head: Normocephalic and atraumatic.      Mouth/Throat:     Mouth: Mucous membranes are moist.     Pharynx: Oropharynx is clear.  Eyes:     Extraocular Movements: Extraocular movements intact.     Pupils: Pupils are equal, round, and reactive to light.  Cardiovascular:     Rate and Rhythm: Normal rate and regular rhythm.     Pulses: Normal pulses.     Heart sounds: Normal heart sounds.  Pulmonary:     Effort: Pulmonary effort is normal. No respiratory distress.     Breath sounds: Normal breath sounds.  Abdominal:     General: Bowel sounds are normal.  There is no distension.     Palpations: Abdomen is soft.     Tenderness: There is no abdominal tenderness.  Musculoskeletal:        General: No swelling or deformity.  Skin:    General: Skin is warm and dry.  Neurological:     Comments: Mental Status: Patient is awake, alert, oriented to person and place. No signs of aphasia or neglect Cranial Nerves: II: Pupils equal, round, and reactive to light.   III,IV, VI: EOMI without ptosis or diploplia.  V: Facial sensation is symmetric to light touch VII: Mild facial droop on left. VIII: hearing is intact to voice X: Uvula elevates symmetrically XI: Shoulder shrug is symmetric. XII: tongue is midline without atrophy or fasciculations.  Motor: Good effort thorughout, 5/5 bilateral upper extremities.  4-5/5 left lower extremity, 3-/5 right lower extremity. Sensory: Sensation is grossly intact except for chronic reduced right lower extremity sensation    Labs on Admission: I have personally reviewed following labs and imaging studies  CBC: Recent Labs  Lab 11/12/23 1527 11/12/23 1532  WBC 8.0  --   NEUTROABS 5.6  --   HGB 15.8* 16.0*  HCT 49.3* 47.0*  MCV 97.4  --   PLT 233  --     Basic Metabolic Panel: Recent Labs  Lab 11/12/23 1527 11/12/23 1532  NA 142 141  K 4.1 4.1  CL 104 104  CO2 25  --   GLUCOSE 104* 101*  BUN 13 16  CREATININE 0.84 0.80  CALCIUM 9.3  --     GFR: Estimated Creatinine Clearance:  58.8 mL/min (by C-G formula based on SCr of 0.8 mg/dL).  Liver Function Tests: Recent Labs  Lab 11/12/23 1527  AST 24  ALT 20  ALKPHOS 44  BILITOT 1.1  PROT 6.2*  ALBUMIN 3.5    Urine analysis:    Component Value Date/Time   COLORURINE YELLOW (A) 11/14/2022 1352   APPEARANCEUR HAZY (A) 11/14/2022 1352   APPEARANCEUR Clear 12/31/2013 1542   LABSPEC 1.016 11/14/2022 1352   LABSPEC 1.004 12/31/2013 1542   PHURINE 8.0 11/14/2022 1352   GLUCOSEU NEGATIVE 11/14/2022 1352   GLUCOSEU Negative 12/31/2013 1542   HGBUR NEGATIVE 11/14/2022 1352   BILIRUBINUR NEGATIVE 11/14/2022 1352   BILIRUBINUR Negative 12/31/2013 1542   KETONESUR NEGATIVE 11/14/2022 1352   PROTEINUR NEGATIVE 11/14/2022 1352   NITRITE NEGATIVE 11/14/2022 1352   LEUKOCYTESUR SMALL (A) 11/14/2022 1352   LEUKOCYTESUR Negative 12/31/2013 1542    Radiological Exams on Admission: CT HEAD CODE STROKE WO CONTRAST Result Date: 11/12/2023 CLINICAL DATA:  Code stroke. 88 year old female. Left side deficit. EXAM: CT HEAD WITHOUT CONTRAST TECHNIQUE: Contiguous axial images were obtained from the base of the skull through the vertex without intravenous contrast. RADIATION DOSE REDUCTION: This exam was performed according to the departmental dose-optimization program which includes automated exposure control, adjustment of the mA and/or kV according to patient size and/or use of iterative reconstruction technique. COMPARISON:  Head CT 08/31/2023, 05/05/2022.  Brain MRI 10/01/2018. FINDINGS: Brain: Stable cerebral volume, chronic ventricular prominence since 2023. No midline shift, mass effect, or evidence of intracranial mass lesion. No acute intracranial hemorrhage identified. Confluent chronic periventricular white matter hypodensity. Stable gray-white matter differentiation throughout the brain. No cortically based acute infarct identified. Deep gray nuclei, posterior fossa appears stable and negative. Vascular: Calcified  atherosclerosis at the skull base. No suspicious intracranial vascular hyperdensity. Skull: Intact.  No acute osseous abnormality identified. Sinuses/Orbits: Mild bubbly opacity in the left sphenoid sinus,  not significantly changed and other Visualized paranasal sinuses and mastoids are stable and well aerated. Other: No gaze deviation. No acute orbit or scalp soft tissue finding. ASPECTS [facility] Stroke Program Early CT Score) Total score (0-10 with 10 being normal): 10 IMPRESSION: 1. ASPECTS 10. Stable non contrast CT appearance of the brain since 2023. 2. These results were communicated to Dr. Selina Cooley at 3:42 pm on 11/12/2023 by text page via the [facility] messaging system. Electronically Signed   By: Odessa Fleming M.D.   On: 11/12/2023 15:43   EKG: Independently reviewed.  Sinus rhythm at 60 bpm.  Baseline artifact likely from patient's tremor.  Nonspecific T wave changes.  Assessment/Plan Principal Problem:   Focal neurological deficit Active Problems:   Hypertension   Parkinsonism (HCC)   Chronic diastolic CHF (congestive heart failure) (HCC)   Hypothyroidism   Morbid obesity (HCC)   OSA (obstructive sleep apnea)   Focal neurologic deficit > Patient presenting from rehab facility when she was noted to have left facial droop and worsening of her chronic Parkinson's tremor. > Possible subtle facial droop noted in the ED.  Neurology consulted and plan for TIA workup. > Has some right lower extremity weakness which is chronic. > CT head showed no acute abnormality. - Monitor on telemetry overnight - Appreciate neurology recommendations and assistance - Allow for permissive HTN (systolic < 220 and diastolic < 120) - Continue home ASA, Plavix added in ED - Statin, pending result of lipid panel - Echocardiogram  - Carotid doppler or CTA head & neck  - MRI brain - Lipid panel  - Tele monitoring  - SLP eval - PT/OT  Hypertension - Holding antihypertensives as above  Hypothyroidism - Continue  home Synthroid  Chronic diastolic CHF > Last echo was last year through [place] [facility].  EF greater than 55%, G1 DD, normal RV function. - Holding Lasix for now in the setting of permissive hypertension  CAD - Continue aspirin, Plavix  Neuropathy - Continue home gabapentin  Parkinsonism Restless leg syndrome > Patient not on carbidopa levodopa; working with pharmacy to confirm. > Possible progression of dementia/delirium.  Worsening confusion/delirium the last few weeks.  Did have COVID about a month ago per son. - Appreciate any neurology recommendations. - Continue gabapentin, Requip  OSA - Does not tolerate CPAP  Obesity - Noted   History of breast cancer - Noted  Ovarian cancer > Ovarian cancer diagnosed in 2017 during surgical procedure for SBO.  Patient noted to have progressed to peritoneal carcinomatosis.  Status post chemo, hormonal therapies, resection. > Not currently on any treatment.  DVT prophylaxis: SCDs for now Code Status:   DNR/DNI  Family Communication:  Son updated at bedside Disposition Plan:   Patient is from:  [facility] DC to:  Same as above  Anticipated DC date:  1 to 2 days  Anticipated DC barriers: None  Consults called:  Neurology Admission status:  Observation, telemetry  Severity of Illness: The appropriate patient status for this patient is OBSERVATION. Observation status is judged to be reasonable and necessary in order to provide the required intensity of service to ensure the patient's safety. The patient's presenting symptoms, physical exam findings, and initial radiographic and laboratory data in the context of their medical condition is felt to place them at decreased risk for further clinical deterioration. Furthermore, it is anticipated that the patient will be medically stable for discharge from the hospital within 2 midnights of admission.   Synetta Fail MD  [facility]  How to contact the [facility]  Attending or Consulting provider 7A - 7P or covering provider during after hours 7P -7A, for this patient?   Check the care team in [facility] and look for a) attending/consulting [facility] provider listed and b) the [facility] team listed Log into [email or web address] and use [facility] universal password to access. If you do not have the password, please contact the hospital operator. Locate the [facility] provider you are looking for under [facility] and page to a number that you can be directly reached. If you still have difficulty reaching the provider, please page the [facility] (Director on Call) for the Hospitalists listed on amion for assistance.  11/12/2023, 6:09 PM

## 2023-11-12 NOTE — Code Documentation (Signed)
 Stroke Response Nurse Documentation Code Documentation  Susan Salas is a 88 y.o. female arriving to [facility]  via [facility] on 03/212/2025 with past medical hx of HTN, hypothyroidism, parkinsons disease, CHF, ovarian cancer. On No antithrombotic. Code stroke was activated by EMS.   Patient from facility where she was LKW at 1400 and now complaining of tremors amd left facial droop. Per EMS, staff noted her to be tremoring outside of her baseline tremors, left sided facial droop, and pain behind left eye  slurred speech, and confused.  Stroke team at the bedside on patient arrival. Labs drawn and patient cleared for CT by EDP. Patient to CT with team. NIHSS 1, see documentation for details and code stroke times. Patient with right leg weakness on exam. The following imaging was completed:  CT Head. Patient is not a candidate for IV Thrombolytic due to symptoms too mild per MD. Patient is not a candidate for IR due to no LVO noted on imaging per MD.   Care Plan: VS/NIHSS q49min until out of window at 1830, then q2hr x 12hr, then q4.  Bedside handoff with ED RN Idalia Needle.    Susan Salas  Stroke Response RN

## 2023-11-12 NOTE — Consult Note (Addendum)
 NEUROLOGY CONSULT NOTE   Date of service: November 12, 2023 Patient Name: Susan Salas MRN:  [id number] DOB:  02-03-1936 Chief Complaint: "left facial droop and left eye pain " Requesting Provider: Rolan Bucco, MD  History of Present Illness  Susan Salas is a 88 y.o. female with hx of HTN, Hypothyroidism, parkinson disease, CHF and hx of ovarian cancer who presents from [facility] via EMS for acute onset of left facial droop and left eye pain. Code stroke was activated by EMS. LKW 1400. Per EMS staff noted her to be confused and have a facial droop. Attempted to call staff and Jill Alexanders who was with her at the time and called EMS has gone home for the day and the nurse on shift had not seen her since this morning. She c/o of ha 5/10.  CT head with no acute process.   On exam patient is awake and alert, oriented x 4, no aphasia or dysarthria, visual fields full, no facial droop, no upper extremity weakness, right lower extremity weakness 3/5- patient states this is old, left leg 4/5, sensation intact, no ataxia   LKW: 1400 Modified rankin score: 4-Needs assistance to walk and tend to bodily needs IV Thrombolysis:  No low NIHSS EVT: No LVO   NIHSS components Score: Comment  1a Level of Conscious 0[x]  1[]  2[]  3[]      1b LOC Questions 0[x]  1[]  2[]       1c LOC Commands 0[x]  1[]  2[]       2 Best Gaze 0[x]  1[]  2[]       3 Visual 0[x]  1[]  2[]  3[]      4 Facial Palsy 0[x]  1[]  2[]  3[]      5a Motor Arm - left 0[x]  1[]  2[]  3[]  4[]  UN[]    5b Motor Arm - Right 0[x]  1[]  2[]  3[]  4[]  UN[]    6a Motor Leg - Left 0[x]  1[]  2[]  3[]  4[]  UN[]    6b Motor Leg - Right 0[]  1[x]  2[]  3[]  4[]  UN[]    7 Limb Ataxia 0[x]  1[]  2[]  3[]  UN[]     8 Sensory 0[x]  1[]  2[]  UN[]      9 Best Language 0[x]  1[]  2[]  3[]      10 Dysarthria 0[x]  1[]  2[]  UN[]      11 Extinct. and Inattention 0[x]  1[]  2[]       TOTAL: 1      ROS  Comprehensive ROS performed and pertinent positives documented in HPI    Past History   Past  Medical History:  Diagnosis Date   Cancer (HCC) 2017   ovarian   CHF (congestive heart failure) (HCC)    Complication of anesthesia    hard to wake from general anesthesia, long porcedures nausea and vomiting.   Hypertension    Hypothyroidism    Parkinson disease (HCC)    PONV (postoperative nausea and vomiting)    Spinal headache    with labor of 60 year old son.    Past Surgical History:  Procedure Laterality Date   ABDOMINAL HYSTERECTOMY     AMPUTATION TOE Left 05/02/2017   Procedure: AMPUTATION TOE/Left 4th mpj/28820;  Surgeon: Linus Galas, DPM;  Location: [facility] ORS;  Service: Podiatry;  Laterality: Left;   APPENDECTOMY     BACK SURGERY  2014   lamenectomy   BREAST SURGERY Bilateral 2003   reduction   CATARACT EXTRACTION W/ INTRAOCULAR LENS IMPLANT Bilateral 2014   CHOLECYSTECTOMY     COLON SURGERY  2017   FRACTURE SURGERY Right 2003   screws & plates  KYPHOPLASTY N/A 02/26/2021   Procedure: L4 KYPHOPLASTY;  Surgeon: Kennedy Bucker, MD;  Location: [facility] ORS;  Service: Orthopedics;  Laterality: N/A;   TONSILLECTOMY      Family History: No family history on file.  Social History  reports that she quit smoking about 40 years ago. Her smoking use included cigarettes. She has never used smokeless tobacco. She reports that she does not drink alcohol and does not use drugs.  Allergies  Allergen Reactions   Other Hives and Rash    "filler in all generic drugs"   Dissolvable stitches-do not dissolve   Statins Other (See Comments)    Muscle weakness Other reaction(s): Other (See Comments) Other reaction(s): Other (See Comments) Muscle weakness Muscle weakness Muscle weakness    Sulfa Antibiotics Shortness Of Breath   Sulfasalazine Shortness Of Breath   Ibandronate Other (See Comments)    (BONIVA) Muscle Pain   Sertraline     Other reaction(s): UNKNOWN   Hydrochlorothiazide Rash   Lidocaine Rash   Morphine Rash   Ramipril Rash   Tape Rash    Pt can tolerate  paper tape.    Medications  No current facility-administered medications for this encounter.  Current Outpatient Medications:    aspirin 81 MG chewable tablet, Take 81 mg by mouth daily., Disp: , Rfl:    baclofen (LIORESAL) 10 MG tablet, Take 5 mg by mouth 2 (two) times daily., Disp: , Rfl:    carvedilol (COREG CR) 10 MG 24 hr capsule, Take 10 mg by mouth daily., Disp: , Rfl:    denosumab (PROLIA) 60 MG/ML SOSY injection, Inject 60 mg into the skin every 6 (six) months., Disp: , Rfl:    DULoxetine (CYMBALTA) 30 MG capsule, Take 60 mg by mouth daily., Disp: , Rfl:    furosemide (LASIX) 40 MG tablet, Take 40 mg by mouth daily as needed for edema., Disp: , Rfl:    gabapentin (NEURONTIN) 300 MG capsule, Take 2 capsules (600 mg total) by mouth 3 (three) times daily., Disp: , Rfl:    levothyroxine (SYNTHROID) 88 MCG tablet, Take 1 tablet (88 mcg total) by mouth daily before breakfast., Disp: , Rfl:    losartan (COZAAR) 50 MG tablet, Take 1 tablet (50 mg total) by mouth daily., Disp: 30 tablet, Rfl: 1   rOPINIRole (REQUIP) 0.25 MG tablet, Take 0.25 mg by mouth daily., Disp: , Rfl:    senna (SENOKOT) 8.6 MG tablet, Take 8.6 mg by mouth 2 (two) times daily., Disp: , Rfl:    Vitamin D, Ergocalciferol, (DRISDOL) 1.25 MG (50000 UNIT) CAPS capsule, Take 50,000 Units by mouth every Sunday., Disp: , Rfl:    vitamin E 1000 UNIT capsule, Take 1,000 Units by mouth daily., Disp: , Rfl:   Vitals   Vitals:   11/12/23 1500  Weight: 95.8 kg    Body mass index is 35.15 kg/m.  Physical Exam   Constitutional: Appears well-developed and well-nourished.  Psych: Affect appropriate to situation.  Eyes: No scleral injection.  HENT: No OP obstruction.  Head: Normocephalic.  Cardiovascular: Normal rate and regular rhythm.  Respiratory: Effort normal, non-labored breathing.  GI: Soft.  No distension. There is no tenderness.  Skin: WDI.   Neurologic Examination  Mental Status -  Level of arousal and  orientation to time, place, and person were intact . Language including expression, naming, repetition, comprehension was assessed and found intact .  Cranial Nerves II - XII - II - Visual field intact OU . III, IV, VI - Extraocular  movements intact . V - Facial sensation intact bilaterally . VII - Facial movement intact bilaterally . VIII - Hearing & vestibular intact bilaterally . X - Palate elevates symmetrically . XI - Chin turning & shoulder shrug intact bilaterally . XII - Tongue protrusion intact .  Motor Strength - The patient's strength was normal in bilateral uppers and left lower, right lower 41/5 - old and pronator drift was absent.  Bulk was normal and fasciculations were absent .   Motor Tone - Muscle tone was assessed at the neck and appendages and was normal . Sensory - Light touch, temperature/pinprick were assessed and were symmetrical .   Coordination - The patient had normal movements in the hands and feet with no ataxia or dysmetria.  Tremor was absent . Gait and Station - deferred.  Labs/Imaging/Neurodiagnostic studies   CBC:  Recent Labs  Lab 05-Dec-2023 1532  HGB 16.0*  HCT 47.0*   Basic Metabolic Panel:  Lab Results  Component Value Date   NA 141 Dec 05, 2023   K 4.1 12/05/23   CO2 25 11/14/2022   GLUCOSE 101 (H) 05-Dec-2023   BUN 16 December 05, 2023   CREATININE 0.80 12/05/2023   CALCIUM 9.9 11/14/2022   GFRNONAA >60 11/14/2022   GFRAA >60 12/31/2013   Lipid Panel: No results found for: "LDLCALC" HgbA1c: No results found for: "HGBA1C" Urine Drug Screen: No results found for: "LABOPIA", "COCAINSCRNUR", "LABBENZ", "AMPHETMU", "THCU", "LABBARB"  Alcohol Level No results found for: "ETH" INR  Lab Results  Component Value Date   INR 1.0 02/26/2021   APTT  Lab Results  Component Value Date   APTT 27 09/25/2020   AED levels: No results found for: "PHENYTOIN", "ZONISAMIDE", "LAMOTRIGINE", "LEVETIRACETA"  CT Head without contrast(Personally reviewed):   No acute process   ASSESSMENT   Susan Salas is a 88 y.o. female hx of HTN, Hypothyroidism, parkinson disease, CHF and hx of ovarian cancer who presents from [facility] via EMS for acute onset of left facial droop and left eye pain. Code stroke was activated by EMS. LKW 1400. Per EMS staff noted her to be confused and have a facial droop.  RECOMMENDATIONS    - Admit for stroke workup - Permissive HTN x48 hrs from sx onset or until stroke ruled out by MRI goal BP <220/120. PRN labetalol or hydralazine if BP above these parameters. Avoid oral antihypertensives. - MRI brain wo contrast - CTA/MRA if not already obtained - TTE w/ bubble - Check A1c and LDL + add statin per guidelines - Plavix 75mg  daily - ASA not ordered 2/2 allergy notification to sulfasalazine. If admitting MD is able to confirm that this is a home medication or that she tolerates it without issue please order ASA 81mg  daily in addition to plavix. - q4 hr neuro checks - STAT head CT for any change in neuro exam - Tele - PT/OT/SLP - Stroke education - Amb referral to neurology upon discharge   Stroke team will follow starting tomorrow  Note by Gevena Mart NP and edited by MD as needed   Attending Neurohospitalist Addendum Patient seen and examined with APP/Resident. Agree with the history and physical as documented above. Agree with the plan as documented, which I helped formulate. I have edited the note above to reflect my full findings and recommendations. I have independently reviewed the chart, obtained history, review of systems and examined the patient.I have personally reviewed pertinent head/neck/spine imaging (CT/MRI). Please feel free to call with any questions.  -- Bing Neighbors,  MD [facility] [phone]  If 7pm- 7am, please page neurology on call as listed in AMION.

## 2023-11-12 NOTE — ED Triage Notes (Signed)
 Pt BIB [facility] from [facility] nursing facility as Code Stroke. LKN 1400 when staff at facility noticed she was having more tremors than normal (Hx of Parkinson's), Lt sided facial droop & c/o pain behind her Lt eye.EMS reports they only noticed her tremors, but no facial droop. Pt A/Ox4, reports en route that the pain behind her Lt eye is now felt all over her head. Pt reports that she did fall this past Monday. EMS reports her spO2 was reading a range from 80-100% so they placed her on 2L O2 via n/c, does not wear 2 at baseline. Walks with 1 person assist at baseline. VSS & 20g Lt AC.

## 2023-11-12 NOTE — ED Notes (Signed)
 Admitting at bedside

## 2023-11-12 NOTE — Progress Notes (Signed)
 Patient returned to the unit.

## 2023-11-12 NOTE — Progress Notes (Signed)
 Patient is having right neck burning pain. her blood pressure is 155/120. Informed Dr. Loney Loh about the patient complaint.

## 2023-11-12 NOTE — Progress Notes (Signed)
 Patient was sent to the MRI accompanied by transport .

## 2023-11-12 NOTE — ED Notes (Signed)
 Patient transported to admitting floor via stretcher. All belongings sent with patient Patient and family without questions at this time.

## 2023-11-12 NOTE — Progress Notes (Signed)
 Patient arrived in the unit. Accompanied by RN

## 2023-11-12 NOTE — ED Provider Notes (Signed)
 [place] EMERGENCY DEPARTMENT AT [facility] Provider Note   CSN: [id number] Arrival date & time: 11/12/23  1525  An emergency department physician performed an initial assessment on this suspected stroke patient at 27.  History  Chief Complaint  Patient presents with   Code Stroke    Susan Salas is a 88 y.o. female.  Patient is a 88 year old who presents as a code stroke.  She has a history of CHF, hypertension, Parkinson's disease.  A CNA at her facility noticed some left-sided facial drooping and possible change in her mental status.  Last known normal was 1400.  She also was complaining of pain behind her left eye.  She denies any vision changes in her eye.  She says the pain has improved but is still little bit there.  Reportedly her tremor is also worse.  She denies any other complaints.  No recent illnesses per her report.       Home Medications Prior to Admission medications   Medication Sig Start Date End Date Taking? Authorizing Provider  aspirin 81 MG chewable tablet Take 81 mg by mouth daily.    [provider]  baclofen (LIORESAL) 10 MG tablet Take 5 mg by mouth 2 (two) times daily.    [provider]  carvedilol (COREG CR) 10 MG 24 hr capsule Take 10 mg by mouth daily.    [provider]  denosumab (PROLIA) 60 MG/ML SOSY injection Inject 60 mg into the skin every 6 (six) months.    [provider]  DULoxetine (CYMBALTA) 30 MG capsule Take 60 mg by mouth daily.    [provider]  furosemide (LASIX) 40 MG tablet Take 40 mg by mouth daily as needed for edema.    [provider]  gabapentin (NEURONTIN) 300 MG capsule Take 2 capsules (600 mg total) by mouth 3 (three) times daily. 03/06/21   Pennie Banter, DO  levothyroxine (SYNTHROID) 88 MCG tablet Take 1 tablet (88 mcg total) by mouth daily before breakfast. 03/07/21   Esaw Grandchild A, DO  losartan (COZAAR) 50 MG tablet Take 1 tablet (50 mg total)  by mouth daily. 05/16/22   Hollice Espy, MD  rOPINIRole (REQUIP) 0.25 MG tablet Take 0.25 mg by mouth daily.    [provider]  senna (SENOKOT) 8.6 MG tablet Take 8.6 mg by mouth 2 (two) times daily.    [provider]  Vitamin D, Ergocalciferol, (DRISDOL) 1.25 MG (50000 UNIT) CAPS capsule Take 50,000 Units by mouth every Sunday.    [provider]  vitamin E 1000 UNIT capsule Take 1,000 Units by mouth daily.    [provider]      Allergies    Other, Statins, Sulfa antibiotics, Sulfasalazine, Ibandronate, Sertraline, Hydrochlorothiazide, Lidocaine, Morphine, Ramipril, and Tape    Review of Systems   Review of Systems  Constitutional:  Negative for chills, diaphoresis, fatigue and fever.  HENT:  Negative for congestion, rhinorrhea and sneezing.   Eyes: Negative.   Respiratory:  Negative for cough, chest tightness and shortness of breath.   Cardiovascular:  Negative for chest pain and leg swelling.  Gastrointestinal:  Negative for abdominal pain, blood in stool, diarrhea, nausea and vomiting.  Genitourinary:  Negative for difficulty urinating, flank pain, frequency and hematuria.  Musculoskeletal:  Negative for arthralgias and back pain.  Skin:  Negative for rash.  Neurological:  Positive for tremors, facial asymmetry and headaches. Negative for dizziness, speech difficulty, weakness and numbness.  Physical Exam Updated Vital Signs BP (!) 212/151   Pulse (!) 289   Temp 97.6 F (36.4 C) (Oral)   Resp 14   Ht 5\' 5"  (1.651 m)   Wt 95.8 kg   SpO2 100%   BMI 35.15 kg/m  Physical Exam Constitutional:      Appearance: She is well-developed.  HENT:     Head: Normocephalic and atraumatic.  Eyes:     Pupils: Pupils are equal, round, and reactive to light.  Cardiovascular:     Rate and Rhythm: Normal rate and regular rhythm.     Heart sounds: Normal heart sounds.  Pulmonary:     Effort: Pulmonary effort is normal. No respiratory  distress.     Breath sounds: Normal breath sounds. No wheezing or rales.  Chest:     Chest wall: No tenderness.  Abdominal:     General: Bowel sounds are normal.     Palpations: Abdomen is soft.     Tenderness: There is no abdominal tenderness. There is no guarding or rebound.  Musculoskeletal:        General: Normal range of motion.     Cervical back: Normal range of motion and neck supple.  Lymphadenopathy:     Cervical: No cervical adenopathy.  Skin:    General: Skin is warm and dry.     Findings: No rash.  Neurological:     Mental Status: She is alert.     Comments: Oriented person and place.  She cannot tell me the year.  She is awake and alert.  She has some slight drooping of the corner of the left mouth.  Extraocular eye movements are intact.  Motor 5 out of 5 in upper extremities.  3 out of 5 in the right lower extremity as compared to 5 out of 5 in the left lower extremity.  She states this is chronic for her.     ED Results / Procedures / Treatments   Labs (all labs ordered are listed, but only abnormal results are displayed) Labs Reviewed  CBC - Abnormal; Notable for the following components:      Result Value   Hemoglobin 15.8 (*)    HCT 49.3 (*)    All other components within normal limits  COMPREHENSIVE METABOLIC PANEL - Abnormal; Notable for the following components:   Glucose, Bld 104 (*)    Total Protein 6.2 (*)    All other components within normal limits  I-STAT CHEM 8, ED - Abnormal; Notable for the following components:   Glucose, Bld 101 (*)    Calcium, Ion 1.14 (*)    Hemoglobin 16.0 (*)    HCT 47.0 (*)    All other components within normal limits  PROTIME-INR  APTT  DIFFERENTIAL  ETHANOL  HEMOGLOBIN A1C  LIPID PANEL  COMPREHENSIVE METABOLIC PANEL  CBC  CBG MONITORING, ED    EKG EKG Interpretation Date/Time:  Wednesday November 12 2023 15:59:16 EDT Ventricular Rate:  68 PR Interval:  61 QRS Duration:  102 QT Interval:  385 QTC  Calculation: 410 R Axis:   17  Text Interpretation: probable atrial flutter Short PR interval Low voltage, precordial leads Confirmed by Rolan Bucco [phone]) on 11/12/2023 4:10:02 PM  Radiology CT HEAD CODE STROKE WO CONTRAST Result Date: 11/12/2023 CLINICAL DATA:  Code stroke. 88 year old female. Left side deficit. EXAM: CT HEAD WITHOUT CONTRAST TECHNIQUE: Contiguous axial images were obtained from the base of the skull through the vertex without intravenous contrast. RADIATION DOSE REDUCTION: This  exam was performed according to the departmental dose-optimization program which includes automated exposure control, adjustment of the mA and/or kV according to patient size and/or use of iterative reconstruction technique. COMPARISON:  Head CT 08/31/2023, 05/05/2022.  Brain MRI 10/01/2018. FINDINGS: Brain: Stable cerebral volume, chronic ventricular prominence since 2023. No midline shift, mass effect, or evidence of intracranial mass lesion. No acute intracranial hemorrhage identified. Confluent chronic periventricular white matter hypodensity. Stable gray-white matter differentiation throughout the brain. No cortically based acute infarct identified. Deep gray nuclei, posterior fossa appears stable and negative. Vascular: Calcified atherosclerosis at the skull base. No suspicious intracranial vascular hyperdensity. Skull: Intact.  No acute osseous abnormality identified. Sinuses/Orbits: Mild bubbly opacity in the left sphenoid sinus, not significantly changed and other Visualized paranasal sinuses and mastoids are stable and well aerated. Other: No gaze deviation. No acute orbit or scalp soft tissue finding. ASPECTS [facility] Stroke Program Early CT Score) Total score (0-10 with 10 being normal): 10 IMPRESSION: 1. ASPECTS 10. Stable non contrast CT appearance of the brain since 2023. 2. These results were communicated to Dr. Selina Cooley at 3:42 pm on 11/12/2023 by text page via the [facility] messaging system.  Electronically Signed   By: Odessa Fleming M.D.   On: 11/12/2023 15:43    Procedures Procedures    Medications Ordered in ED Medications  clopidogrel (PLAVIX) tablet 75 mg (has no administration in time range)   stroke: early stages of recovery book (has no administration in time range)  aspirin chewable tablet 81 mg (has no administration in time range)  DULoxetine (CYMBALTA) DR capsule 60 mg (has no administration in time range)  levothyroxine (SYNTHROID) tablet 88 mcg (has no administration in time range)  gabapentin (NEURONTIN) capsule 600 mg (has no administration in time range)  rOPINIRole (REQUIP) tablet 0.25 mg (has no administration in time range)    ED Course/ Medical Decision Making/ A&P                                 Medical Decision Making Amount and/or Complexity of Data Reviewed Labs: ordered. Radiology: ordered.  Risk Decision regarding hospitalization.   Patient is an 88 year old who presents as a code stroke.  CT scan does not show any acute abnormality.  Labs reviewed and are nonconcerning.  EKG shows possible atrial flutter.  However suspect she has artifact from her tremor.  I reviewed her chart and during a prior admission with a diagnosis of atrial flutter, cardiology reviewed the tracings and felt that it was a sinus rhythm with artifact from her tremor.  Dr. Selina Cooley recommends admission for TIA workup.  I spoke with Dr. Alinda Money who will admit the patient.  Final Clinical Impression(s) / ED Diagnoses Final diagnoses:  TIA (transient ischemic attack)  Hypertension, unspecified type    Rx / DC Orders ED Discharge Orders     None         Rolan Bucco, MD 11/12/23 1812

## 2023-11-13 ENCOUNTER — Observation Stay (HOSPITAL_COMMUNITY)

## 2023-11-13 DIAGNOSIS — I110 Hypertensive heart disease with heart failure: Secondary | ICD-10-CM

## 2023-11-13 DIAGNOSIS — G20A1 Parkinson's disease without dyskinesia, without mention of fluctuations: Secondary | ICD-10-CM | POA: Diagnosis not present

## 2023-11-13 DIAGNOSIS — I5030 Unspecified diastolic (congestive) heart failure: Secondary | ICD-10-CM

## 2023-11-13 DIAGNOSIS — E785 Hyperlipidemia, unspecified: Secondary | ICD-10-CM

## 2023-11-13 DIAGNOSIS — I509 Heart failure, unspecified: Secondary | ICD-10-CM

## 2023-11-13 DIAGNOSIS — R29818 Other symptoms and signs involving the nervous system: Secondary | ICD-10-CM | POA: Diagnosis not present

## 2023-11-13 LAB — CBC
HCT: 48.4 % — ABNORMAL HIGH (ref 36.0–46.0)
Hemoglobin: 15.8 g/dL — ABNORMAL HIGH (ref 12.0–15.0)
MCH: 31 pg (ref 26.0–34.0)
MCHC: 32.6 g/dL (ref 30.0–36.0)
MCV: 94.9 fL (ref 80.0–100.0)
Platelets: 239 10*3/uL (ref 150–400)
RBC: 5.1 MIL/uL (ref 3.87–5.11)
RDW: 14.6 % (ref 11.5–15.5)
WBC: 9 10*3/uL (ref 4.0–10.5)
nRBC: 0 % (ref 0.0–0.2)

## 2023-11-13 LAB — ECHOCARDIOGRAM COMPLETE
Area-P 1/2: 2.95 cm2
Height: 65 in
S' Lateral: 2.7 cm
Weight: 3379.21 [oz_av]

## 2023-11-13 LAB — LIPID PANEL
Cholesterol: 224 mg/dL — ABNORMAL HIGH (ref 0–200)
HDL: 49 mg/dL (ref >40)
LDL Cholesterol: 155 mg/dL — ABNORMAL HIGH (ref 0–99)
Total CHOL/HDL Ratio: 4.6 ratio
Triglycerides: 101 mg/dL (ref <150)
VLDL: 20 mg/dL (ref 0–40)

## 2023-11-13 LAB — COMPREHENSIVE METABOLIC PANEL
ALT: 22 U/L (ref 0–44)
AST: 22 U/L (ref 15–41)
Albumin: 3.8 g/dL (ref 3.5–5.0)
Alkaline Phosphatase: 43 U/L (ref 38–126)
Anion gap: 10 (ref 5–15)
BUN: 10 mg/dL (ref 8–23)
CO2: 25 mmol/L (ref 22–32)
Calcium: 8.8 mg/dL — ABNORMAL LOW (ref 8.9–10.3)
Chloride: 105 mmol/L (ref 98–111)
Creatinine, Ser: 0.76 mg/dL (ref 0.44–1.00)
GFR, Estimated: >60 mL/min (ref >60)
Glucose, Bld: 89 mg/dL (ref 70–99)
Potassium: 3.8 mmol/L (ref 3.5–5.1)
Sodium: 140 mmol/L (ref 135–145)
Total Bilirubin: 1.7 mg/dL — ABNORMAL HIGH (ref 0.0–1.2)
Total Protein: 6.8 g/dL (ref 6.5–8.1)

## 2023-11-13 MED ORDER — DULOXETINE HCL 60 MG PO CPEP
60.0000 mg | ORAL_CAPSULE | Freq: Every day | ORAL | 0 refills | Status: AC
Start: 2023-11-13 — End: ?

## 2023-11-13 MED ORDER — CLOPIDOGREL BISULFATE 75 MG PO TABS: 75.0000 mg | ORAL_TABLET | Freq: Every day | ORAL | Status: AC

## 2023-11-13 MED ORDER — ROPINIROLE HCL 0.25 MG PO TABS: 0.2500 mg | ORAL_TABLET | Freq: Three times a day (TID) | ORAL | 0 refills | Status: AC

## 2023-11-13 MED ORDER — BACLOFEN 10 MG PO TABS: 5.0000 mg | ORAL_TABLET | Freq: Two times a day (BID) | ORAL | 0 refills | Status: AC

## 2023-11-13 NOTE — Discharge Summary (Signed)
 Physician Discharge Summary  Susan Salas ZOX:[id number] DOB: 1936-06-08 DOA: 11/12/2023  PCP: Lynnea Ferrier, MD  Admit date: 11/12/2023 Discharge date: 11/13/2023  Time spent: 24 minutes  Recommendations for Outpatient Follow-up:  Requires Plavix for 20 more days aspirin in addition and then aspirin lifelong Need outpatient follow-up with neurology regarding anticholinergic meds to help with seizures Please consider outpatient goals of care  Discharge Diagnoses:  MAIN problem for hospitalization   Possible TIA  Please see below for itemized issues addressed in [facility]- refer to other progress notes for clarity if needed  Discharge Condition: Improved  Diet recommendation: Cecilio Asper Weights   11/12/23 1500 11/12/23 1551  Weight: 95.8 kg 95.8 kg    History of present illness:  88 year old white female. Lumbar spinal stenosis with kyphoplasty 02/26/2021 in the setting of a fall, HTN, HFpEF, hypothyroid Parkinson's disease follows with Dr. Sherryll Burger of neurology unclear if on Sinemet Previous low-grade cystadenoma ovary HFpEF ?  SBO and bowel perforation Previous admission had delirium with uncontrolled pain   3/12 last known normal 1400 noted to be confused facial droop on left side headache 5/10 in addition to pain behind right eye with out any visual changes no recent other CT head showed stable noncontrast CT CT angio showed no emergent LVO or high-grade stenosis MRI brain no acute intracranial abnormality findings of chronic small vessel ischemia and volume loss Patient placed on Plavix-aspirin ordered as patient on sulfasalazine  Therapy workup was completed during hospital stay she was at her baseline she is normally wheelchair-bound and has lived to [facility] for 9 [facility]-she does not ambulate so it was felt that the benefit outweighed the risk in terms of protection from TIA and further stroke with Plavix low-dose I discussed scenario with  He tells me that her  delusions and confusion has become worse over the past several months and I recommended that patient go onto an anticholinergic medication in the outpatient setting under care of her neurologist  She will need ambulance transport back to facility    Discharge Exam: Vitals:   11/13/23 0804 11/13/23 1115  BP: (!) 152/103 (!) 130/102  Pulse: 78 91  Resp:    Temp: 97.6 F (36.4 C) 97.9 F (36.6 C)  SpO2: 95% 94%    Subj on day of d/c   Awake coherent no distress seems comfortable  General Exam on discharge  EOMI NCAT no focal deficit no icterus no pallor no wheeze no rales no rhonchi Abdomen soft Tremor noted with intention S1-S2 no murmur Chest clear Abdomen soft no rebound slightly swollen lower extremities   Discharge Instructions   Discharge Instructions     Diet - low sodium heart healthy   Complete by: As directed    Discharge instructions   Complete by: As directed    You might of had a small mini stroke-I would recommend that you finish plavix course 20 more days and then use ASA only-make sure that you are secure in your wheelchair and that you are not risk for falling Please follow-up with your neurologist consider an anticholinergic drug to help with your tremors   Increase activity slowly   Complete by: As directed       Allergies as of 11/13/2023       Reactions   Other Hives, Rash   "filler in all generic drugs" Dissolvable stitches-do not dissolve   Statins Other (See Comments)   Muscle weakness Other reaction(s): Other (See Comments) Other reaction(s): Other (  See Comments) Muscle weakness Muscle weakness Muscle weakness   Sulfa Antibiotics Shortness Of Breath   Sulfasalazine Shortness Of Breath   Ibandronate Other (See Comments)   (BONIVA) Muscle Pain   Sertraline    Other reaction(s): UNKNOWN   Hydrochlorothiazide Rash   Lidocaine Rash   Morphine Rash   Ramipril Rash   Tape Rash   Pt can tolerate paper tape.        Medication  List     STOP taking these medications    loratadine 10 MG tablet Commonly known as: CLARITIN       TAKE these medications    acetaminophen 325 MG tablet Commonly known as: TYLENOL Take 650 mg by mouth every 8 (eight) hours as needed for moderate pain (pain score 4-6) or fever.   aspirin 81 MG chewable tablet Take 81 mg by mouth daily.   baclofen 10 MG tablet Commonly known as: LIORESAL Take 0.5 tablets (5 mg total) by mouth 2 (two) times daily.   carvedilol 3.125 MG tablet Commonly known as: COREG Take 3.125 mg by mouth 2 (two) times daily.   clopidogrel 75 MG tablet Commonly known as: PLAVIX Take 1 tablet (75 mg total) by mouth daily. Start taking on: November 14, 2023   diclofenac Sodium 1 % Gel Commonly known as: VOLTAREN Apply 2 g topically daily as needed (knee pain).   DULoxetine 60 MG capsule Commonly known as: CYMBALTA Take 1 capsule (60 mg total) by mouth daily.   gabapentin 300 MG capsule Commonly known as: NEURONTIN Take 2 capsules (600 mg total) by mouth 3 (three) times daily.   levothyroxine 88 MCG tablet Commonly known as: SYNTHROID Take 1 tablet (88 mcg total) by mouth daily before breakfast.   losartan 50 MG tablet Commonly known as: COZAAR Take 1 tablet (50 mg total) by mouth daily.   Prolia 60 MG/ML Sosy injection Generic drug: denosumab Inject 60 mg into the skin every 6 (six) months.   rOPINIRole 0.25 MG tablet Commonly known as: REQUIP Take 1 tablet (0.25 mg total) by mouth 3 (three) times daily.   senna 8.6 MG tablet Commonly known as: SENOKOT Take 8.6 mg by mouth 2 (two) times daily.   Vitamin D (Ergocalciferol) 1.25 MG (50000 UNIT) Caps capsule Commonly known as: DRISDOL Take 50,000 Units by mouth every 7 (seven) days.       Allergies  Allergen Reactions   Other Hives and Rash    "filler in all generic drugs"   Dissolvable stitches-do not dissolve   Statins Other (See Comments)    Muscle weakness Other reaction(s):  Other (See Comments) Other reaction(s): Other (See Comments) Muscle weakness Muscle weakness Muscle weakness    Sulfa Antibiotics Shortness Of Breath   Sulfasalazine Shortness Of Breath   Ibandronate Other (See Comments)    (BONIVA) Muscle Pain   Sertraline     Other reaction(s): UNKNOWN   Hydrochlorothiazide Rash   Lidocaine Rash   Morphine Rash   Ramipril Rash   Tape Rash    Pt can tolerate paper tape.      The results of significant diagnostics from this hospitalization (including imaging, microbiology, ancillary and laboratory) are listed below for reference.    Significant Diagnostic Studies: ECHOCARDIOGRAM COMPLETE Result Date: 11/13/2023    ECHOCARDIOGRAM REPORT   Patient Name:   JIMIA GENTLES Date of Exam: 11/13/2023 Medical Rec #:  [id number]        Height:       65.0 in Accession #:  [id number]       Weight:       211.2 lb Date of Birth:  08-29-1936         BSA:          2.024 m Patient Age:    87 years         BP:           167/113 mmHg Patient Gender: F                HR:           80 bpm. Exam Location:  Inpatient Procedure: 2D Echo, Cardiac Doppler and Color Doppler (Both Spectral and Color            Flow Doppler were utilized during procedure). Indications:    Stroke I63.9  History:        Patient has prior history of Echocardiogram examinations, most                 recent 05/03/2022. CHF; Risk Factors:Hypertension.  Sonographer:    Harriette Bouillon RDCS Referring Phys: [id number] Cecille Po MELVIN IMPRESSIONS  1. Left ventricular ejection fraction, by estimation, is 60 to 65%. The left ventricle has normal function. Left ventricular endocardial border not optimally defined to evaluate regional wall motion. There is mild concentric left ventricular hypertrophy. Left ventricular diastolic parameters are consistent with Grade I diastolic dysfunction (impaired relaxation).  2. Right ventricular systolic function is normal. The right ventricular size is normal. Tricuspid  regurgitation signal is inadequate for assessing PA pressure.  3. The mitral valve is normal in structure. No evidence of mitral valve regurgitation. No evidence of mitral stenosis.  4. The aortic valve was not well visualized. Aortic valve regurgitation is not visualized. No aortic stenosis is present.  5. The inferior vena cava is normal in size with greater than 50% respiratory variability, suggesting right atrial pressure of 3 mmHg. FINDINGS  Left Ventricle: Left ventricular ejection fraction, by estimation, is 60 to 65%. The left ventricle has normal function. Left ventricular endocardial border not optimally defined to evaluate regional wall motion. The left ventricular internal cavity size was normal in size. There is mild concentric left ventricular hypertrophy. Left ventricular diastolic parameters are consistent with Grade I diastolic dysfunction (impaired relaxation). Normal left ventricular filling pressure. Right Ventricle: The right ventricular size is normal. No increase in right ventricular wall thickness. Right ventricular systolic function is normal. Tricuspid regurgitation signal is inadequate for assessing PA pressure. Left Atrium: Left atrial size was normal in size. Right Atrium: Right atrial size was normal in size. Pericardium: There is no evidence of pericardial effusion. Mitral Valve: The mitral valve is normal in structure. No evidence of mitral valve regurgitation. No evidence of mitral valve stenosis. Tricuspid Valve: The tricuspid valve is normal in structure. Tricuspid valve regurgitation is not demonstrated. No evidence of tricuspid stenosis. Aortic Valve: The aortic valve was not well visualized. Aortic valve regurgitation is not visualized. No aortic stenosis is present. Pulmonic Valve: The pulmonic valve was normal in structure. Pulmonic valve regurgitation is not visualized. No evidence of pulmonic stenosis. Aorta: The aortic root is normal in size and structure. Venous: The  inferior vena cava is normal in size with greater than 50% respiratory variability, suggesting right atrial pressure of 3 mmHg. IAS/Shunts: No atrial level shunt detected by color flow Doppler.  LEFT VENTRICLE PLAX 2D LVIDd:         4.10 cm   Diastology LVIDs:  2.70 cm   LV e' medial:    5.98 cm/s LV PW:         1.20 cm   LV E/e' medial:  10.5 LV IVS:        1.10 cm   LV e' lateral:   7.62 cm/s LVOT diam:     2.00 cm   LV E/e' lateral: 8.3 LV SV:         69 LV SV Index:   34 LVOT Area:     3.14 cm  RIGHT VENTRICLE             IVC RV S prime:     18.80 cm/s  IVC diam: 0.70 cm TAPSE (M-mode): 1.5 cm LEFT ATRIUM             Index        RIGHT ATRIUM           Index LA diam:        3.60 cm 1.78 cm/m   RA Area:     14.10 cm LA Vol (A2C):   42.3 ml 20.89 ml/m  RA Volume:   33.10 ml  16.35 ml/m LA Vol (A4C):   42.8 ml 21.14 ml/m LA Biplane Vol: 44.2 ml 21.83 ml/m  AORTIC VALVE LVOT Vmax:   105.00 cm/s LVOT Vmean:  73.500 cm/s LVOT VTI:    0.219 m  AORTA Ao Root diam: 3.00 cm Ao Asc diam:  3.00 cm MITRAL VALVE MV Area (PHT): 2.95 cm     SHUNTS MV Decel Time: 257 msec     Systemic VTI:  0.22 m MV E velocity: 62.90 cm/s   Systemic Diam: 2.00 cm MV A velocity: 115.00 cm/s MV E/A ratio:  0.55 Armanda Magic MD Electronically signed by Armanda Magic MD Signature Date/Time: 11/13/2023/9:43:24 AM    Final    MR BRAIN WO CONTRAST Result Date: 11/13/2023 CLINICAL DATA:  Acute neurologic deficit.  Left facial droop. EXAM: MRI HEAD WITHOUT CONTRAST TECHNIQUE: Multiplanar, multiecho pulse sequences of the brain and surrounding structures were obtained without intravenous contrast. COMPARISON:  None Available. FINDINGS: Brain: No acute infarct, mass effect or extra-axial collection. No acute or chronic hemorrhage. There is multifocal hyperintense T2-weighted signal within the white matter. Generalized volume loss. The midline structures are normal. Vascular: Normal flow voids. Skull and upper cervical spine: Normal  calvarium and skull base. Visualized upper cervical spine and soft tissues are normal. Sinuses/Orbits:No paranasal sinus fluid levels or advanced mucosal thickening. No mastoid or middle ear effusion. Normal orbits. IMPRESSION: 1. No acute intracranial abnormality. 2. Findings of chronic small vessel ischemia and volume loss. Electronically Signed   By: Deatra Robinson M.D.   On: 11/13/2023 01:05   CT ANGIO HEAD NECK W WO CM Result Date: 11/12/2023 CLINICAL DATA:  Left facial droop and confusion EXAM: CT ANGIOGRAPHY HEAD AND NECK WITH AND WITHOUT CONTRAST TECHNIQUE: Multidetector CT imaging of the head and neck was performed using the standard protocol during bolus administration of intravenous contrast. Multiplanar CT image reconstructions and MIPs were obtained to evaluate the vascular anatomy. Carotid stenosis measurements (when applicable) are obtained utilizing NASCET criteria, using the distal internal carotid diameter as the denominator. RADIATION DOSE REDUCTION: This exam was performed according to the departmental dose-optimization program which includes automated exposure control, adjustment of the mA and/or kV according to patient size and/or use of iterative reconstruction technique. CONTRAST:  75mL OMNIPAQUE IOHEXOL 350 MG/ML SOLN COMPARISON:  None Available. FINDINGS: CTA NECK FINDINGS Skeleton: No acute  abnormality or high grade bony spinal canal stenosis. Other neck: Normal pharynx, larynx and major salivary glands. No cervical lymphadenopathy. Unremarkable thyroid gland. Upper chest: No pneumothorax or pleural effusion. No nodules or masses. Aortic arch: There is no calcific atherosclerosis of the aortic arch. Conventional 3 vessel aortic branching pattern. RIGHT carotid system: Normal without aneurysm, dissection or stenosis. LEFT carotid system: Normal without aneurysm, dissection or stenosis. Vertebral arteries: Left dominant configuration. There is no dissection, occlusion or flow-limiting  stenosis to the skull base (V1-V3 segments). CTA HEAD FINDINGS POSTERIOR CIRCULATION: Vertebral arteries are normal. No proximal occlusion of the anterior or inferior cerebellar arteries. Basilar artery is normal. Superior cerebellar arteries are normal. Posterior cerebral arteries are normal. ANTERIOR CIRCULATION: Intracranial internal carotid arteries are normal. Anterior cerebral arteries are normal. Middle cerebral arteries are normal. Venous sinuses: As permitted by contrast timing, patent. Anatomic variants: None Review of the MIP images confirms the above findings. IMPRESSION: 1. No emergent large vessel occlusion or high-grade stenosis of the intracranial arteries. 2. Normal CTA of the neck. Electronically Signed   By: Deatra Robinson M.D.   On: 11/12/2023 22:02   CT HEAD CODE STROKE WO CONTRAST Result Date: 11/12/2023 CLINICAL DATA:  Code stroke. 88 year old female. Left side deficit. EXAM: CT HEAD WITHOUT CONTRAST TECHNIQUE: Contiguous axial images were obtained from the base of the skull through the vertex without intravenous contrast. RADIATION DOSE REDUCTION: This exam was performed according to the departmental dose-optimization program which includes automated exposure control, adjustment of the mA and/or kV according to patient size and/or use of iterative reconstruction technique. COMPARISON:  Head CT 08/31/2023, 05/05/2022.  Brain MRI 10/01/2018. FINDINGS: Brain: Stable cerebral volume, chronic ventricular prominence since 2023. No midline shift, mass effect, or evidence of intracranial mass lesion. No acute intracranial hemorrhage identified. Confluent chronic periventricular white matter hypodensity. Stable gray-white matter differentiation throughout the brain. No cortically based acute infarct identified. Deep gray nuclei, posterior fossa appears stable and negative. Vascular: Calcified atherosclerosis at the skull base. No suspicious intracranial vascular hyperdensity. Skull: Intact.  No  acute osseous abnormality identified. Sinuses/Orbits: Mild bubbly opacity in the left sphenoid sinus, not significantly changed and other Visualized paranasal sinuses and mastoids are stable and well aerated. Other: No gaze deviation. No acute orbit or scalp soft tissue finding. ASPECTS [facility] Stroke Program Early CT Score) Total score (0-10 with 10 being normal): 10 IMPRESSION: 1. ASPECTS 10. Stable non contrast CT appearance of the brain since 2023. 2. These results were communicated to Dr. Selina Cooley at 3:42 pm on 11/12/2023 by text page via the [facility] messaging system. Electronically Signed   By: Odessa Fleming M.D.   On: 11/12/2023 15:43    Microbiology: No results found for this or any previous visit (from the past 240 hours).   Labs: Basic Metabolic Panel: Recent Labs  Lab 11/12/23 1527 11/12/23 1532 11/13/23 0702  NA 142 141 140  K 4.1 4.1 3.8  CL 104 104 105  CO2 25  --  25  GLUCOSE 104* 101* 89  BUN 13 16 10   CREATININE 0.84 0.80 0.76  CALCIUM 9.3  --  8.8*   Liver Function Tests: Recent Labs  Lab 11/12/23 1527 11/13/23 0702  AST 24 22  ALT 20 22  ALKPHOS 44 43  BILITOT 1.1 1.7*  PROT 6.2* 6.8  ALBUMIN 3.5 3.8   No results for input(s): "LIPASE", "AMYLASE" in the last 168 hours. No results for input(s): "AMMONIA" in the last 168 hours. CBC: Recent Labs  Lab  11/12/23 1527 11/12/23 1532 11/13/23 0702  WBC 8.0  --  9.0  NEUTROABS 5.6  --   --   HGB 15.8* 16.0* 15.8*  HCT 49.3* 47.0* 48.4*  MCV 97.4  --  94.9  PLT 233  --  239   Cardiac Enzymes: No results for input(s): "CKTOTAL", "CKMB", "CKMBINDEX", "TROPONINI" in the last 168 hours. BNP: BNP (last 3 results) Recent Labs    11/14/22 1218  BNP 32.1    ProBNP (last 3 results) No results for input(s): "PROBNP" in the last 8760 hours.  CBG: No results for input(s): "GLUCAP" in the last 168 hours.  Signed:  Rhetta Mura MD   [facility] 11/13/2023, 12:06 PM

## 2023-11-13 NOTE — Progress Notes (Addendum)
 STROKE TEAM PROGRESS NOTE   INTERIM HISTORY/SUBJECTIVE Follows with Dr. Sherryll Burger at [facility]. Needs follow up with him outpatient for consideration of starting anticholinergic, given unclear baseline mental status and age, it is risky to start in patient with unclear outpatient follow up.  Patient has been living in assisted living.  She she states her main complaint is her tremors which have increased recently and she states that is the reason why she is in the hospital.  She denies any TIA or strokelike symptoms.  MRI scan does show a left parietal white matter lacunar infarct as well as chronic small vessel disease. OBJECTIVE  CBC    Component Value Date/Time   WBC 9.0 11/13/2023 0702   RBC 5.10 11/13/2023 0702   HGB 15.8 (H) 11/13/2023 0702   HGB 13.9 01/01/2014 0418   HCT 48.4 (H) 11/13/2023 0702   HCT 42.8 01/01/2014 0418   PLT 239 11/13/2023 0702   PLT 191 01/01/2014 0418   MCV 94.9 11/13/2023 0702   MCV 93 01/01/2014 0418   MCH 31.0 11/13/2023 0702   MCHC 32.6 11/13/2023 0702   RDW 14.6 11/13/2023 0702   RDW 13.6 01/01/2014 0418   LYMPHSABS 1.3 11/12/2023 1527   LYMPHSABS 1.5 01/01/2014 0418   MONOABS 0.7 11/12/2023 1527   MONOABS 0.7 01/01/2014 0418   EOSABS 0.3 11/12/2023 1527   EOSABS 0.3 01/01/2014 0418   BASOSABS 0.1 11/12/2023 1527   BASOSABS 0.1 01/01/2014 0418    BMET    Component Value Date/Time   NA 141 11/12/2023 1532   NA 141 12/31/2013 1511   K 4.1 11/12/2023 1532   K 4.0 12/31/2013 1511   CL 104 11/12/2023 1532   CL 108 (H) 12/31/2013 1511   CO2 25 11/12/2023 1527   CO2 30 12/31/2013 1511   GLUCOSE 101 (H) 11/12/2023 1532   GLUCOSE 96 12/31/2013 1511   BUN 16 11/12/2023 1532   BUN 14 12/31/2013 1511   CREATININE 0.80 11/12/2023 1532   CREATININE 0.64 12/31/2013 1511   CALCIUM 9.3 11/12/2023 1527   CALCIUM 8.9 12/31/2013 1511   GFRNONAA >60 11/12/2023 1527   GFRNONAA >60 12/31/2013 1511    IMAGING past 24 hours MR BRAIN WO  CONTRAST Result Date: 11/13/2023 CLINICAL DATA:  Acute neurologic deficit.  Left facial droop. EXAM: MRI HEAD WITHOUT CONTRAST TECHNIQUE: Multiplanar, multiecho pulse sequences of the brain and surrounding structures were obtained without intravenous contrast. COMPARISON:  None Available. FINDINGS: Brain: No acute infarct, mass effect or extra-axial collection. No acute or chronic hemorrhage. There is multifocal hyperintense T2-weighted signal within the white matter. Generalized volume loss. The midline structures are normal. Vascular: Normal flow voids. Skull and upper cervical spine: Normal calvarium and skull base. Visualized upper cervical spine and soft tissues are normal. Sinuses/Orbits:No paranasal sinus fluid levels or advanced mucosal thickening. No mastoid or middle ear effusion. Normal orbits. IMPRESSION: 1. No acute intracranial abnormality. 2. Findings of chronic small vessel ischemia and volume loss. Electronically Signed   By: Deatra Robinson M.D.   On: 11/13/2023 01:05   CT ANGIO HEAD NECK W WO CM Result Date: 11/12/2023 CLINICAL DATA:  Left facial droop and confusion EXAM: CT ANGIOGRAPHY HEAD AND NECK WITH AND WITHOUT CONTRAST TECHNIQUE: Multidetector CT imaging of the head and neck was performed using the standard protocol during bolus administration of intravenous contrast. Multiplanar CT image reconstructions and MIPs were obtained to evaluate the vascular anatomy. Carotid stenosis measurements (when applicable) are obtained utilizing NASCET criteria, using the distal  internal carotid diameter as the denominator. RADIATION DOSE REDUCTION: This exam was performed according to the departmental dose-optimization program which includes automated exposure control, adjustment of the mA and/or kV according to patient size and/or use of iterative reconstruction technique. CONTRAST:  75mL OMNIPAQUE IOHEXOL 350 MG/ML SOLN COMPARISON:  None Available. FINDINGS: CTA NECK FINDINGS Skeleton: No acute  abnormality or high grade bony spinal canal stenosis. Other neck: Normal pharynx, larynx and major salivary glands. No cervical lymphadenopathy. Unremarkable thyroid gland. Upper chest: No pneumothorax or pleural effusion. No nodules or masses. Aortic arch: There is no calcific atherosclerosis of the aortic arch. Conventional 3 vessel aortic branching pattern. RIGHT carotid system: Normal without aneurysm, dissection or stenosis. LEFT carotid system: Normal without aneurysm, dissection or stenosis. Vertebral arteries: Left dominant configuration. There is no dissection, occlusion or flow-limiting stenosis to the skull base (V1-V3 segments). CTA HEAD FINDINGS POSTERIOR CIRCULATION: Vertebral arteries are normal. No proximal occlusion of the anterior or inferior cerebellar arteries. Basilar artery is normal. Superior cerebellar arteries are normal. Posterior cerebral arteries are normal. ANTERIOR CIRCULATION: Intracranial internal carotid arteries are normal. Anterior cerebral arteries are normal. Middle cerebral arteries are normal. Venous sinuses: As permitted by contrast timing, patent. Anatomic variants: None Review of the MIP images confirms the above findings. IMPRESSION: 1. No emergent large vessel occlusion or high-grade stenosis of the intracranial arteries. 2. Normal CTA of the neck. Electronically Signed   By: Deatra Robinson M.D.   On: 11/12/2023 22:02   CT HEAD CODE STROKE WO CONTRAST Result Date: 11/12/2023 CLINICAL DATA:  Code stroke. 88 year old female. Left side deficit. EXAM: CT HEAD WITHOUT CONTRAST TECHNIQUE: Contiguous axial images were obtained from the base of the skull through the vertex without intravenous contrast. RADIATION DOSE REDUCTION: This exam was performed according to the departmental dose-optimization program which includes automated exposure control, adjustment of the mA and/or kV according to patient size and/or use of iterative reconstruction technique. COMPARISON:  Head CT  08/31/2023, 05/05/2022.  Brain MRI 10/01/2018. FINDINGS: Brain: Stable cerebral volume, chronic ventricular prominence since 2023. No midline shift, mass effect, or evidence of intracranial mass lesion. No acute intracranial hemorrhage identified. Confluent chronic periventricular white matter hypodensity. Stable gray-white matter differentiation throughout the brain. No cortically based acute infarct identified. Deep gray nuclei, posterior fossa appears stable and negative. Vascular: Calcified atherosclerosis at the skull base. No suspicious intracranial vascular hyperdensity. Skull: Intact.  No acute osseous abnormality identified. Sinuses/Orbits: Mild bubbly opacity in the left sphenoid sinus, not significantly changed and other Visualized paranasal sinuses and mastoids are stable and well aerated. Other: No gaze deviation. No acute orbit or scalp soft tissue finding. ASPECTS [facility] Stroke Program Early CT Score) Total score (0-10 with 10 being normal): 10 IMPRESSION: 1. ASPECTS 10. Stable non contrast CT appearance of the brain since 2023. 2. These results were communicated to Dr. Selina Cooley at 3:42 pm on 11/12/2023 by text page via the [facility] messaging system. Electronically Signed   By: Odessa Fleming M.D.   On: 11/12/2023 15:43    Vitals:   11/12/23 2150 11/12/23 2339 11/13/23 0316 11/13/23 0804  BP: (!) 155/120 (!) 132/98 (!) 167/113 (!) 152/103  Pulse:  84 89 78  Resp:  18 18   Temp:  98 F (36.7 C) 98.4 F (36.9 C) 97.6 F (36.4 C)  TempSrc:  Oral  Oral  SpO2:   95% 95%  Weight:      Height:         PHYSICAL EXAM General:  Alert,  well-nourished, well-developed patient in no acute distress Psych:  Mood and affect appropriate for situation CV: Regular rate and rhythm on monitor Respiratory:  Regular, unlabored respirations on room air GI: Abdomen soft and nontender   NEURO:  Mental Status: AA&Ox3, patient diminished attention registration and recall. Speech/Language: speech is without  dysarthria or aphasia.  Naming, repetition, fluency, and comprehension intact.On MOCA scored 14/26 Decreased facial expression.  Positive glabellar tap Cranial Nerves:  II: PERRL. Visual fields full.  III, IV, VI: EOMI. Eyelids elevate symmetrically.  V: Sensation is intact to light touch and symmetrical to face.  VII: Face is symmetrical resting and smiling VIII: hearing intact to voice. IX, X: Palate elevates symmetrically. Phonation is normal.  ZO:XWRUEAVW shrug 5/5. XII: tongue is midline without fasciculations. Motor: 5/5 strength to all muscle groups tested.  Has resting right greater than left upper extremity tremor which improves with position holding and action.  Cogwheel rigidity is present at both wrist upon activation.. Tone: is normal and bulk is normal Sensation- Intact to light touch bilaterally.  Coordination: Resting tremor, positive cogwhweeling  Gait- deferred  ASSESSMENT/PLAN  Ms. MELYSSA SIGNOR is a 88 y.o. female with history of  HTN, Hypothyroidism, parkinson disease, CHF and hx of ovarian cancer presenting from left facial droop and left eye pain.  NIH on Admission 1  ?  Transient left facial droop  ? TIA .worsening tremors likely due to Parkinson's disease and baseline cognitive impairment  Code Stroke CT head No acute abnormality. ASPECTS 10.    CTA head & neck No LVO MRI  No acute abnormality  2D Echo EF 60-65% LDL 155 HgbA1c 5.3 VTE prophylaxis -SCDs aspirin 81 mg daily prior to admission, now on aspirin 81 mg daily and clopidogrel 75 mg daily for 3 weeks and then plavix alone. Therapy recommendations:  SNF Disposition: Return to SNF  CHF Hypertension Home meds: Carvedilol, losartan Blood Pressure Goal: BP less than 220/110   Hyperlipidemia LDL 155, goal < 70 Consider leqvio Statin allergy  Other Stroke Risk Factors Obesity, Body mass index is 35.15 kg/m., BMI >/= 30 associated with increased stroke risk, recommend weight loss, diet and  exercise as appropriate   Other Active Problems Parkinson's disease Currently managed by Dr. Sherryll Burger and [facility] Meds: Gabapentin, requip Consider starting anticholinergic agent - Atrane 0.5mg  BID (1mg  is the lowest dose available inpatient, will need to follow up with Dr. Sherryll Burger and PCP for management )  Hospital day # 0  Patient seen and examined by NP/APP with MD. MD to update note as needed.   Susan Picker, DNP, FNP-BC [facility] Pager: [phone]  I have personally obtained history,examined this patient, reviewed notes, independently viewed imaging studies, participated in medical decision making and plan of care.ROS completed by me personally and pertinent positives fully documented  I have made any additions or clarifications directly to the above note. Agree with note above.  Patient is not a great historian and feels she is hearing mostly due to worsening of tremors which appear to be parkinsonian in nature.  Denies any focal neurological symptoms but apparently of brought in for evaluation of facial droop which appears to have resolved and MRI is negative for acute stroke.  Patient does take some Requip for restless legs which she is already having hallucinations and cognitive impairment would not recommend increasing Requip or adding levodopa at this time.  Consider low-dose trihexyphenidyl to help with parkinsonian tremor but this will have to be titrated and followed  closely by her outpatient neurologist after discharge.  No family available at the bedside.  Discussed with Dr. Mahala Menghini.  Greater than 50% time during this 50-minute visit was spent in counseling coordination of care about a possible TIA versus parkinsonian tremor discussion with patient and care team and answering questions.  Delia Heady, MD Medical Director [facility] Pager: [phone] 11/13/2023 1:38 PM   To contact Stroke Continuity provider, please refer to [email or web address]. After  hours, contact General Neurology

## 2023-11-13 NOTE — Plan of Care (Signed)

## 2023-11-13 NOTE — Evaluation (Signed)
 Speech Language Pathology Evaluation Patient Details Name: Susan Salas MRN: [id number] DOB: 10/02/1935 Today's Date: 11/13/2023 Time: 4098-1191 SLP Time Calculation (min) (ACUTE ONLY): 28 min  Problem List:  Patient Active Problem List   Diagnosis Date Noted   Focal neurological deficit 11/12/2023   Morbid obesity (HCC) 05/15/2022   Other chronic pain 10/10/2021   Athscl heart disease of native coronary artery w/o ang pctrs 07/24/2021   Hyperlipidemia, unspecified 07/24/2021   Closed compression fracture of L4 lumbar vertebra, initial encounter (HCC) 02/26/2021   Low back pain 02/26/2021   Chronic diastolic CHF (congestive heart failure) (HCC) 02/26/2021   Parkinsonism (HCC) 02/26/2021   Hypertension    Osteoporosis    Hypothyroidism    Idiopathic peripheral neuropathy 01/24/2020   OSA (obstructive sleep apnea) 03/17/2019   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   Hypertension    Hypothyroidism    Parkinson disease (HCC)    PONV (postoperative nausea and vomiting)    Spinal headache    with labor of 81 year old son.   Past Surgical History:  Past Surgical History:  Procedure Laterality Date   ABDOMINAL HYSTERECTOMY     AMPUTATION TOE Left 05/02/2017   Procedure: AMPUTATION TOE/Left 4th [id number];  Surgeon: Linus Galas, DPM;  Location: [facility] ORS;  Service: Podiatry;  Laterality: Left;   APPENDECTOMY     BACK SURGERY  2014   lamenectomy   BREAST SURGERY Bilateral 2003   reduction   CATARACT EXTRACTION W/ INTRAOCULAR LENS  IMPLANT Bilateral 2014   CHOLECYSTECTOMY     COLON SURGERY  2017   FRACTURE SURGERY Right 2003   screws & plates   KYPHOPLASTY N/A 02/26/2021   Procedure: L4 KYPHOPLASTY;  Surgeon: Kennedy Bucker, MD;  Location: [facility] ORS;  Service: Orthopedics;  Laterality: N/A;   TONSILLECTOMY     HPI:  Susan Salas is a 88 y.o. female with medical history significant of hypertension, hyperlipidemia, hypothyroidism, chronic diastolic HF, CAD, neuropathy, obesity, OSA, parkinsonism, ovarian cancer, breast cancer, SBO, bowel perforation, chronic pain presenting as a code stroke from rehab facility on 11/12/23.     History obtained with assistance of chart review.  Patient was noted by staff at [facility] to have left facial droop and was reporting pain behind her left eye.  They also noted that she seemed to have a worsening tremor from her baseline (which she has in the setting of parkinsonism). Son reports over the last several weeks he noticed some worsening delirium and confusion/possible hallucinations; MRI negative for acute findings.  ST consulted for speech/language cognitive assessment.   Assessment / Plan / Recommendation Clinical Impression  Pt assessed via portions of the [employer] Mental Status Examination (SLUMS) with an overall score obtained of 14/26 (typical score on this assessment is 27/30) with writing portion eliminated d/t R hand tremoring at baseline (exacerbated this hospitalization) and pt declined this portion of the assessment stating decreased legibility impacts writing.  No family available during this assessment to determine baseline functioning.  Pt stated "my children  say my memory is not good."  Pt exhibited deficits in the areas of sustained attention, orientation and memory recall/decreased retrieval of new information.  Ox2 (self and place accurate), but time/situation were incorrect.  Pt voiced "seeing children in the room" with medication side effect of hallucinations  for restless leg syndrome noted by neurologist who was present during a portion of the evaluation.  Pt with potential decreased safety awareness as she utilized call bell prior to SLP arriving, but then stated she "was getting ready to get back in the bed" when SLP asked if she needed further assistance prior to departure.  Pt with cumulative decreased response time/processing information when answering personal information questions.  She did provide past medical hx/personal information which was accurate during this assessment.  Speech was intelligible within simple conversation, but slightly decreased vocal intensity noted.  OME revealed mandibular/lingual tremoring during volitional tasks such as lingual protrusion/lateralization.  Recommend f/u at next venue of care for cognitive deficits listed above.  ST will s/o in acute setting.    SLP Assessment  SLP Recommendation/Assessment: All further Speech Language Pathology  needs can be addressed in the next venue of care SLP Visit Diagnosis: Cognitive communication deficit (R41.841);Attention and concentration deficit Attention and concentration deficit following: Other cerebrovascular disease    Recommendations for follow up therapy are one component of a multi-disciplinary discharge planning process, led by the attending physician.  Recommendations may be updated based on patient status, additional functional criteria and insurance authorization.    Follow Up Recommendations  Skilled nursing-short term rehab (<3 hours/day)    Assistance Recommended at Discharge  Frequent or constant Supervision/Assistance  Functional Status Assessment Patient has had a recent decline in their functional status and demonstrates the ability to make significant improvements in function in a reasonable and predictable amount of time.  Frequency and Duration   Evaluation only        SLP Evaluation Cognition  Overall Cognitive Status: No family/caregiver present  to determine baseline cognitive functioning Arousal/Alertness: Awake/alert Orientation Level: Oriented to person;Oriented to place;Disoriented to time;Disoriented to situation Year: Other (Comment) (1925) Day of Week: Incorrect Attention: Sustained Sustained Attention: Impaired Sustained Attention Impairment: Verbal basic;Functional basic Memory: Impaired Memory Impairment: Decreased short term memory;Decreased recall of new information;Retrieval deficit Decreased Short Term Memory: Verbal basic;Functional basic Awareness: Impaired Awareness Impairment: Anticipatory impairment;Other (comment) (safety) Problem Solving: Impaired Problem Solving Impairment: Verbal basic;Functional basic Behaviors: Perseveration Safety/Judgment: Impaired Comments: Pt stated "I am going to get up and into the bed when you leave" needed reminders for use of call bell       Comprehension  Auditory Comprehension Overall Auditory Comprehension: Impaired at baseline Yes/No Questions: Within Functional Limits Commands: Not tested Conversation: Simple Interfering Components: Attention;Processing speed EffectiveTechniques: Repetition Visual Recognition/Discrimination Discrimination: Not tested Reading Comprehension Reading Status: Not tested    Expression Expression Primary Mode of Expression: Verbal Verbal Expression Overall Verbal Expression: Appears within functional limits for tasks assessed Level of Generative/Spontaneous Verbalization: Conversation Naming: Not tested Interfering Components: Premorbid deficit Non-Verbal Means of Communication: Not applicable Written Expression Dominant Hand: Right Written Expression: Unable to assess (comment) (tremoring)   Oral / Motor  Oral Motor/Sensory Function Overall Oral Motor/Sensory Function: Other (comment) (slight lingual/mandibular tremoring) Motor Speech Overall Motor Speech: Appears within functional limits for tasks assessed Respiration: Within  functional limits Phonation: Low vocal intensity Resonance: Within functional limits Articulation: Within functional limitis Intelligibility: Intelligible Motor Planning: Witnin functional limits Motor Speech Errors: Not applicable Interfering Components: Premorbid status Effective Techniques:  Increased vocal intensity            Pat Abayomi Pattison,M.S.,CCC-SLP 11/13/2023, 11:27 AM

## 2023-11-13 NOTE — TOC Transition Note (Signed)
 Transition of Care [facility]) - Discharge Note   Patient Details  Name: Susan Salas MRN: [id number] Date of Birth: [birth year]  Transition of Care [facility]) CM/SW Contact:  Baldemar Lenis, LCSW Phone Number: 11/13/2023, 1:22 PM   Clinical Narrative:   CSW confirmed that patient is from [facility], will return today. CSW spoke with son, Nadine Counts, he is in agreement. CSW sent discharge information to [facility], confirmed bed is available. Transport arranged with [facility] for next available.  Nurse to call report to [phone], Room 902.    Final next level of care: Skilled Nursing Facility Barriers to Discharge: Barriers Resolved   Patient Goals and CMS Choice Patient states their goals for this hospitalization and ongoing recovery are:: patient unable to participate in goal setting, not fully oriented CMS Medicare.gov Compare Post Acute Care list provided to:: Patient Represenative (must comment) Choice offered to / list presented to : Adult Children [place] ownership interest in [facility].provided to:: Adult Children    Discharge Placement              Patient chooses bed at: [facility] Patient to be transferred to facility by: [facility] Name of family member notified: Nadine Counts Patient and family notified of of transfer: 11/13/23  Discharge Plan and Services Additional resources added to the After Visit Summary for                                       Social Drivers of Health (SDOH) Interventions SDOH Screenings   Food Insecurity: No Food Insecurity (11/13/2023)  Housing: Low Risk  (11/13/2023)  Transportation Needs: No Transportation Needs (11/13/2023)  Utilities: Not At Risk (11/13/2023)  Social Connections: Moderately Isolated (11/13/2023)  Tobacco Use: Medium Risk (11/12/2023)     Readmission Risk Interventions     No data to display

## 2023-11-13 NOTE — Evaluation (Signed)
 Occupational Therapy Evaluation Patient Details Name: Susan Salas MRN: [id number] DOB: October 14, 1935 Today's Date: 11/13/2023   History of Present Illness   Susan Salas is a 88 y.o. female presenting as a code stroke from rehab facility. Past medical history significant of hypertension, hyperlipidemia, hypothyroidism, chronic diastolic HF, CAD, neuropathy, obesity, OSA, parkinsonism, ovarian cancer, breast cancer, SBO, bowel perforation, chronic pain.     Clinical Impressions PTA patient receiving extensive assist with adls in her ALF.  Currently patient appears to be at or near baseline with abilities with adls.  She admitted to feeling very stressed and tired which exacerbates her tremors- moderate to severe tremors noted in both hands that reduced with cueing for adl activity.  Patient may benefit from continues OT in her ALF.  Recommending return to [facility] with follow up OT to ensure she is at baseline.      If plan is discharge home, recommend the following:   Two people to help with walking and/or transfers;Two people to help with bathing/dressing/bathroom;Assistance with cooking/housework;Direct supervision/assist for medications management;Direct supervision/assist for financial management;Help with stairs or ramp for entrance;Supervision due to cognitive status     Functional Status Assessment   Patient has had a recent decline in their functional status and demonstrates the ability to make significant improvements in function in a reasonable and predictable amount of time.     Equipment Recommendations   None recommended by OT     Recommendations for Other Services         Precautions/Restrictions   Precautions Precautions: Fall Recall of Precautions/Restrictions: Intact Restrictions Weight Bearing Restrictions Per Provider Order: No     Mobility Bed Mobility               General bed mobility comments: In chair upon arrival    Transfers                           Balance   Sitting-balance support: Feet supported, No upper extremity supported Sitting balance-Leahy Scale: Fair                                     ADL either performed or assessed with clinical judgement   ADL Overall ADL's : Needs assistance/impaired Eating/Feeding: Moderate assistance   Grooming: Wash/dry hands;Wash/dry face;Minimal assistance   Upper Body Bathing: Moderate assistance   Lower Body Bathing: Maximal assistance   Upper Body Dressing : Moderate assistance   Lower Body Dressing: Maximal assistance   Toilet Transfer: +2 for physical assistance           Functional mobility during ADLs: Maximal assistance General ADL Comments: Adls done with staff at [facility] place     Vision Baseline Vision/History: 0 No visual deficits;1 Wears glasses       Perception         Praxis         Pertinent Vitals/Pain Pain Assessment Pain Assessment: No/denies pain     Extremity/Trunk Assessment Upper Extremity Assessment Upper Extremity Assessment: RUE deficits/detail;LUE deficits/detail RUE Deficits / Details: Tremors that increase when asked to do grooming task LUE Deficits / Details: Tremors   Lower Extremity Assessment Lower Extremity Assessment: Defer to PT evaluation       Communication Communication Communication: No apparent difficulties   Cognition Arousal: Alert Behavior During Therapy: [facility] for tasks assessed/performed  OT - Cognition Comments: Very focused on getting back to [place] and upset with her daughter.  needing min redirection to stay on task                 Following commands: Impaired Following commands impaired: Follows one step commands inconsistently, Follows one step commands with increased time     Cueing  General Comments   Cueing Techniques: Verbal cues;Tactile cues  No family present during evaluation    Exercises     Shoulder Instructions       Home Living Family/patient expects to be discharged to:: Skilled nursing facility  Available Help at Discharge: Other (Comment) Type of Home: Other(Comment)                           Additional Comments: Pt. requires assist and has for awhile at the alf      Prior Functioning/Environment Prior Level of Function : Needs assist;History of Falls (last six months)               ADLs Comments: Staff assist    OT Problem List:     OT Treatment/Interventions:        OT Goals(Current goals can be found in the care plan section)   Acute Rehab OT Goals Patient Stated Goal: To go home   OT Frequency:       Co-evaluation              AM-PAC OT "6 Clicks" Daily Activity     Outcome Measure Help from another person eating meals?: A Little Help from another person taking care of personal grooming?: A Little Help from another person toileting, which includes using toliet, bedpan, or urinal?: A Lot Help from another person bathing (including washing, rinsing, drying)?: A Lot Help from another person to put on and taking off regular upper body clothing?: A Lot Help from another person to put on and taking off regular lower body clothing?: A Lot 6 Click Score: 14   End of Session Nurse Communication: Mobility status  Activity Tolerance: Patient tolerated treatment well Patient left: in chair;with call bell/phone within reach;with chair alarm set  OT Visit Diagnosis: Unsteadiness on feet (R26.81);Ataxia, unspecified (R27.0);Muscle weakness (generalized) (M62.81) (Tremors)                Time: 7829-5621 OT Time Calculation (min): 28 min Charges:  OT General Charges $OT Visit: 1 Visit OT Evaluation $OT Eval Low Complexity: 1 Low OT Treatments $Self Care/Home Management : 8-22 mins  Hal Neer OTR/L   Malachi Bonds 11/13/2023, 2:26 PM

## 2023-11-13 NOTE — Progress Notes (Signed)
 Report called to [facility] LPN at [facility]. Will await PTAR arrival.

## 2023-11-13 NOTE — Plan of Care (Signed)
  Problem: Coping: Goal: Will verbalize positive feelings about self Outcome: Progressing   Problem: Education: Goal: Knowledge of General Education information will improve Description: Including pain rating scale, medication(s)/side effects and non-pharmacologic comfort measures Outcome: Progressing   Problem: Clinical Measurements: Goal: Ability to maintain clinical measurements within normal limits will improve Outcome: Progressing   Problem: Elimination: Goal: Will not experience complications related to bowel motility Outcome: Progressing   Problem: Pain Managment: Goal: General experience of comfort will improve and/or be controlled Outcome: Progressing   Problem: Safety: Goal: Ability to remain free from injury will improve 11/13/2023 0429 by Avie Arenas, RN Outcome: Progressing 11/13/2023 0426 by Avie Arenas, RN Outcome: Progressing   Problem: Skin Integrity: Goal: Risk for impaired skin integrity will decrease Outcome: Progressing

## 2023-11-13 NOTE — Progress Notes (Signed)
 Pt discharged back to [facility] via PT[facility] in stable condition. All belongings in the room sent with pt. Report called to [facility] LPN at facility

## 2023-11-13 NOTE — Evaluation (Signed)
 Physical Therapy Evaluation Patient Details Name: Susan Salas MRN: [id number] DOB: 02/15/1936 Today's Date: 11/13/2023  History of Present Illness  Susan Salas is a 88 y.o. female presenting as a code stroke from rehab facility. Past medical history significant of hypertension, hyperlipidemia, hypothyroidism, chronic diastolic HF, CAD, neuropathy, obesity, OSA, parkinsonism, ovarian cancer, breast cancer, SBO, bowel perforation, chronic pain.  Clinical Impression  Pt presents with admitting diagnosis above. Pt today was able to transfer to chair with Max A via stand pivot. Pt presents from SNF and reports that she was mostly WC bound however was able to transfer and ambulate short distance with assistance from rehab staff. Recommend pt return to SNF upon DC. PT will continue to follow.         If plan is discharge home, recommend the following: Two people to help with walking and/or transfers;A lot of help with bathing/dressing/bathroom;Direct supervision/assist for medications management;Assist for transportation;Help with stairs or ramp for entrance;Supervision due to cognitive status;Assistance with cooking/housework   Can travel by private vehicle   No    Equipment Recommendations None recommended by PT  Recommendations for Other Services       Functional Status Assessment Patient has had a recent decline in their functional status and demonstrates the ability to make significant improvements in function in a reasonable and predictable amount of time.     Precautions / Restrictions Precautions Precautions: Fall Recall of Precautions/Restrictions: Intact Restrictions Weight Bearing Restrictions Per Provider Order: No      Mobility  Bed Mobility Overal bed mobility: Needs Assistance Bed Mobility: Supine to Sit     Supine to sit: Mod assist     General bed mobility comments: Mod A to scoot hips and elevate trunk.    Transfers Overall transfer level: Needs  assistance Equipment used: 1 person hand held assist Transfers: Sit to/from Stand, Bed to chair/wheelchair/BSC Sit to Stand: Mod assist, Max assist Stand pivot transfers: Max assist         General transfer comment: Cues for hand placement and upright posture. Pt required 5 attempts to make it to chair due to heavy posterior lean upon standing.    Ambulation/Gait               General Gait Details: deferred  Stairs            Wheelchair Mobility     Tilt Bed    Modified Rankin (Stroke Patients Only)       Balance Overall balance assessment: Needs assistance Sitting-balance support: Bilateral upper extremity supported, Feet supported Sitting balance-Leahy Scale: Fair     Standing balance support: Bilateral upper extremity supported, During functional activity Standing balance-Leahy Scale: Poor Standing balance comment: Reliant on external support                             Pertinent Vitals/Pain Pain Assessment Pain Assessment: No/denies pain    Home Living Family/patient expects to be discharged to:: Skilled nursing facility   Available Help at Discharge: Other (Comment) (ALF; prn (may need higher level of care)) Type of Home: Other(Comment) (ALF)             Additional Comments: Pt reports mostly using a WC however just recently started walking with PT    Prior Function Prior Level of Function : Needs assist;History of Falls (last six months) (1 fall last week)  Mobility Comments: Pt reports mostly using a WC at baseline with assistance for transfer ADLs Comments: Staff assist     Extremity/Trunk Assessment   Upper Extremity Assessment Upper Extremity Assessment: Generalized weakness    Lower Extremity Assessment Lower Extremity Assessment: Generalized weakness    Cervical / Trunk Assessment Cervical / Trunk Assessment: Normal  Communication   Communication Communication: No apparent difficulties     Cognition Arousal: Alert Behavior During Therapy: WFL for tasks assessed/performed   PT - Cognitive impairments: Orientation, History of cognitive impairments   Orientation impairments: Time, Situation                   PT - Cognition Comments: Disoriented to time and place. Following commands: Impaired Following commands impaired: Follows one step commands inconsistently, Follows one step commands with increased time     Cueing Cueing Techniques: Verbal cues, Tactile cues     General Comments General comments (skin integrity, edema, etc.): BP: 141/98 seated    Exercises     Assessment/Plan    PT Assessment Patient needs continued PT services  PT Problem List Decreased strength;Decreased range of motion;Decreased activity tolerance;Decreased balance;Decreased mobility;Decreased coordination;Decreased cognition;Decreased knowledge of use of DME;Decreased safety awareness;Decreased knowledge of precautions;Cardiopulmonary status limiting activity       PT Treatment Interventions DME instruction;Stair training;Gait training;Functional mobility training;Therapeutic activities;Therapeutic exercise;Balance training;Neuromuscular re-education;Cognitive remediation;Patient/family education    PT Goals (Current goals can be found in the Care Plan section)  Acute Rehab PT Goals Patient Stated Goal: to get better PT Goal Formulation: With patient Time For Goal Achievement: 11/27/23 Potential to Achieve Goals: Fair    Frequency Min 2X/week     Co-evaluation               AM-PAC PT "6 Clicks" Mobility  Outcome Measure Help needed turning from your back to your side while in a flat bed without using bedrails?: A Little Help needed moving from lying on your back to sitting on the side of a flat bed without using bedrails?: A Lot Help needed moving to and from a bed to a chair (including a wheelchair)?: A Lot Help needed standing up from a chair using your arms (e.g.,  wheelchair or bedside chair)?: A Lot Help needed to walk in hospital room?: Total Help needed climbing 3-5 steps with a railing? : Total 6 Click Score: 11    End of Session Equipment Utilized During Treatment: Gait belt Activity Tolerance: Patient tolerated treatment well Patient left: in chair;with call bell/phone within reach;with chair alarm set Nurse Communication: Mobility status;Need for lift equipment PT Visit Diagnosis: Other abnormalities of gait and mobility (R26.89)    Time: 1610-9604 PT Time Calculation (min) (ACUTE ONLY): 29 min   Charges:   PT Evaluation $PT Eval Moderate Complexity: 1 Mod PT Treatments $Therapeutic Activity: 8-22 mins PT General Charges $$ ACUTE PT VISIT: 1 Visit         Shela Nevin, PT, DPT Acute Rehab Services [phone]   Gladys Damme 11/13/2023, 11:52 AM

## 2023-11-13 NOTE — NC FL2 (Signed)
 [place] MEDICAID FL2 LEVEL OF CARE FORM     IDENTIFICATION  Patient Name: Susan Salas Birthdate: October 13, 1935 Sex: female Admission Date (Current Location): 11/12/2023  [facility] and [place] Number:  Producer, television/film/video and Address:  [facility], [address] [place], [place] [address]      Provider Number: [id number]  Attending Physician Name and Address:  Rhetta Mura, MD  Relative Name and Phone Number:  Nadine Counts [phone]    Current Level of Care: Hospital Recommended Level of Care: Skilled Nursing Facility Prior Approval Number:    Date Approved/Denied:   PASRR Number:    Discharge Plan: SNF    Current Diagnoses: Patient Active Problem List   Diagnosis Date Noted   Focal neurological deficit 11/12/2023   Morbid obesity (HCC) 05/15/2022   Other chronic pain 10/10/2021   Athscl heart disease of native coronary artery w/o ang pctrs 07/24/2021   Hyperlipidemia, unspecified 07/24/2021   Closed compression fracture of L4 lumbar vertebra, initial encounter (HCC) 02/26/2021   Low back pain 02/26/2021   Chronic diastolic CHF (congestive heart failure) (HCC) 02/26/2021   Parkinsonism (HCC) 02/26/2021   Hypertension    Osteoporosis    Hypothyroidism    Idiopathic peripheral neuropathy 01/24/2020   OSA (obstructive sleep apnea) 03/17/2019   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) 09/23/2017   Amputated toe of left foot (HCC) 05/29/2017   Aromatase inhibitor use 10/03/2016   Lumbar stenosis with neurogenic claudication 04/10/2015   Bowel perforation (HCC) 01/11/2014    Orientation RESPIRATION BLADDER Height & Weight     Self, Place    Incontinent Weight: 95.8 kg Height:  5\' 5"  (165.1 cm)  BEHAVIORAL SYMPTOMS/MOOD NEUROLOGICAL BOWEL NUTRITION STATUS      Continent Diet (Heart Healthy)  AMBULATORY STATUS COMMUNICATION OF NEEDS Skin   Total Care (wheel chair bound at baseline) Verbally                          Personal Care Assistance Level of Assistance  Bathing, Dressing Bathing Assistance: Maximum assistance   Dressing Assistance: Maximum assistance     Functional Limitations Info             SPECIAL CARE FACTORS FREQUENCY                       Contractures      Additional Factors Info  Code Status (DNR)               Current Medications (11/13/2023):  This is the current hospital active medication list Current Facility-Administered Medications  Medication Dose Route Frequency Provider Last Rate Last Admin    stroke: early stages of recovery book   Does not apply Once Synetta Fail, MD       acetaminophen (TYLENOL) tablet 1,000 mg  1,000 mg Oral Q8H PRN John Giovanni, MD   1,000 mg at 11/13/23 0003   aspirin chewable tablet 81 mg  81 mg Oral Daily Synetta Fail, MD   81 mg at 11/13/23 1001   clopidogrel (PLAVIX) tablet 75 mg  75 mg Oral Daily Synetta Fail, MD   75 mg at 11/13/23 1000   DULoxetine (CYMBALTA) DR capsule 60 mg  60 mg Oral Daily Synetta Fail, MD   60 mg at 11/13/23 7846   gabapentin (NEURONTIN) capsule 600 mg  600 mg Oral TID Synetta Fail, MD   600  mg at 11/13/23 1001   levothyroxine (SYNTHROID) tablet 88 mcg  88 mcg Oral Q0600 Synetta Fail, MD   88 mcg at 11/13/23 0551   rOPINIRole (REQUIP) tablet 0.25 mg  0.25 mg Oral Daily Synetta Fail, MD   0.25 mg at 11/13/23 1002     Discharge Medications: Please see discharge summary for a list of discharge medications.  Relevant Imaging Results:  Relevant Lab Results:   Additional Information    Cherre Blanc, RN

## 2023-11-13 NOTE — Progress Notes (Signed)
  Echocardiogram 2D Echocardiogram has been performed.  Leda Roys RDCS 11/13/2023, 9:26 AM

## 2023-11-17 LAB — GLUCOSE, CAPILLARY: Glucose-Capillary: 116 mg/dL — ABNORMAL HIGH (ref 70–99)
